# Patient Record
Sex: Male | Born: 1945 | Race: Black or African American | Hispanic: No | Smoking: Former smoker
Health system: Southern US, Community
[De-identification: ages and names within clinical notes are randomized; demographics above are authoritative.]

## PROBLEM LIST (undated history)

## (undated) DIAGNOSIS — I10 Essential (primary) hypertension: Secondary | ICD-10-CM

## (undated) DIAGNOSIS — M5136 Other intervertebral disc degeneration, lumbar region: Secondary | ICD-10-CM

## (undated) DIAGNOSIS — M51369 Other intervertebral disc degeneration, lumbar region without mention of lumbar back pain or lower extremity pain: Secondary | ICD-10-CM

## (undated) DIAGNOSIS — F39 Unspecified mood [affective] disorder: Secondary | ICD-10-CM

## (undated) DIAGNOSIS — M1711 Unilateral primary osteoarthritis, right knee: Secondary | ICD-10-CM

## (undated) DIAGNOSIS — E119 Type 2 diabetes mellitus without complications: Secondary | ICD-10-CM

## (undated) DIAGNOSIS — N281 Cyst of kidney, acquired: Secondary | ICD-10-CM

## (undated) DIAGNOSIS — H269 Unspecified cataract: Secondary | ICD-10-CM

## (undated) DIAGNOSIS — K589 Irritable bowel syndrome without diarrhea: Secondary | ICD-10-CM

## (undated) DIAGNOSIS — E785 Hyperlipidemia, unspecified: Secondary | ICD-10-CM

## (undated) DIAGNOSIS — K219 Gastro-esophageal reflux disease without esophagitis: Secondary | ICD-10-CM

## (undated) DIAGNOSIS — K279 Peptic ulcer, site unspecified, unspecified as acute or chronic, without hemorrhage or perforation: Secondary | ICD-10-CM

## (undated) DIAGNOSIS — N183 Chronic kidney disease, stage 3 unspecified: Secondary | ICD-10-CM

## (undated) DIAGNOSIS — N400 Enlarged prostate without lower urinary tract symptoms: Secondary | ICD-10-CM

## (undated) HISTORY — DX: Type 2 diabetes mellitus without complications: E11.9

## (undated) HISTORY — DX: Unilateral primary osteoarthritis, right knee: M17.11

## (undated) HISTORY — DX: Irritable bowel syndrome, unspecified: K58.9

## (undated) HISTORY — DX: Hyperlipidemia, unspecified: E78.5

## (undated) HISTORY — DX: Chronic kidney disease, stage 3 unspecified: N18.30

## (undated) HISTORY — DX: Benign prostatic hyperplasia without lower urinary tract symptoms: N40.0

## (undated) HISTORY — DX: Peptic ulcer, site unspecified, unspecified as acute or chronic, without hemorrhage or perforation: K27.9

## (undated) HISTORY — PX: PROSTATE SURGERY: SHX751

## (undated) HISTORY — DX: Unspecified mood (affective) disorder: F39

## (undated) HISTORY — DX: Gastro-esophageal reflux disease without esophagitis: K21.9

## (undated) HISTORY — DX: Other intervertebral disc degeneration, lumbar region without mention of lumbar back pain or lower extremity pain: M51.369

## (undated) HISTORY — DX: Essential (primary) hypertension: I10

## (undated) HISTORY — DX: Cyst of kidney, acquired: N28.1

## (undated) HISTORY — DX: Other intervertebral disc degeneration, lumbar region: M51.36

## (undated) HISTORY — DX: Unspecified cataract: H26.9

---

## 2011-04-19 DIAGNOSIS — H25099 Other age-related incipient cataract, unspecified eye: Secondary | ICD-10-CM | POA: Diagnosis not present

## 2011-04-19 DIAGNOSIS — E119 Type 2 diabetes mellitus without complications: Secondary | ICD-10-CM | POA: Diagnosis not present

## 2011-04-19 DIAGNOSIS — H524 Presbyopia: Secondary | ICD-10-CM | POA: Diagnosis not present

## 2011-04-19 DIAGNOSIS — H5200 Hypermetropia, unspecified eye: Secondary | ICD-10-CM | POA: Diagnosis not present

## 2011-08-20 DIAGNOSIS — R3129 Other microscopic hematuria: Secondary | ICD-10-CM | POA: Diagnosis not present

## 2011-08-20 DIAGNOSIS — R350 Frequency of micturition: Secondary | ICD-10-CM | POA: Diagnosis not present

## 2011-08-20 DIAGNOSIS — N400 Enlarged prostate without lower urinary tract symptoms: Secondary | ICD-10-CM | POA: Diagnosis not present

## 2011-08-29 DIAGNOSIS — N401 Enlarged prostate with lower urinary tract symptoms: Secondary | ICD-10-CM | POA: Diagnosis not present

## 2011-08-29 DIAGNOSIS — R350 Frequency of micturition: Secondary | ICD-10-CM | POA: Diagnosis not present

## 2011-11-12 DIAGNOSIS — R071 Chest pain on breathing: Secondary | ICD-10-CM | POA: Diagnosis not present

## 2011-11-12 DIAGNOSIS — M999 Biomechanical lesion, unspecified: Secondary | ICD-10-CM | POA: Diagnosis not present

## 2011-11-15 HISTORY — PX: COLONOSCOPY: SHX174

## 2011-11-19 DIAGNOSIS — S298XXA Other specified injuries of thorax, initial encounter: Secondary | ICD-10-CM | POA: Diagnosis not present

## 2011-11-19 DIAGNOSIS — K279 Peptic ulcer, site unspecified, unspecified as acute or chronic, without hemorrhage or perforation: Secondary | ICD-10-CM | POA: Diagnosis not present

## 2011-11-19 DIAGNOSIS — E119 Type 2 diabetes mellitus without complications: Secondary | ICD-10-CM | POA: Diagnosis not present

## 2011-11-19 DIAGNOSIS — I10 Essential (primary) hypertension: Secondary | ICD-10-CM | POA: Diagnosis not present

## 2011-11-26 DIAGNOSIS — Z86010 Personal history of colonic polyps: Secondary | ICD-10-CM | POA: Diagnosis not present

## 2011-12-10 DIAGNOSIS — H269 Unspecified cataract: Secondary | ICD-10-CM | POA: Insufficient documentation

## 2011-12-10 DIAGNOSIS — E119 Type 2 diabetes mellitus without complications: Secondary | ICD-10-CM | POA: Diagnosis not present

## 2011-12-26 DIAGNOSIS — Z23 Encounter for immunization: Secondary | ICD-10-CM | POA: Diagnosis not present

## 2012-02-21 DIAGNOSIS — I10 Essential (primary) hypertension: Secondary | ICD-10-CM | POA: Diagnosis not present

## 2012-02-21 DIAGNOSIS — K279 Peptic ulcer, site unspecified, unspecified as acute or chronic, without hemorrhage or perforation: Secondary | ICD-10-CM | POA: Diagnosis not present

## 2012-02-21 DIAGNOSIS — E785 Hyperlipidemia, unspecified: Secondary | ICD-10-CM | POA: Diagnosis not present

## 2012-02-21 DIAGNOSIS — E119 Type 2 diabetes mellitus without complications: Secondary | ICD-10-CM | POA: Diagnosis not present

## 2012-02-21 DIAGNOSIS — Z0000 Encounter for general adult medical examination without abnormal findings: Secondary | ICD-10-CM | POA: Diagnosis not present

## 2012-03-03 DIAGNOSIS — E119 Type 2 diabetes mellitus without complications: Secondary | ICD-10-CM | POA: Diagnosis not present

## 2012-03-03 DIAGNOSIS — M205X9 Other deformities of toe(s) (acquired), unspecified foot: Secondary | ICD-10-CM | POA: Diagnosis not present

## 2012-03-03 DIAGNOSIS — B353 Tinea pedis: Secondary | ICD-10-CM | POA: Diagnosis not present

## 2012-03-03 DIAGNOSIS — M79609 Pain in unspecified limb: Secondary | ICD-10-CM | POA: Diagnosis not present

## 2012-03-03 DIAGNOSIS — M2060 Acquired deformities of toe(s), unspecified, unspecified foot: Secondary | ICD-10-CM | POA: Insufficient documentation

## 2012-03-31 DIAGNOSIS — M79609 Pain in unspecified limb: Secondary | ICD-10-CM | POA: Diagnosis not present

## 2012-03-31 DIAGNOSIS — M205X9 Other deformities of toe(s) (acquired), unspecified foot: Secondary | ICD-10-CM | POA: Diagnosis not present

## 2012-03-31 DIAGNOSIS — E119 Type 2 diabetes mellitus without complications: Secondary | ICD-10-CM | POA: Diagnosis not present

## 2012-03-31 DIAGNOSIS — B353 Tinea pedis: Secondary | ICD-10-CM | POA: Diagnosis not present

## 2012-06-23 DIAGNOSIS — H6690 Otitis media, unspecified, unspecified ear: Secondary | ICD-10-CM | POA: Diagnosis not present

## 2012-09-05 DIAGNOSIS — L218 Other seborrheic dermatitis: Secondary | ICD-10-CM | POA: Diagnosis not present

## 2012-09-05 DIAGNOSIS — E119 Type 2 diabetes mellitus without complications: Secondary | ICD-10-CM | POA: Diagnosis not present

## 2012-12-04 DIAGNOSIS — Z23 Encounter for immunization: Secondary | ICD-10-CM | POA: Diagnosis not present

## 2012-12-04 DIAGNOSIS — E119 Type 2 diabetes mellitus without complications: Secondary | ICD-10-CM | POA: Diagnosis not present

## 2012-12-04 DIAGNOSIS — R7309 Other abnormal glucose: Secondary | ICD-10-CM | POA: Diagnosis not present

## 2012-12-04 DIAGNOSIS — E785 Hyperlipidemia, unspecified: Secondary | ICD-10-CM | POA: Diagnosis not present

## 2012-12-08 DIAGNOSIS — N401 Enlarged prostate with lower urinary tract symptoms: Secondary | ICD-10-CM | POA: Diagnosis not present

## 2012-12-08 DIAGNOSIS — R350 Frequency of micturition: Secondary | ICD-10-CM | POA: Diagnosis not present

## 2013-01-05 DIAGNOSIS — H2510 Age-related nuclear cataract, unspecified eye: Secondary | ICD-10-CM | POA: Diagnosis not present

## 2013-01-05 DIAGNOSIS — E119 Type 2 diabetes mellitus without complications: Secondary | ICD-10-CM | POA: Diagnosis not present

## 2013-01-13 DIAGNOSIS — J019 Acute sinusitis, unspecified: Secondary | ICD-10-CM | POA: Diagnosis not present

## 2013-02-04 DIAGNOSIS — Z23 Encounter for immunization: Secondary | ICD-10-CM | POA: Diagnosis not present

## 2013-02-19 DIAGNOSIS — Z79899 Other long term (current) drug therapy: Secondary | ICD-10-CM | POA: Diagnosis not present

## 2013-02-19 DIAGNOSIS — R7309 Other abnormal glucose: Secondary | ICD-10-CM | POA: Diagnosis not present

## 2013-02-19 DIAGNOSIS — E785 Hyperlipidemia, unspecified: Secondary | ICD-10-CM | POA: Diagnosis not present

## 2013-02-19 DIAGNOSIS — E119 Type 2 diabetes mellitus without complications: Secondary | ICD-10-CM | POA: Diagnosis not present

## 2013-02-24 DIAGNOSIS — Z0000 Encounter for general adult medical examination without abnormal findings: Secondary | ICD-10-CM | POA: Diagnosis not present

## 2013-02-24 DIAGNOSIS — E785 Hyperlipidemia, unspecified: Secondary | ICD-10-CM | POA: Diagnosis not present

## 2013-02-24 DIAGNOSIS — I10 Essential (primary) hypertension: Secondary | ICD-10-CM | POA: Diagnosis not present

## 2013-02-24 DIAGNOSIS — E119 Type 2 diabetes mellitus without complications: Secondary | ICD-10-CM | POA: Diagnosis not present

## 2013-03-20 DIAGNOSIS — J209 Acute bronchitis, unspecified: Secondary | ICD-10-CM | POA: Diagnosis not present

## 2013-03-23 DIAGNOSIS — R350 Frequency of micturition: Secondary | ICD-10-CM | POA: Diagnosis not present

## 2013-03-23 DIAGNOSIS — N401 Enlarged prostate with lower urinary tract symptoms: Secondary | ICD-10-CM | POA: Diagnosis not present

## 2013-04-13 DIAGNOSIS — Z01818 Encounter for other preprocedural examination: Secondary | ICD-10-CM | POA: Diagnosis not present

## 2013-04-13 DIAGNOSIS — N401 Enlarged prostate with lower urinary tract symptoms: Secondary | ICD-10-CM | POA: Diagnosis not present

## 2013-04-13 DIAGNOSIS — N400 Enlarged prostate without lower urinary tract symptoms: Secondary | ICD-10-CM | POA: Diagnosis not present

## 2013-04-20 DIAGNOSIS — N138 Other obstructive and reflux uropathy: Secondary | ICD-10-CM | POA: Diagnosis not present

## 2013-04-20 DIAGNOSIS — E119 Type 2 diabetes mellitus without complications: Secondary | ICD-10-CM | POA: Diagnosis not present

## 2013-04-20 DIAGNOSIS — E785 Hyperlipidemia, unspecified: Secondary | ICD-10-CM | POA: Diagnosis not present

## 2013-04-20 DIAGNOSIS — I10 Essential (primary) hypertension: Secondary | ICD-10-CM | POA: Diagnosis not present

## 2013-04-20 DIAGNOSIS — N401 Enlarged prostate with lower urinary tract symptoms: Secondary | ICD-10-CM | POA: Diagnosis not present

## 2013-04-20 DIAGNOSIS — N139 Obstructive and reflux uropathy, unspecified: Secondary | ICD-10-CM | POA: Diagnosis not present

## 2013-04-20 DIAGNOSIS — K219 Gastro-esophageal reflux disease without esophagitis: Secondary | ICD-10-CM | POA: Diagnosis not present

## 2013-04-21 DIAGNOSIS — N139 Obstructive and reflux uropathy, unspecified: Secondary | ICD-10-CM | POA: Diagnosis not present

## 2013-04-21 DIAGNOSIS — N401 Enlarged prostate with lower urinary tract symptoms: Secondary | ICD-10-CM | POA: Diagnosis not present

## 2013-04-21 DIAGNOSIS — E119 Type 2 diabetes mellitus without complications: Secondary | ICD-10-CM | POA: Diagnosis not present

## 2013-04-21 DIAGNOSIS — K219 Gastro-esophageal reflux disease without esophagitis: Secondary | ICD-10-CM | POA: Diagnosis not present

## 2013-04-21 DIAGNOSIS — I10 Essential (primary) hypertension: Secondary | ICD-10-CM | POA: Diagnosis not present

## 2013-04-21 DIAGNOSIS — E785 Hyperlipidemia, unspecified: Secondary | ICD-10-CM | POA: Diagnosis not present

## 2013-04-22 DIAGNOSIS — N139 Obstructive and reflux uropathy, unspecified: Secondary | ICD-10-CM | POA: Diagnosis not present

## 2013-04-22 DIAGNOSIS — N401 Enlarged prostate with lower urinary tract symptoms: Secondary | ICD-10-CM | POA: Diagnosis not present

## 2013-04-22 DIAGNOSIS — E785 Hyperlipidemia, unspecified: Secondary | ICD-10-CM | POA: Diagnosis not present

## 2013-04-22 DIAGNOSIS — E119 Type 2 diabetes mellitus without complications: Secondary | ICD-10-CM | POA: Diagnosis not present

## 2013-04-22 DIAGNOSIS — I10 Essential (primary) hypertension: Secondary | ICD-10-CM | POA: Diagnosis not present

## 2013-04-22 DIAGNOSIS — K219 Gastro-esophageal reflux disease without esophagitis: Secondary | ICD-10-CM | POA: Diagnosis not present

## 2013-05-13 DIAGNOSIS — N138 Other obstructive and reflux uropathy: Secondary | ICD-10-CM | POA: Diagnosis not present

## 2013-05-13 DIAGNOSIS — N401 Enlarged prostate with lower urinary tract symptoms: Secondary | ICD-10-CM | POA: Diagnosis not present

## 2013-07-02 DIAGNOSIS — R7309 Other abnormal glucose: Secondary | ICD-10-CM | POA: Diagnosis not present

## 2013-07-02 DIAGNOSIS — IMO0001 Reserved for inherently not codable concepts without codable children: Secondary | ICD-10-CM | POA: Diagnosis not present

## 2013-07-08 DIAGNOSIS — N401 Enlarged prostate with lower urinary tract symptoms: Secondary | ICD-10-CM | POA: Diagnosis not present

## 2013-07-10 DIAGNOSIS — K5732 Diverticulitis of large intestine without perforation or abscess without bleeding: Secondary | ICD-10-CM | POA: Diagnosis not present

## 2013-09-09 DIAGNOSIS — E119 Type 2 diabetes mellitus without complications: Secondary | ICD-10-CM | POA: Diagnosis not present

## 2013-09-09 DIAGNOSIS — R42 Dizziness and giddiness: Secondary | ICD-10-CM | POA: Diagnosis not present

## 2013-09-09 DIAGNOSIS — H6690 Otitis media, unspecified, unspecified ear: Secondary | ICD-10-CM | POA: Diagnosis not present

## 2013-09-09 DIAGNOSIS — M62838 Other muscle spasm: Secondary | ICD-10-CM | POA: Diagnosis not present

## 2013-12-31 DIAGNOSIS — IMO0001 Reserved for inherently not codable concepts without codable children: Secondary | ICD-10-CM | POA: Diagnosis not present

## 2014-01-11 DIAGNOSIS — H2510 Age-related nuclear cataract, unspecified eye: Secondary | ICD-10-CM | POA: Diagnosis not present

## 2014-01-11 DIAGNOSIS — E119 Type 2 diabetes mellitus without complications: Secondary | ICD-10-CM | POA: Diagnosis not present

## 2014-01-13 DIAGNOSIS — R351 Nocturia: Secondary | ICD-10-CM | POA: Diagnosis not present

## 2014-01-13 DIAGNOSIS — N138 Other obstructive and reflux uropathy: Secondary | ICD-10-CM | POA: Diagnosis not present

## 2014-01-13 DIAGNOSIS — N401 Enlarged prostate with lower urinary tract symptoms: Secondary | ICD-10-CM | POA: Diagnosis not present

## 2014-01-28 DIAGNOSIS — Z23 Encounter for immunization: Secondary | ICD-10-CM | POA: Diagnosis not present

## 2014-02-11 DIAGNOSIS — M5136 Other intervertebral disc degeneration, lumbar region: Secondary | ICD-10-CM | POA: Diagnosis not present

## 2014-03-08 DIAGNOSIS — A499 Bacterial infection, unspecified: Secondary | ICD-10-CM | POA: Diagnosis not present

## 2014-03-08 DIAGNOSIS — J329 Chronic sinusitis, unspecified: Secondary | ICD-10-CM | POA: Diagnosis not present

## 2014-04-14 DIAGNOSIS — R351 Nocturia: Secondary | ICD-10-CM | POA: Diagnosis not present

## 2014-04-14 DIAGNOSIS — N400 Enlarged prostate without lower urinary tract symptoms: Secondary | ICD-10-CM | POA: Diagnosis not present

## 2014-04-14 DIAGNOSIS — N529 Male erectile dysfunction, unspecified: Secondary | ICD-10-CM | POA: Diagnosis not present

## 2014-04-15 DIAGNOSIS — M1711 Unilateral primary osteoarthritis, right knee: Secondary | ICD-10-CM | POA: Diagnosis not present

## 2014-06-01 DIAGNOSIS — E860 Dehydration: Secondary | ICD-10-CM | POA: Diagnosis not present

## 2014-06-01 DIAGNOSIS — I10 Essential (primary) hypertension: Secondary | ICD-10-CM | POA: Diagnosis not present

## 2014-06-01 DIAGNOSIS — R002 Palpitations: Secondary | ICD-10-CM | POA: Diagnosis not present

## 2014-06-01 DIAGNOSIS — R42 Dizziness and giddiness: Secondary | ICD-10-CM | POA: Diagnosis not present

## 2014-06-01 DIAGNOSIS — Z79899 Other long term (current) drug therapy: Secondary | ICD-10-CM | POA: Diagnosis not present

## 2014-06-01 DIAGNOSIS — Z5181 Encounter for therapeutic drug level monitoring: Secondary | ICD-10-CM | POA: Diagnosis not present

## 2014-06-01 DIAGNOSIS — R0602 Shortness of breath: Secondary | ICD-10-CM | POA: Diagnosis not present

## 2014-06-01 DIAGNOSIS — R079 Chest pain, unspecified: Secondary | ICD-10-CM | POA: Diagnosis not present

## 2014-06-01 DIAGNOSIS — R000 Tachycardia, unspecified: Secondary | ICD-10-CM | POA: Diagnosis not present

## 2014-06-01 DIAGNOSIS — E119 Type 2 diabetes mellitus without complications: Secondary | ICD-10-CM | POA: Diagnosis not present

## 2014-06-04 DIAGNOSIS — R002 Palpitations: Secondary | ICD-10-CM | POA: Diagnosis not present

## 2014-09-15 DIAGNOSIS — E785 Hyperlipidemia, unspecified: Secondary | ICD-10-CM | POA: Diagnosis not present

## 2014-09-15 DIAGNOSIS — E1165 Type 2 diabetes mellitus with hyperglycemia: Secondary | ICD-10-CM | POA: Diagnosis not present

## 2014-09-16 DIAGNOSIS — M5136 Other intervertebral disc degeneration, lumbar region: Secondary | ICD-10-CM | POA: Diagnosis not present

## 2014-11-02 DIAGNOSIS — M170 Bilateral primary osteoarthritis of knee: Secondary | ICD-10-CM | POA: Diagnosis not present

## 2014-11-02 DIAGNOSIS — M25561 Pain in right knee: Secondary | ICD-10-CM | POA: Diagnosis not present

## 2015-01-10 DIAGNOSIS — H2513 Age-related nuclear cataract, bilateral: Secondary | ICD-10-CM | POA: Diagnosis not present

## 2015-01-10 DIAGNOSIS — E119 Type 2 diabetes mellitus without complications: Secondary | ICD-10-CM | POA: Diagnosis not present

## 2015-02-16 DIAGNOSIS — Z23 Encounter for immunization: Secondary | ICD-10-CM | POA: Diagnosis not present

## 2015-03-03 ENCOUNTER — Encounter: Payer: Self-pay | Admitting: Internal Medicine

## 2015-03-12 ENCOUNTER — Encounter: Payer: Self-pay | Admitting: Internal Medicine

## 2015-03-12 ENCOUNTER — Other Ambulatory Visit: Payer: Self-pay | Admitting: Internal Medicine

## 2015-03-12 DIAGNOSIS — E119 Type 2 diabetes mellitus without complications: Secondary | ICD-10-CM

## 2015-03-12 DIAGNOSIS — E785 Hyperlipidemia, unspecified: Secondary | ICD-10-CM | POA: Insufficient documentation

## 2015-03-12 DIAGNOSIS — K279 Peptic ulcer, site unspecified, unspecified as acute or chronic, without hemorrhage or perforation: Secondary | ICD-10-CM | POA: Insufficient documentation

## 2015-03-12 DIAGNOSIS — E1169 Type 2 diabetes mellitus with other specified complication: Secondary | ICD-10-CM | POA: Insufficient documentation

## 2015-03-12 DIAGNOSIS — E118 Type 2 diabetes mellitus with unspecified complications: Secondary | ICD-10-CM | POA: Insufficient documentation

## 2015-03-12 DIAGNOSIS — N400 Enlarged prostate without lower urinary tract symptoms: Secondary | ICD-10-CM | POA: Insufficient documentation

## 2015-03-12 DIAGNOSIS — N529 Male erectile dysfunction, unspecified: Secondary | ICD-10-CM | POA: Insufficient documentation

## 2015-03-12 DIAGNOSIS — L210 Seborrhea capitis: Secondary | ICD-10-CM | POA: Insufficient documentation

## 2015-03-12 DIAGNOSIS — N138 Other obstructive and reflux uropathy: Secondary | ICD-10-CM | POA: Insufficient documentation

## 2015-03-12 DIAGNOSIS — K589 Irritable bowel syndrome without diarrhea: Secondary | ICD-10-CM | POA: Insufficient documentation

## 2015-03-12 DIAGNOSIS — I10 Essential (primary) hypertension: Secondary | ICD-10-CM | POA: Insufficient documentation

## 2015-03-31 DIAGNOSIS — E1165 Type 2 diabetes mellitus with hyperglycemia: Secondary | ICD-10-CM | POA: Diagnosis not present

## 2015-04-08 ENCOUNTER — Ambulatory Visit (INDEPENDENT_AMBULATORY_CARE_PROVIDER_SITE_OTHER): Payer: Medicare Other | Admitting: Internal Medicine

## 2015-04-08 ENCOUNTER — Encounter: Payer: Self-pay | Admitting: Internal Medicine

## 2015-04-08 VITALS — BP 110/60 | HR 88 | Temp 97.4°F | Ht 71.0 in | Wt 199.0 lb

## 2015-04-08 DIAGNOSIS — J0110 Acute frontal sinusitis, unspecified: Secondary | ICD-10-CM | POA: Diagnosis not present

## 2015-04-08 DIAGNOSIS — H109 Unspecified conjunctivitis: Secondary | ICD-10-CM

## 2015-04-08 MED ORDER — NEOMYCIN-POLYMYXIN-DEXAMETH 3.5-10000-0.1 OP SUSP: 2.0000 [drp] | Freq: Four times a day (QID) | OPHTHALMIC | Status: DC

## 2015-04-08 MED ORDER — AMOXICILLIN-POT CLAVULANATE 875-125 MG PO TABS: 1.0000 | ORAL_TABLET | Freq: Two times a day (BID) | ORAL | Status: DC

## 2015-04-08 NOTE — Progress Notes (Signed)
Date:  04/08/2015   Name:  Marc Bond   DOB:  [birth year]   MRN:  [id number]   Chief Complaint: Sinusitis and Cough Sinusitis This is a new problem. The current episode started in the past 7 days. The problem is unchanged. There has been no fever. Associated symptoms include congestion, coughing, sinus pressure and swollen glands. Pertinent negatives include no ear pain or sore throat. Past treatments include nothing.  Cough Associated symptoms include eye redness and postnasal drip. Pertinent negatives include no chest pain, ear pain, fever, sore throat or wheezing.  Conjunctivitis  The current episode started yesterday. The onset was sudden. The problem occurs continuously. The problem is moderate. Associated symptoms include congestion, swollen glands, cough, eye discharge and eye redness. Pertinent negatives include no fever, no ear pain, no hearing loss, no sore throat and no wheezing.    Review of Systems  Constitutional: Negative for fever.  HENT: Positive for congestion, postnasal drip and sinus pressure. Negative for ear pain, hearing loss, sore throat, tinnitus and trouble swallowing.   Eyes: Positive for discharge and redness.  Respiratory: Positive for cough. Negative for choking, chest tightness and wheezing.   Cardiovascular: Negative for chest pain, palpitations and leg swelling.    Patient Active Problem List   Diagnosis Date Noted  . Irritable colon 03/12/2015  . Controlled type 2 diabetes mellitus without complication (HCC) 03/12/2015  . Dyslipidemia 03/12/2015  . Enlarged prostate 03/12/2015  . Essential (primary) hypertension 03/12/2015  . Failure of erection 03/12/2015  . Seborrhea capitis 03/12/2015  . Gastroduodenal ulcer 03/12/2015  . HLD (hyperlipidemia) 12/04/2012  . Acquired deformities of toe 03/03/2012  . Bilateral cataracts 12/10/2011    Prior to Admission medications   Medication Sig Start Date End Date Taking? Authorizing Provider  celecoxib  (CELEBREX) 200 MG capsule Take 1 capsule by mouth daily. 07/15/14  Yes Historical Provider, MD  dicyclomine (BENTYL) 10 MG capsule Take 1 capsule by mouth 3 (three) times daily. 04/05/14  Yes Historical Provider, MD  fluticasone (FLONASE) 50 MCG/ACT nasal spray Place 2 sprays into the nose daily. 06/23/12  Yes Historical Provider, MD  glucose blood (ONETOUCH VERIO) test strip ONETOUCH VERIO (In Vitro Strip) - Historical Medication  1 daily Active   Yes Historical Provider, MD  MULTIPLE VITAMINS PO Take 1 tablet by mouth daily.   Yes Historical Provider, MD  omeprazole (PRILOSEC) 40 MG capsule Take 1 capsule by mouth daily. 06/30/14  Yes Historical Provider, MD  ramipril (ALTACE) 2.5 MG capsule Take 1 capsule by mouth daily. 07/15/14  Yes Historical Provider, MD  simvastatin (ZOCOR) 40 MG tablet Take 1 tablet by mouth at bedtime.   Yes Historical Provider, MD  sitaGLIPtin-metformin (JANUMET) 50-1000 MG tablet Take 1 tablet by mouth 2 (two) times daily.   Yes Historical Provider, MD  tadalafil (CIALIS) 10 MG tablet Take by mouth. 05/22/12  Yes Historical Provider, MD    No Known Allergies  Past Surgical History  Procedure Laterality Date  . Colonoscopy  2013    Social History  Substance Use Topics  . Smoking status: Former Games developer  . Smokeless tobacco: None  . Alcohol Use: No     Medication list has been reviewed and updated.   Physical Exam  Constitutional: He is oriented to person, place, and time. He appears well-developed and well-nourished.  HENT:  Right Ear: External ear and ear canal normal. Tympanic membrane is not erythematous and not retracted.  Left Ear: External ear and ear canal normal. Tympanic  membrane is not erythematous and not retracted.  Nose: Right sinus exhibits maxillary sinus tenderness and frontal sinus tenderness. Left sinus exhibits maxillary sinus tenderness and frontal sinus tenderness.  Mouth/Throat: Uvula is midline and mucous membranes are normal. No oral  lesions. No oropharyngeal exudate or posterior oropharyngeal erythema.  Eyes: Right eye exhibits discharge. Left eye exhibits discharge. Right conjunctiva is injected. Left conjunctiva is injected. Right eye exhibits normal extraocular motion and no nystagmus. Left eye exhibits normal extraocular motion and no nystagmus.  Cardiovascular: Normal rate, regular rhythm and normal heart sounds.   Pulmonary/Chest: Effort normal and breath sounds normal. He has no wheezes. He has no rales.  Lymphadenopathy:    He has no cervical adenopathy.  Neurological: He is alert and oriented to person, place, and time.    BP 110/60 mmHg  Pulse 88  Temp(Src) 97.4 F (36.3 C)  Wt 199 lb (90.266 kg)  SpO2 100%  Assessment and Plan: 1. Acute frontal sinusitis, recurrence not specified Continue fluids - amoxicillin-clavulanate (AUGMENTIN) 875-125 MG tablet; Take 1 tablet by mouth 2 (two) times daily.  Dispense: 20 tablet; Refill: 0  2. Bilateral conjunctivitis - neomycin-polymyxin b-dexamethasone (MAXITROL) 3.5-10000-0.1 SUSP; Place 2 drops into both eyes every 6 (six) hours.  Dispense: 5 mL; Refill: 0   Bari EdwardLaura Keyairra Kolinski, MD [facility]  04/08/2015

## 2015-06-10 ENCOUNTER — Other Ambulatory Visit: Payer: Self-pay | Admitting: Internal Medicine

## 2015-06-10 ENCOUNTER — Telehealth: Payer: Self-pay

## 2015-06-10 MED ORDER — OSELTAMIVIR PHOSPHATE 75 MG PO CAPS: 75.0000 mg | ORAL_CAPSULE | Freq: Two times a day (BID) | ORAL | Status: DC

## 2015-06-10 NOTE — Telephone Encounter (Signed)
Patient girlfriend called in today and states that patients grandson whom shares the same bed with him tested positive for the flu yesterday. They would tamiflu called in for the patient just in case.

## 2015-06-17 ENCOUNTER — Other Ambulatory Visit: Payer: Self-pay | Admitting: Internal Medicine

## 2015-06-24 DIAGNOSIS — M1711 Unilateral primary osteoarthritis, right knee: Secondary | ICD-10-CM | POA: Diagnosis not present

## 2015-06-28 DIAGNOSIS — M1711 Unilateral primary osteoarthritis, right knee: Secondary | ICD-10-CM | POA: Diagnosis not present

## 2015-06-29 ENCOUNTER — Encounter: Payer: Self-pay | Admitting: Internal Medicine

## 2015-09-01 DIAGNOSIS — M1711 Unilateral primary osteoarthritis, right knee: Secondary | ICD-10-CM | POA: Diagnosis not present

## 2015-09-29 ENCOUNTER — Other Ambulatory Visit: Payer: Self-pay | Admitting: Internal Medicine

## 2015-09-29 ENCOUNTER — Ambulatory Visit (INDEPENDENT_AMBULATORY_CARE_PROVIDER_SITE_OTHER): Payer: Medicare Other | Admitting: Internal Medicine

## 2015-09-29 ENCOUNTER — Encounter: Payer: Self-pay | Admitting: Internal Medicine

## 2015-09-29 VITALS — BP 116/76 | HR 84 | Resp 16 | Ht 71.0 in | Wt 196.1 lb

## 2015-09-29 DIAGNOSIS — M1711 Unilateral primary osteoarthritis, right knee: Secondary | ICD-10-CM | POA: Diagnosis not present

## 2015-09-29 DIAGNOSIS — M5136 Other intervertebral disc degeneration, lumbar region: Secondary | ICD-10-CM | POA: Diagnosis not present

## 2015-09-29 DIAGNOSIS — Z0000 Encounter for general adult medical examination without abnormal findings: Secondary | ICD-10-CM | POA: Diagnosis not present

## 2015-09-29 DIAGNOSIS — N400 Enlarged prostate without lower urinary tract symptoms: Secondary | ICD-10-CM | POA: Diagnosis not present

## 2015-09-29 DIAGNOSIS — K279 Peptic ulcer, site unspecified, unspecified as acute or chronic, without hemorrhage or perforation: Secondary | ICD-10-CM

## 2015-09-29 DIAGNOSIS — Z23 Encounter for immunization: Secondary | ICD-10-CM | POA: Diagnosis not present

## 2015-09-29 DIAGNOSIS — E119 Type 2 diabetes mellitus without complications: Secondary | ICD-10-CM | POA: Diagnosis not present

## 2015-09-29 DIAGNOSIS — E785 Hyperlipidemia, unspecified: Secondary | ICD-10-CM

## 2015-09-29 LAB — POC URINALYSIS WITH MICROSCOPIC (NON AUTO)MANUAL RESULT
BILIRUBIN UA: NEGATIVE
Bacteria, UA: 0
Crystals: 0
EPITHELIAL CELLS, URINE PER MICROSCOPY: 0
GLUCOSE UA: NEGATIVE
KETONES UA: NEGATIVE
Leukocytes, UA: NEGATIVE
Mucus, UA: 0
NITRITE UA: NEGATIVE
PH UA: 6.5
Protein, UA: NEGATIVE
RBC: 0 M/uL — AB (ref 4.69–6.13)
Spec Grav, UA: 1.02
UROBILINOGEN UA: 0.2
WBC CASTS UA: 0

## 2015-09-29 NOTE — Patient Instructions (Addendum)
Pneumococcal Conjugate Vaccine (PCV13)  1. Why get vaccinated? Vaccination can protect both children and adults from pneumococcal disease. Pneumococcal disease is caused by bacteria that can spread from person to person through close contact. It can cause ear infections, and it can also lead to more serious infections of the:  Lungs (pneumonia),  Blood (bacteremia), and  Covering of the brain and spinal cord (meningitis). Pneumococcal pneumonia is most common among adults. Pneumococcal meningitis can cause deafness and brain damage, and it kills about 1 child in 10 who get it. Anyone can get pneumococcal disease, but children under 28 years of age and adults 43 years and older, people with certain medical conditions, and cigarette smokers are at the highest risk. Before there was a vaccine, the [place] saw:  more than 700 cases of meningitis,  about 13,000 blood infections,  about 5 million ear infections, and  about 200 deaths in children under 5 each year from pneumococcal disease. Since vaccine became available, severe pneumococcal disease in these children has fallen by 88%. About 18,000 older adults die of pneumococcal disease each year in the [place]. Treatment of pneumococcal infections with penicillin and other drugs is not as effective as it used to be, because some strains of the disease have become resistant to these drugs. This makes prevention of the disease, through vaccination, even more important. 2. PCV13 vaccine Pneumococcal conjugate vaccine (called PCV13) protects against 13 types of pneumococcal bacteria. PCV13 is routinely given to children at 2, 4, 6, and 65-74 months of age. It is also recommended for children and adults 70 to 70 years of age with certain health conditions, and for all adults 64 years of age and older. Your doctor can give you details. 3. Some people should not get this vaccine Anyone who has ever had a life-threatening allergic reaction  to a dose of this vaccine, to an earlier pneumococcal vaccine called PCV7, or to any vaccine containing diphtheria toxoid (for example, DTaP), should not get PCV13. Anyone with a severe allergy to any component of PCV13 should not get the vaccine. Tell your doctor if the person being vaccinated has any severe allergies. If the person scheduled for vaccination is not feeling well, your healthcare provider might decide to reschedule the shot on another day. 4. Risks of a vaccine reaction With any medicine, including vaccines, there is a chance of reactions. These are usually mild and go away on their own, but serious reactions are also possible. Problems reported following PCV13 varied by age and dose in the series. The most common problems reported among children were:  About half became drowsy after the shot, had a temporary loss of appetite, or had redness or tenderness where the shot was given.  About 1 out of 3 had swelling where the shot was given.  About 1 out of 3 had a mild fever, and about 1 in 20 had a fever over 102.55F.  Up to about 8 out of 10 became fussy or irritable. Adults have reported pain, redness, and swelling where the shot was given; also mild fever, fatigue, headache, chills, or muscle pain. Young children who get PCV13 along with inactivated flu vaccine at the same time may be at increased risk for seizures caused by fever. Ask your doctor for more information. Problems that could happen after any vaccine:  People sometimes faint after a medical procedure, including vaccination. Sitting or lying down for about 15 minutes can help prevent fainting, and injuries caused by a fall.  Tell your doctor if you feel dizzy, or have vision changes or ringing in the ears.  Some older children and adults get severe pain in the shoulder and have difficulty moving the arm where a shot was given. This happens very rarely.  Any medication can cause a severe allergic reaction. Such  reactions from a vaccine are very rare, estimated at about 1 in a million doses, and would happen within a few minutes to a few hours after the vaccination. As with any medicine, there is a very small chance of a vaccine causing a serious injury or death. The safety of vaccines is always being monitored. For more information, visit: http://floyd.org/www.cdc.gov/vaccinesafety/ 5. What if there is a serious reaction? What should I look for?  Look for anything that concerns you, such as signs of a severe allergic reaction, very high fever, or unusual behavior. Signs of a severe allergic reaction can include hives, swelling of the face and throat, difficulty breathing, a fast heartbeat, dizziness, and weakness-usually within a few minutes to a few hours after the vaccination. What should I do?  If you think it is a severe allergic reaction or other emergency that can't wait, call 9-1-1 or get the person to the nearest hospital. Otherwise, call your doctor. Reactions should be reported to the Vaccine Adverse Event Reporting System (VAERS). Your doctor should file this report, or you can do it yourself through the VAERS web site at www.vaers.LAgents.nohhs.gov, or by calling 1-562-661-7113. VAERS does not give medical advice. 6. The National Vaccine Injury Compensation Program The [employer] Vaccine Injury Compensation Program (VICP) is a federal program that was created to compensate people who may have been injured by certain vaccines. Persons who believe they may have been injured by a vaccine can learn about the program and about filing a claim by calling 1-(657)623-0557 or visiting the VICP website at SpiritualWord.atwww.hrsa.gov/vaccinecompensation. There is a time limit to file a claim for compensation. 7. How can I learn more?  Ask your healthcare provider. He or she can give you the vaccine package insert or suggest other sources of information.  Call your local or state health department.  Contact the Centers for Disease Control and  Prevention (CDC):  Call 786-411-74581-5201040246 (1-800-CDC-INFO) or  Visit CDC's website at PicCapture.uywww.cdc.gov/vaccines Vaccine Information Statement PCV13 Vaccine (02/18/2014)   This information is not intended to replace advice given to you by your health care provider. Make sure you discuss any questions you have with your health care provider.   Document Released: 01/28/2006 Document Revised: 04/23/2014 Document Reviewed: 02/25/2014 Elsevier Interactive Patient Education 2016 [employer]  Health Maintenance  Topic Date Due  . Janet BerlinETANUS/TDAP  07/12/1963  . ZOSTAVAX  07/11/2004  . PNA vac Low Risk Adult (1 of 2 - PCV13) 07/11/2009  . INFLUENZA VACCINE  11/15/2015  . OPHTHALMOLOGY EXAM  01/10/2016  . HEMOGLOBIN A1C  03/30/2016  . FOOT EXAM  09/28/2016  . COLONOSCOPY  11/25/2021  . Hepatitis C Screening  Addressed

## 2015-09-29 NOTE — Progress Notes (Signed)
Patient: Marc Bond, Male    DOB: Apr 27, 1945, 70 y.o.   MRN: [id number] Visit Date: 09/29/2015  Today's Provider: Bari Edward, MD   Chief Complaint  Patient presents with  . Medicare Wellness  . Diabetes  . Hypertension  . Hyperlipidemia   Subjective:    Annual wellness visit Marc Bond is a 70 y.o. male who presents today for his Subsequent Annual Wellness Visit. He feels well. He reports exercising very little due to OA knee. He reports he is sleeping well.   ----------------------------------------------------------- Diabetes He presents for his follow-up diabetic visit. He has type 2 (followed by [facility]) diabetes mellitus. His disease course has been stable. Pertinent negatives for hypoglycemia include no dizziness or headaches. Pertinent negatives for diabetes include no chest pain and no fatigue. There are no hypoglycemic complications. Current diabetic treatment includes oral agent (dual therapy). He is compliant with treatment most of the time.  Hypertension This is a chronic problem. The current episode started more than 1 year ago. The problem is unchanged. The problem is controlled. Pertinent negatives include no chest pain, headaches, palpitations or shortness of breath. Past treatments include ACE inhibitors. The current treatment provides moderate improvement.  Hyperlipidemia This is a chronic problem. The current episode started more than 1 year ago. The problem is controlled. Recent lipid tests were reviewed and are normal. Pertinent negatives include no chest pain, myalgias or shortness of breath. Current antihyperlipidemic treatment includes statins.  OA Knee - more painful recently but no trauma.  He was seen by Ortho and received a joint injection.  He continues on Celebrex daily, for knee pain and lumbar DDD.  Review of Systems  Constitutional: Negative for fever, chills, diaphoresis, appetite change, fatigue and unexpected weight change.  HENT:  Negative for hearing loss, tinnitus, trouble swallowing and voice change.   Eyes: Negative for visual disturbance.  Respiratory: Negative for cough, choking, chest tightness, shortness of breath and wheezing.   Cardiovascular: Negative for chest pain, palpitations and leg swelling.  Gastrointestinal: Negative for abdominal pain, diarrhea, constipation and blood in stool.  Genitourinary: Negative for dysuria, frequency and difficulty urinating.  Musculoskeletal: Negative for myalgias, back pain and arthralgias.  Skin: Negative for color change and rash.  Neurological: Negative for dizziness, syncope and headaches.  Hematological: Negative for adenopathy.  Psychiatric/Behavioral: Negative for sleep disturbance and dysphoric mood.    Social History   Social History  . Marital Status: Married    Spouse Name: N/A  . Number of Children: N/A  . Years of Education: N/A   Occupational History  . Not on file.   Social History Main Topics  . Smoking status: Former Games developer  . Smokeless tobacco: Not on file  . Alcohol Use: No  . Drug Use: Not on file  . Sexual Activity: Not on file   Other Topics Concern  . Not on file   Social History Narrative    Patient Active Problem List   Diagnosis Date Noted  . Degenerative disc disease, lumbar 09/29/2015  . Irritable colon 03/12/2015  . Controlled type 2 diabetes mellitus without complication (HCC) 03/12/2015  . Dyslipidemia 03/12/2015  . Enlarged prostate 03/12/2015  . Essential (primary) hypertension 03/12/2015  . Failure of erection 03/12/2015  . Seborrhea capitis 03/12/2015  . Gastroduodenal ulcer 03/12/2015  . Acquired deformities of toe 03/03/2012  . Bilateral cataracts 12/10/2011    Past Surgical History  Procedure Laterality Date  . Colonoscopy  2013    His family history includes Hypertension in  his sister.    Previous Medications   CELECOXIB (CELEBREX) 200 MG CAPSULE    TAKE ONE CAPSULE BY MOUTH EVERY DAY   DICYCLOMINE  (BENTYL) 10 MG CAPSULE    take 1 capsule by mouth three times a day   GLUCOSE BLOOD (ONETOUCH VERIO) TEST STRIP    ONETOUCH VERIO (In Vitro Strip) - Historical Medication  1 daily Active   MULTIPLE VITAMINS PO    Take 1 tablet by mouth daily.   OMEPRAZOLE (PRILOSEC) 40 MG CAPSULE    TAKE ONE CAPSULE BY MOUTH EVERY DAY   RAMIPRIL (ALTACE) 2.5 MG CAPSULE    Take 1 capsule by mouth daily.   SIMVASTATIN (ZOCOR) 40 MG TABLET    Take 1 tablet by mouth at bedtime.   SITAGLIPTIN-METFORMIN (JANUMET) 50-1000 MG TABLET    Take 1 tablet by mouth 2 (two) times daily.   TADALAFIL (CIALIS) 10 MG TABLET    Take by mouth.    Patient Care Team: Jeronimo NormaNicole Jelesoff as Physician Assistant (Endocrinology)     Objective:   Vitals: BP 116/76 mmHg  Pulse 84  Resp 16  Ht 5\' 11"  (1.803 m)  Wt 196 lb 1.6 oz (88.95 kg)  BMI 27.36 kg/m2  SpO2 98%  Physical Exam  Constitutional: He is oriented to person, place, and time. He appears well-developed and well-nourished.  HENT:  Head: Normocephalic.  Right Ear: Tympanic membrane, external ear and ear canal normal.  Left Ear: Tympanic membrane, external ear and ear canal normal.  Nose: Nose normal.  Mouth/Throat: Uvula is midline and oropharynx is clear and moist.  Eyes: Conjunctivae and EOM are normal. Pupils are equal, round, and reactive to light.  Neck: Normal range of motion. Neck supple. Carotid bruit is not present. No thyromegaly present.  Cardiovascular: Normal rate, regular rhythm, normal heart sounds and intact distal pulses.   Pulmonary/Chest: Effort normal and breath sounds normal. He has no wheezes. Right breast exhibits no mass. Left breast exhibits no mass.  Abdominal: Soft. Normal appearance and bowel sounds are normal. There is no hepatosplenomegaly. There is no tenderness. A hernia (small reducible umbilical hernia) is present.  Musculoskeletal: Normal range of motion.  Lymphadenopathy:    He has no cervical adenopathy.  Neurological: He is  alert and oriented to person, place, and time. He has normal reflexes.  Foot exam:  Normal sensation, skin, and pulses.  Fungal nail thickening both great toes without pain  Skin: Skin is warm, dry and intact.  Psychiatric: He has a normal mood and affect. His speech is normal and behavior is normal. Judgment and thought content normal.  Nursing note and vitals reviewed.   Activities of Daily Living In your present state of health, do you have any difficulty performing the following activities: 09/29/2015 04/08/2015  Hearing? N N  Vision? N N  Difficulty concentrating or making decisions? N N  Walking or climbing stairs? N N  Dressing or bathing? N N  Doing errands, shopping? N N  Preparing Food and eating ? N -  Using the Toilet? N -  In the past six months, have you accidently leaked urine? N -  Do you have problems with loss of bowel control? N -  Managing your Medications? N -  Managing your Finances? N -  Housekeeping or managing your Housekeeping? N -    Fall Risk Assessment Fall Risk  09/29/2015 04/08/2015  Falls in the past year? No No      Depression Screen PHQ 2/9 Scores 09/29/2015  04/08/2015  PHQ - 2 Score 0 0    Cognitive Testing - 6-CIT   Correct? Score   What year is it? yes 0 Yes = 0    No = 4  What month is it? yes 0 Yes = 0    No = 3  Remember:     Floyde Parkins, [address], [place]     What time is it? yes 0 Yes = 0    No = 3  Count backwards from 20 to 1 yes 0 Correct = 0    1 error = 2   More than 1 error = 4  Say the months of the year in reverse. yes 0 Correct = 0    1 error = 2   More than 1 error = 4  What address did I ask you to remember? yes 0 Correct = 0  1 error = 2    2 error = 4    3 error = 6    4 error = 8    All wrong = 10       TOTAL SCORE  0/28   Interpretation:  Normal  Normal (0-7) Abnormal (8-28)        Medicare Annual Wellness Visit Summary:  Reviewed patient's Family Medical History Reviewed and updated list of patient's  medical providers Assessment of cognitive impairment was done Assessed patient's functional ability Established a written schedule for health screening services Health Risk Assessent Completed and Reviewed  Exercise Activities and Dietary recommendations Goals    None      Immunization History  Administered Date(s) Administered  . Influenza-Unspecified 02/16/2015  . Pneumococcal Polysaccharide-23 04/17/2008    Health Maintenance  Topic Date Due  . TETANUS/TDAP  07/12/1963  . ZOSTAVAX  07/11/2004  . PNA vac Low Risk Adult (1 of 2 - PCV13) 07/11/2009  . HEMOGLOBIN A1C  03/17/2015  . INFLUENZA VACCINE  11/15/2015  . OPHTHALMOLOGY EXAM  01/10/2016  . FOOT EXAM  09/28/2016  . COLONOSCOPY  11/25/2021  . Hepatitis C Screening  Addressed     Discussed health benefits of physical activity, and encouraged him to engage in regular exercise appropriate for his age and condition.    ------------------------------------------------------------------------------------------------------------   Assessment & Plan:  1. Medicare annual wellness visit, subsequent Measures satisfied Discussed TDaP at next visit Rx for Zostavax given  2. Gastroduodenal ulcer No evidence of recurrence - continue daily PPI - CBC with Differential/Platelet  3. Controlled type 2 diabetes mellitus without complication, without long-term current use of insulin (HCC) Followed by Endo but no recent A1C Patient reminded to schedule annual eye exam - Comprehensive metabolic panel - Hemoglobin A1c - TSH  4. Dyslipidemia On statin therapy - Lipid panel  5. Degenerative disc disease, lumbar Continue celebrex but monitor renal function closely  6. Need for pneumococcal vaccination - Pneumococcal conjugate vaccine 13-valent IM  7. Enlarged prostate Followed by Urology with no change in symptoms - PSA   Bari Edward, MD [facility]  09/29/2015

## 2015-09-30 LAB — CBC WITH DIFFERENTIAL/PLATELET
Basophils Absolute: 0 10*3/uL (ref 0.0–0.2)
Basos: 0 %
EOS (ABSOLUTE): 0.1 10*3/uL (ref 0.0–0.4)
EOS: 1 %
HEMOGLOBIN: 13.3 g/dL (ref 12.6–17.7)
Hematocrit: 40.6 % (ref 37.5–51.0)
Immature Grans (Abs): 0 10*3/uL (ref 0.0–0.1)
Immature Granulocytes: 0 %
LYMPHS ABS: 2.1 10*3/uL (ref 0.7–3.1)
Lymphs: 24 %
MCH: 27.4 pg (ref 26.6–33.0)
MCHC: 32.8 g/dL (ref 31.5–35.7)
MCV: 84 fL (ref 79–97)
MONOCYTES: 8 %
Monocytes Absolute: 0.7 10*3/uL (ref 0.1–0.9)
NEUTROS ABS: 5.8 10*3/uL (ref 1.4–7.0)
Neutrophils: 67 %
PLATELETS: 237 10*3/uL (ref 150–379)
RBC: 4.85 x10E6/uL (ref 4.14–5.80)
RDW: 13.6 % (ref 12.3–15.4)
WBC: 8.8 10*3/uL (ref 3.4–10.8)

## 2015-09-30 LAB — COMPREHENSIVE METABOLIC PANEL
ALK PHOS: 82 IU/L (ref 39–117)
ALT: 23 IU/L (ref 0–44)
AST: 23 IU/L (ref 0–40)
Albumin/Globulin Ratio: 1.1 — ABNORMAL LOW (ref 1.2–2.2)
Albumin: 4.1 g/dL (ref 3.5–4.8)
BILIRUBIN TOTAL: 0.3 mg/dL (ref 0.0–1.2)
BUN/Creatinine Ratio: 13 (ref 10–24)
BUN: 15 mg/dL (ref 8–27)
CHLORIDE: 100 mmol/L (ref 96–106)
CO2: 19 mmol/L (ref 18–29)
CREATININE: 1.12 mg/dL (ref 0.76–1.27)
Calcium: 9.6 mg/dL (ref 8.6–10.2)
GFR calc Af Amer: 76 mL/min/{1.73_m2} (ref >59)
GFR calc non Af Amer: 66 mL/min/{1.73_m2} (ref >59)
GLUCOSE: 116 mg/dL — AB (ref 65–99)
Globulin, Total: 3.7 g/dL (ref 1.5–4.5)
Potassium: 4.9 mmol/L (ref 3.5–5.2)
Sodium: 136 mmol/L (ref 134–144)
Total Protein: 7.8 g/dL (ref 6.0–8.5)

## 2015-09-30 LAB — LIPID PANEL
CHOL/HDL RATIO: 3 ratio (ref 0.0–5.0)
CHOLESTEROL TOTAL: 164 mg/dL (ref 100–199)
HDL: 54 mg/dL (ref >39)
LDL Calculated: 95 mg/dL (ref 0–99)
TRIGLYCERIDES: 74 mg/dL (ref 0–149)
VLDL Cholesterol Cal: 15 mg/dL (ref 5–40)

## 2015-09-30 LAB — PSA: Prostate Specific Ag, Serum: 1.6 ng/mL (ref 0.0–4.0)

## 2015-09-30 LAB — TSH: TSH: 1.49 u[IU]/mL (ref 0.450–4.500)

## 2015-09-30 LAB — HEMOGLOBIN A1C
ESTIMATED AVERAGE GLUCOSE: 151 mg/dL
HEMOGLOBIN A1C: 6.9 % — AB (ref 4.8–5.6)

## 2015-10-01 LAB — MICROALBUMIN / CREATININE URINE RATIO
CREATININE, UR: 102.2 mg/dL
MICROALB/CREAT RATIO: 75.8 mg/g{creat} — AB (ref 0.0–30.0)
MICROALBUM., U, RANDOM: 77.5 ug/mL

## 2015-10-06 ENCOUNTER — Telehealth: Payer: Self-pay

## 2015-10-10 NOTE — Telephone Encounter (Signed)
LMTCB

## 2015-10-13 DIAGNOSIS — M1711 Unilateral primary osteoarthritis, right knee: Secondary | ICD-10-CM | POA: Diagnosis not present

## 2015-11-24 DIAGNOSIS — M1711 Unilateral primary osteoarthritis, right knee: Secondary | ICD-10-CM | POA: Diagnosis not present

## 2016-01-05 DIAGNOSIS — Z23 Encounter for immunization: Secondary | ICD-10-CM | POA: Diagnosis not present

## 2016-01-20 DIAGNOSIS — E1165 Type 2 diabetes mellitus with hyperglycemia: Secondary | ICD-10-CM | POA: Diagnosis not present

## 2016-01-20 DIAGNOSIS — E785 Hyperlipidemia, unspecified: Secondary | ICD-10-CM | POA: Diagnosis not present

## 2016-01-20 LAB — LIPID PANEL
Cholesterol: 147 mg/dL (ref 0–200)
HDL: 44 mg/dL (ref 35–70)
LDL CALC: 90 mg/dL
Triglycerides: 63 mg/dL (ref 40–160)

## 2016-01-20 LAB — MICROALBUMIN, URINE: MICROALB UR: NEGATIVE

## 2016-01-26 DIAGNOSIS — E785 Hyperlipidemia, unspecified: Secondary | ICD-10-CM | POA: Diagnosis not present

## 2016-01-26 DIAGNOSIS — E1165 Type 2 diabetes mellitus with hyperglycemia: Secondary | ICD-10-CM | POA: Diagnosis not present

## 2016-01-27 DIAGNOSIS — E119 Type 2 diabetes mellitus without complications: Secondary | ICD-10-CM | POA: Diagnosis not present

## 2016-01-27 DIAGNOSIS — H2513 Age-related nuclear cataract, bilateral: Secondary | ICD-10-CM | POA: Diagnosis not present

## 2016-01-27 LAB — HM DIABETES EYE EXAM

## 2016-05-10 ENCOUNTER — Ambulatory Visit (INDEPENDENT_AMBULATORY_CARE_PROVIDER_SITE_OTHER): Payer: Medicare Other | Admitting: Internal Medicine

## 2016-05-10 ENCOUNTER — Encounter: Payer: Self-pay | Admitting: Internal Medicine

## 2016-05-10 VITALS — BP 126/98 | HR 85 | Temp 97.5°F | Ht 69.0 in | Wt 198.0 lb

## 2016-05-10 DIAGNOSIS — R0981 Nasal congestion: Secondary | ICD-10-CM

## 2016-05-10 DIAGNOSIS — H1033 Unspecified acute conjunctivitis, bilateral: Secondary | ICD-10-CM

## 2016-05-10 MED ORDER — NEOMYCIN-POLYMYXIN-DEXAMETH 3.5-10000-0.1 OP SUSP: 2.0000 [drp] | Freq: Four times a day (QID) | OPHTHALMIC | 0 refills | Status: DC

## 2016-05-10 NOTE — Patient Instructions (Addendum)
Sudafed 30 mg - take twice a day  Allergic Conjunctivitis, Adult Allergic conjunctivitis is inflammation of the clear membrane that covers the white part of your eye and the inner surface of your eyelid (conjunctiva). The inflammation is caused by allergies. The blood vessels in the conjunctiva become inflamed and this causes the eyes to become red or pink. The eyes often feel itchy. Allergic conjunctivitis cannot be spread from one person to another person (is not contagious). What are the causes? This condition is caused by an allergic reaction. Common causes of an allergic reaction (allergens) include:  Outdoor allergens, such as:  Pollen.  Grass and weeds.  Mold spores.  Indoor allergens, such as:  Dust.  Smoke.  Mold.  Pet dander.  Animal hair. What increases the risk? You may be more likely to develop this condition if you have a family history of allergies, such as:  Allergic rhinitis.  Bronchial asthma.  Atopic dermatitis. What are the signs or symptoms? Symptoms of this condition include eyes that are:  Itchy.  Red.  Watery.  Puffy. Your eyes may also:  Sting or burn.  Have clear drainage coming from them. How is this diagnosed? This condition may be diagnosed by medical history and physical exam. If you have drainage from your eyes, it may be tested to rule out other causes of conjunctivitis. You may also need to see a health care provider who specializes in treating allergies (allergist) or eye conditions (ophthalmologist) for tests to confirm the diagnosis. You may have:  Skin tests to see which allergens are causing your symptoms. These tests involve pricking the skin with a tiny needle and exposing the skin to small amounts of potential allergens to see if your skin reacts.  Blood tests.  Tissue scrapings from your eyelid. These will be examined under a microscope. How is this treated? Treatments for this condition may include:  Cold cloths  (compresses) to soothe itching and swelling.  Washing the face to remove allergens.  Eye drops. These may be prescription or over-the-counter. There are several different types. You may need to try different types to see which one works best for you. Your may need:  Eye drops that block the allergic reaction (antihistamine).  Eye drops that reduce swelling and irritation (anti-inflammatory).  Steroid eye drops to lessen a severe reaction (vernal conjunctivitis).  Oral antihistamine medicines to reduce your allergic reaction. You may need these if eye drops do not help or are difficult to use. Follow these instructions at home:  Avoid known allergens whenever possible.  Take or apply over-the-counter and prescription medicines only as told by your health care provider. These include any eye drops.  Apply a cool, clean washcloth to your eye for 10-20 minutes, 3-4 times a day.  Do not touch or rub your eyes.  Do not wear contact lenses until the inflammation is gone. Wear glasses instead.  Do not wear eye makeup until the inflammation is gone.  Keep all follow-up visits as told by your health care provider. This is important. Contact a health care provider if:  Your symptoms get worse or do not improve with treatment.  You have mild eye pain.  You have sensitivity to light.  You have spots or blisters on your eyes.  You have pus draining from your eye.  You have a fever. Get help right away if:  You have redness, swelling, or other symptoms in only one eye.  Your vision is blurred or you have vision changes.  You have severe eye pain. This information is not intended to replace advice given to you by your health care provider. Make sure you discuss any questions you have with your health care provider. Document Released: 06/23/2002 Document Revised: 11/30/2015 Document Reviewed: 10/14/2015 Elsevier Interactive Patient Education  2017 [employer].

## 2016-05-10 NOTE — Progress Notes (Signed)
Date:  05/10/2016   Name:  Marc Bond   DOB:  Jan 28, 1946   MRN:  [id number]   Chief Complaint: Ear Fullness Ear Fullness   There is pain in both ears. This is a new problem. The current episode started in the past 7 days. The problem occurs constantly. The problem has been unchanged. There has been no fever. Pertinent negatives include no ear discharge. He has tried nothing for the symptoms.  Eye Problem   Both eyes are affected.This is a new problem. The current episode started in the past 7 days. The problem occurs constantly. There was no injury mechanism. Associated symptoms include blurred vision, an eye discharge, eye redness and a foreign body sensation. Pertinent negatives include no fever.      Review of Systems  Constitutional: Negative for chills, fatigue and fever.  HENT: Negative for ear discharge and ear pain.   Eyes: Positive for blurred vision, discharge and redness.  Respiratory: Negative for chest tightness and shortness of breath.   Cardiovascular: Negative for chest pain.    Patient Active Problem List   Diagnosis Date Noted  . Degenerative disc disease, lumbar 09/29/2015  . Primary osteoarthritis of right knee 09/29/2015  . Irritable colon 03/12/2015  . Controlled type 2 diabetes mellitus without complication (HCC) 03/12/2015  . Dyslipidemia 03/12/2015  . Enlarged prostate 03/12/2015  . Essential (primary) hypertension 03/12/2015  . Failure of erection 03/12/2015  . Seborrhea capitis 03/12/2015  . Gastroduodenal ulcer 03/12/2015  . Acquired deformities of toe 03/03/2012  . Bilateral cataracts 12/10/2011    Prior to Admission medications   Medication Sig Start Date End Date Taking? Authorizing Provider  celecoxib (CELEBREX) 200 MG capsule TAKE ONE CAPSULE BY MOUTH EVERY DAY 06/18/15  Yes Reubin MilanLaura H Jadyn Brasher, MD  dicyclomine (BENTYL) 10 MG capsule take 1 capsule by mouth three times a day 06/11/15  Yes Reubin MilanLaura H Dewana Ammirati, MD  glucose blood (ONETOUCH VERIO)  test strip ONETOUCH VERIO (In Vitro Strip) - Historical Medication  1 daily Active   Yes Historical Provider, MD  MULTIPLE VITAMINS PO Take 1 tablet by mouth daily.   Yes Historical Provider, MD  omeprazole (PRILOSEC) 40 MG capsule TAKE ONE CAPSULE BY MOUTH EVERY DAY 06/18/15  Yes Reubin MilanLaura H Jakera Beaupre, MD  ramipril (ALTACE) 2.5 MG capsule Take 1 capsule by mouth daily. 07/15/14  Yes Historical Provider, MD  simvastatin (ZOCOR) 40 MG tablet Take 1 tablet by mouth at bedtime.   Yes Historical Provider, MD  sitaGLIPtin-metformin (JANUMET) 50-1000 MG tablet Take 1 tablet by mouth 2 (two) times daily.   Yes Historical Provider, MD  tadalafil (CIALIS) 10 MG tablet Take by mouth. 05/22/12   Historical Provider, MD    No Known Allergies  Past Surgical History:  Procedure Laterality Date  . COLONOSCOPY  2013    Social History  Substance Use Topics  . Smoking status: Former Games developermoker  . Smokeless tobacco: Former NeurosurgeonUser  . Alcohol use No     Medication list has been reviewed and updated.   Physical Exam  Constitutional: He is oriented to person, place, and time. He appears well-developed. No distress.  HENT:  Head: Normocephalic and atraumatic.  Right Ear: Ear canal normal. Tympanic membrane is retracted. Tympanic membrane is not erythematous.  Left Ear: Ear canal normal. Tympanic membrane is retracted. Tympanic membrane is not erythematous.  Nose: Right sinus exhibits no maxillary sinus tenderness. Left sinus exhibits no maxillary sinus tenderness.  Mouth/Throat: No posterior oropharyngeal edema or posterior oropharyngeal erythema.  Eyes: Right eye exhibits chemosis and discharge. Left eye exhibits chemosis and discharge. Right conjunctiva is not injected. Left conjunctiva is not injected.  Pulmonary/Chest: Effort normal. No respiratory distress.  Musculoskeletal: Normal range of motion.  Lymphadenopathy:    He has no cervical adenopathy.  Neurological: He is alert and oriented to person, place,  and time.  Skin: Skin is warm and dry. No rash noted.  Psychiatric: He has a normal mood and affect. His behavior is normal. Thought content normal.  Nursing note and vitals reviewed.   BP (!) 126/98   Pulse 85   Temp 97.5 F (36.4 C)   Ht 5\' 9"  (1.753 m)   Wt 198 lb (89.8 kg)   SpO2 99%   BMI 29.24 kg/m   Assessment and Plan: 1. Sinus congestion Begin sudafed 30 mg bid for ear sx  2. Acute conjunctivitis of both eyes, unspecified acute conjunctivitis type - neomycin-polymyxin b-dexamethasone (MAXITROL) 3.5-10000-0.1 SUSP; Place 2 drops into both eyes every 6 (six) hours.  Dispense: 5 mL; Refill: 0   Marc Edward, MD [facility]  05/10/2016

## 2016-05-18 DIAGNOSIS — E785 Hyperlipidemia, unspecified: Secondary | ICD-10-CM | POA: Diagnosis not present

## 2016-05-18 DIAGNOSIS — E1165 Type 2 diabetes mellitus with hyperglycemia: Secondary | ICD-10-CM | POA: Diagnosis not present

## 2016-05-18 LAB — HEMOGLOBIN A1C: Hemoglobin A1C: 7.7

## 2016-05-27 ENCOUNTER — Other Ambulatory Visit: Payer: Self-pay | Admitting: Internal Medicine

## 2016-06-01 ENCOUNTER — Other Ambulatory Visit: Payer: Self-pay | Admitting: Internal Medicine

## 2016-06-13 ENCOUNTER — Other Ambulatory Visit: Payer: Self-pay | Admitting: Internal Medicine

## 2016-06-26 DIAGNOSIS — N400 Enlarged prostate without lower urinary tract symptoms: Secondary | ICD-10-CM | POA: Diagnosis not present

## 2016-06-26 DIAGNOSIS — N529 Male erectile dysfunction, unspecified: Secondary | ICD-10-CM | POA: Diagnosis not present

## 2016-06-26 DIAGNOSIS — R351 Nocturia: Secondary | ICD-10-CM | POA: Diagnosis not present

## 2016-07-09 ENCOUNTER — Encounter: Payer: Self-pay | Admitting: Internal Medicine

## 2016-07-09 ENCOUNTER — Other Ambulatory Visit: Payer: Self-pay | Admitting: Internal Medicine

## 2016-07-09 ENCOUNTER — Ambulatory Visit (INDEPENDENT_AMBULATORY_CARE_PROVIDER_SITE_OTHER): Payer: Medicare Other | Admitting: Internal Medicine

## 2016-07-09 VITALS — BP 94/62 | HR 88 | Ht 70.0 in | Wt 199.0 lb

## 2016-07-09 DIAGNOSIS — F419 Anxiety disorder, unspecified: Secondary | ICD-10-CM

## 2016-07-09 MED ORDER — SERTRALINE HCL 50 MG PO TABS: 50.0000 mg | ORAL_TABLET | Freq: Every day | ORAL | 1 refills | Status: DC

## 2016-07-09 NOTE — Progress Notes (Signed)
Date:  07/09/2016   Name:  Marc Bond   DOB:  20-Mar-1946   MRN:  [id number]   Chief Complaint: Anxiety (Pt wants to discuss nerves. -Ex: " gets frightened when on the highway if someone blows the horn at me." ) and Groin Pain (Rt groin pain started yesterday- Uncomfortable pain. Using the restroom okay.) Anxiety  Presents for initial visit. Onset was 6 to 12 months ago. The problem has been gradually worsening. Symptoms include irritability and nervous/anxious behavior. Patient reports no chest pain, decreased concentration, depressed mood, insomnia, palpitations, shortness of breath or suicidal ideas. Symptoms occur most days. The symptoms are aggravated by social activities and family issues.   Past treatments include nothing.      Review of Systems  Constitutional: Positive for irritability. Negative for chills, fatigue and fever.  Respiratory: Negative for chest tightness, shortness of breath and wheezing.   Cardiovascular: Negative for chest pain and palpitations.  Psychiatric/Behavioral: Positive for sleep disturbance. Negative for decreased concentration, dysphoric mood and suicidal ideas. The patient is nervous/anxious. The patient does not have insomnia.     Patient Active Problem List   Diagnosis Date Noted  . Degenerative disc disease, lumbar 09/29/2015  . Primary osteoarthritis of right knee 09/29/2015  . Irritable colon 03/12/2015  . Controlled type 2 diabetes mellitus without complication (HCC) 03/12/2015  . Dyslipidemia 03/12/2015  . Enlarged prostate 03/12/2015  . Essential (primary) hypertension 03/12/2015  . Failure of erection 03/12/2015  . Seborrhea capitis 03/12/2015  . Gastroduodenal ulcer 03/12/2015  . Acquired deformities of toe 03/03/2012  . Bilateral cataracts 12/10/2011    Prior to Admission medications   Medication Sig Start Date End Date Taking? Authorizing Provider  alfuzosin (UROXATRAL) 10 MG 24 hr tablet Take 10 mg by mouth daily. 06/26/16   Yes Historical Provider, MD  celecoxib (CELEBREX) 200 MG capsule TAKE ONE CAPSULE BY MOUTH EVERY DAY 06/13/16  Yes Reubin Milan, MD  CIALIS 20 MG tablet take 1 tablet by mouth once daily if needed 06/26/16  Yes Historical Provider, MD  dicyclomine (BENTYL) 10 MG capsule take 1 capsule by mouth three times a day 05/28/16  Yes Reubin Milan, MD  glucose blood (ONETOUCH VERIO) test strip ONETOUCH VERIO (In Vitro Strip) - Historical Medication  1 daily Active   Yes Historical Provider, MD  MULTIPLE VITAMINS PO Take 1 tablet by mouth daily.   Yes Historical Provider, MD  neomycin-polymyxin b-dexamethasone (MAXITROL) 3.5-10000-0.1 SUSP Place 2 drops into both eyes every 6 (six) hours. 05/10/16  Yes Reubin Milan, MD  omeprazole (PRILOSEC) 40 MG capsule TAKE ONE CAPSULE BY MOUTH EVERY DAY 06/01/16  Yes Reubin Milan, MD  ramipril (ALTACE) 2.5 MG capsule Take 1 capsule by mouth daily. 07/15/14  Yes Historical Provider, MD  simvastatin (ZOCOR) 40 MG tablet Take 1 tablet by mouth at bedtime.   Yes Historical Provider, MD  sitaGLIPtin-metformin (JANUMET) 50-1000 MG tablet Take 1 tablet by mouth 2 (two) times daily.   Yes Historical Provider, MD  tadalafil (CIALIS) 10 MG tablet Take by mouth. 05/22/12  Yes Historical Provider, MD    No Known Allergies  Past Surgical History:  Procedure Laterality Date  . COLONOSCOPY  2013    Social History  Substance Use Topics  . Smoking status: Former Games developer  . Smokeless tobacco: Former Neurosurgeon  . Alcohol use No     Medication list has been reviewed and updated.   Physical Exam  Constitutional: He is oriented to person, place,  and time. He appears well-developed. No distress.  HENT:  Head: Normocephalic and atraumatic.  Neck: Normal range of motion. Neck supple.  Cardiovascular: Normal rate, regular rhythm and normal heart sounds.   Pulmonary/Chest: Effort normal and breath sounds normal. No respiratory distress.  Musculoskeletal: He exhibits no  edema.  Neurological: He is alert and oriented to person, place, and time.  Skin: Skin is warm and dry. No rash noted.  Psychiatric: He has a normal mood and affect. His behavior is normal. Thought content normal.  Nursing note and vitals reviewed.   BP 94/62 (BP Location: Right Arm, Patient Position: Sitting, Cuff Size: Normal)   Pulse 88   Ht 5\' 10"  (1.778 m)   Wt 199 lb (90.3 kg)   SpO2 98%   BMI 28.55 kg/m   Assessment and Plan: 1. Anxiety Begin Zoloft   Meds ordered this encounter  Medications  . sertraline (ZOLOFT) 50 MG tablet    Sig: Take 1 tablet (50 mg total) by mouth daily.    Dispense:  30 tablet    Refill:  1    Bari EdwardLaura Guliana Weyandt, MD [facility]  07/09/2016

## 2016-07-31 ENCOUNTER — Ambulatory Visit (INDEPENDENT_AMBULATORY_CARE_PROVIDER_SITE_OTHER): Payer: Medicare Other | Admitting: Internal Medicine

## 2016-07-31 ENCOUNTER — Encounter: Payer: Self-pay | Admitting: Internal Medicine

## 2016-07-31 VITALS — BP 98/62 | HR 86 | Temp 98.0°F | Ht 70.0 in | Wt 196.8 lb

## 2016-07-31 DIAGNOSIS — J40 Bronchitis, not specified as acute or chronic: Secondary | ICD-10-CM | POA: Diagnosis not present

## 2016-07-31 MED ORDER — AMOXICILLIN-POT CLAVULANATE 875-125 MG PO TABS: 1.0000 | ORAL_TABLET | Freq: Two times a day (BID) | ORAL | 0 refills | Status: DC

## 2016-07-31 NOTE — Patient Instructions (Signed)
Robitussin DM or Delsym for cough

## 2016-07-31 NOTE — Progress Notes (Signed)
Date:  07/31/2016   Name:  Marc Bond   DOB:  [birth year]   MRN:  [id number]   Chief Complaint: Cough (dark green/yellow production. X 1 week. No fever noticed. ) Cough  The problem occurs every few minutes. The cough is productive of sputum. Associated symptoms include postnasal drip. Pertinent negatives include no chest pain, chills, fever, headaches, shortness of breath or wheezing. He has tried nothing for the symptoms. There is no history of asthma or COPD.    Review of Systems  Constitutional: Positive for fatigue. Negative for chills and fever.  HENT: Positive for congestion, postnasal drip and sinus pressure.   Eyes: Negative for visual disturbance.  Respiratory: Positive for cough. Negative for shortness of breath and wheezing.   Cardiovascular: Negative for chest pain and palpitations.  Neurological: Negative for dizziness, syncope and headaches.    Patient Active Problem List   Diagnosis Date Noted  . Degenerative disc disease, lumbar 09/29/2015  . Primary osteoarthritis of right knee 09/29/2015  . Irritable colon 03/12/2015  . Controlled type 2 diabetes mellitus without complication (HCC) 03/12/2015  . Dyslipidemia 03/12/2015  . Enlarged prostate 03/12/2015  . Essential (primary) hypertension 03/12/2015  . Failure of erection 03/12/2015  . Seborrhea capitis 03/12/2015  . Gastroduodenal ulcer 03/12/2015  . Acquired deformities of toe 03/03/2012  . Bilateral cataracts 12/10/2011    Prior to Admission medications   Medication Sig Start Date End Date Taking? Authorizing Provider  alfuzosin (UROXATRAL) 10 MG 24 hr tablet Take 10 mg by mouth daily. 06/26/16  Yes Historical Provider, MD  celecoxib (CELEBREX) 200 MG capsule TAKE ONE CAPSULE BY MOUTH EVERY DAY 06/13/16  Yes Reubin Milan, MD  CIALIS 20 MG tablet take 1 tablet by mouth once daily if needed 06/26/16  Yes Historical Provider, MD  dicyclomine (BENTYL) 10 MG capsule take 1 capsule by mouth three times a day  05/28/16  Yes Reubin Milan, MD  glucose blood (ONETOUCH VERIO) test strip ONETOUCH VERIO (In Vitro Strip) - Historical Medication  1 daily Active   Yes Historical Provider, MD  MULTIPLE VITAMINS PO Take 1 tablet by mouth daily.   Yes Historical Provider, MD  neomycin-polymyxin b-dexamethasone (MAXITROL) 3.5-10000-0.1 SUSP Place 2 drops into both eyes every 6 (six) hours. 05/10/16  Yes Reubin Milan, MD  omeprazole (PRILOSEC) 40 MG capsule TAKE ONE CAPSULE BY MOUTH EVERY DAY 06/01/16  Yes Reubin Milan, MD  ramipril (ALTACE) 2.5 MG capsule Take 1 capsule by mouth daily. 07/15/14  Yes Historical Provider, MD  sertraline (ZOLOFT) 50 MG tablet Take 1 tablet (50 mg total) by mouth daily. 07/09/16  Yes Reubin Milan, MD  simvastatin (ZOCOR) 40 MG tablet Take 1 tablet by mouth at bedtime.   Yes Historical Provider, MD  sitaGLIPtin-metformin (JANUMET) 50-1000 MG tablet Take 1 tablet by mouth 2 (two) times daily.   Yes Historical Provider, MD    No Known Allergies  Past Surgical History:  Procedure Laterality Date  . COLONOSCOPY  2013    Social History  Substance Use Topics  . Smoking status: Former Games developer  . Smokeless tobacco: Former Neurosurgeon  . Alcohol use No     Medication list has been reviewed and updated.   Physical Exam  Constitutional: He is oriented to person, place, and time. He appears well-developed. No distress.  HENT:  Head: Normocephalic and atraumatic.  Neck: Normal range of motion. Neck supple.  Cardiovascular: Normal rate, regular rhythm and normal heart sounds.   Pulmonary/Chest:  Effort normal. No respiratory distress. He has decreased breath sounds. He has no wheezes. He has no rhonchi.  Abdominal: Normal appearance.  Musculoskeletal: Normal range of motion.  Neurological: He is alert and oriented to person, place, and time.  Skin: Skin is warm and dry. No rash noted.  Psychiatric: He has a normal mood and affect. His speech is normal and behavior is normal.  Thought content normal.  Nursing note and vitals reviewed.   BP 98/62 (BP Location: Right Arm, Patient Position: Sitting, Cuff Size: Large)   Pulse 86   Temp 98 F (36.7 C) (Oral)   Ht  (1.778 m)   Wt 196 lb 12.8 oz (89.3 kg)   SpO2 99%   BMI 28.24 kg/m   Assessment and Plan: 1. Bronchitis Increase fluids Robitussin or Delsym as needed   Meds ordered this encounter  Medications  . amoxicillin-clavulanate (AUGMENTIN) 875-125 MG tablet    Sig: Take 1 tablet by mouth 2 (two) times daily.    Dispense:  20 tablet    Refill:  0    Bari Edward, MD [facility]  07/31/2016

## 2016-08-19 ENCOUNTER — Encounter: Payer: Self-pay | Admitting: Internal Medicine

## 2016-08-20 ENCOUNTER — Encounter: Payer: Self-pay | Admitting: Internal Medicine

## 2016-08-20 ENCOUNTER — Ambulatory Visit (INDEPENDENT_AMBULATORY_CARE_PROVIDER_SITE_OTHER): Payer: Medicare Other | Admitting: Internal Medicine

## 2016-08-20 VITALS — BP 110/72 | HR 88 | Ht 70.0 in | Wt 197.8 lb

## 2016-08-20 DIAGNOSIS — I10 Essential (primary) hypertension: Secondary | ICD-10-CM | POA: Diagnosis not present

## 2016-08-20 DIAGNOSIS — F39 Unspecified mood [affective] disorder: Secondary | ICD-10-CM

## 2016-08-20 HISTORY — DX: Unspecified mood (affective) disorder: F39

## 2016-08-20 MED ORDER — SERTRALINE HCL 50 MG PO TABS: 50.0000 mg | ORAL_TABLET | Freq: Every day | ORAL | 5 refills | Status: DC

## 2016-08-20 NOTE — Progress Notes (Signed)
Date:  08/20/2016   Name:  Marc MerlesHarry Azad   DOB:  Apr 28, 1945   MRN:  [id number]   Chief Complaint: Anxiety (New medication is helping. Feeling much better. ) Anxiety  Presents for follow-up visit. Symptoms include irritability. Patient reports no chest pain, dizziness, feeling of choking, insomnia, nausea, nervous/anxious behavior, palpitations, shortness of breath or suicidal ideas. Symptoms occur occasionally. The quality of sleep is good.       Review of Systems  Constitutional: Positive for irritability. Negative for chills, fatigue, fever and unexpected weight change.  Respiratory: Negative for chest tightness and shortness of breath.   Cardiovascular: Negative for chest pain and palpitations.  Gastrointestinal: Negative for abdominal pain and nausea.  Neurological: Negative for dizziness and headaches.  Psychiatric/Behavioral: Negative for dysphoric mood and suicidal ideas. The patient is not nervous/anxious and does not have insomnia.     Patient Active Problem List   Diagnosis Date Noted  . Degenerative disc disease, lumbar 09/29/2015  . Primary osteoarthritis of right knee 09/29/2015  . Irritable colon 03/12/2015  . Controlled type 2 diabetes mellitus without complication (HCC) 03/12/2015  . Dyslipidemia 03/12/2015  . Enlarged prostate 03/12/2015  . Essential (primary) hypertension 03/12/2015  . Failure of erection 03/12/2015  . Seborrhea capitis 03/12/2015  . Gastroduodenal ulcer 03/12/2015  . Acquired deformities of toe 03/03/2012  . Bilateral cataracts 12/10/2011    Prior to Admission medications   Medication Sig Start Date End Date Taking? Authorizing Provider  alfuzosin (UROXATRAL) 10 MG 24 hr tablet Take 10 mg by mouth daily. 06/26/16  Yes [provider]  celecoxib (CELEBREX) 200 MG capsule TAKE ONE CAPSULE BY MOUTH EVERY DAY 06/13/16  Yes Reubin MilanBerglund, Jasmond River H, MD  CIALIS 20 MG tablet take 1 tablet by mouth once daily if needed 06/26/16  Yes [provider]  dicyclomine (BENTYL) 10 MG capsule take 1 capsule by mouth three times a day 05/28/16  Yes Reubin MilanBerglund, Samaya Boardley H, MD  glucose blood (ONETOUCH VERIO) test strip ONETOUCH VERIO (In Vitro Strip) - Historical Medication  1 daily Active   Yes [provider]  MULTIPLE VITAMINS PO Take 1 tablet by mouth daily.   Yes [provider]  neomycin-polymyxin b-dexamethasone (MAXITROL) 3.5-10000-0.1 SUSP Place 2 drops into both eyes every 6 (six) hours. 05/10/16  Yes Reubin MilanBerglund, Dezra Mandella H, MD  omeprazole (PRILOSEC) 40 MG capsule TAKE ONE CAPSULE BY MOUTH EVERY DAY 06/01/16  Yes Reubin MilanBerglund, Marylynn Rigdon H, MD  ramipril (ALTACE) 2.5 MG capsule Take 1 capsule by mouth daily. 07/15/14  Yes [provider]  sertraline (ZOLOFT) 50 MG tablet Take 1 tablet (50 mg total) by mouth daily. 07/09/16  Yes Reubin MilanBerglund, Delmy Holdren H, MD  simvastatin (ZOCOR) 40 MG tablet Take 1 tablet by mouth at bedtime.   Yes [provider]  sitaGLIPtin-metformin (JANUMET) 50-1000 MG tablet Take 1 tablet by mouth 2 (two) times daily.   Yes [provider]    No Known Allergies  Past Surgical History:  Procedure Laterality Date  . COLONOSCOPY  2013    Social History  Substance Use Topics  . Smoking status: Former Games developermoker  . Smokeless tobacco: Former NeurosurgeonUser  . Alcohol use No     Medication list has been reviewed and updated.   Physical Exam  Constitutional: He is oriented to person, place, and time. He appears well-developed. No distress.  HENT:  Head: Normocephalic and atraumatic.  Neck: Normal range of motion. Neck supple. No thyromegaly present.  Cardiovascular: Normal rate, regular rhythm and  normal heart sounds.   Pulmonary/Chest: Effort normal and breath sounds normal. No respiratory distress. He has no wheezes.  Neurological: He is alert and oriented to person, place, and time.  Skin: Skin is warm and dry. No rash noted.  Psychiatric: He has a normal mood and affect. His behavior is  normal. Thought content normal.  Nursing note and vitals reviewed.   BP 110/72 (BP Location: Right Arm, Patient Position: Sitting, Cuff Size: Normal)   Pulse 88   Ht 5\' 10"  (1.778 m)   Wt 197 lb 12.8 oz (89.7 kg)   SpO2 99%   BMI 28.38 kg/m   Assessment and Plan: 1. Mood disorder (HCC) Doing well - continue Zoloft  2. Essential (primary) hypertension controlled   Meds ordered this encounter  Medications  . sertraline (ZOLOFT) 50 MG tablet    Sig: Take 1 tablet (50 mg total) by mouth daily.    Dispense:  30 tablet    Refill:  5    Bari Edward, MD [facility]  08/20/2016

## 2016-09-06 ENCOUNTER — Telehealth: Payer: Self-pay

## 2016-09-06 NOTE — Telephone Encounter (Signed)
Pt GF called and left VM stating pt has chest congestion with a lot of thick mucous and cough. Wants RX sent in for relief. I called pt directly and informed him that Dr. Judithann GravesBerglund is out of town, but PA is filling in for her. Would he like to see her? She will not be back until Tuesday. He stated he wants to try OTC meds first. I explained if not feeling any better to call us to make an appt to be seen.

## 2016-09-17 ENCOUNTER — Ambulatory Visit: Payer: Self-pay | Admitting: Internal Medicine

## 2016-09-18 ENCOUNTER — Encounter: Payer: Self-pay | Admitting: Internal Medicine

## 2016-09-18 ENCOUNTER — Ambulatory Visit (INDEPENDENT_AMBULATORY_CARE_PROVIDER_SITE_OTHER): Payer: Medicare Other | Admitting: Internal Medicine

## 2016-09-18 VITALS — BP 128/84 | HR 88 | Temp 98.1°F | Ht 70.0 in | Wt 192.0 lb

## 2016-09-18 DIAGNOSIS — F39 Unspecified mood [affective] disorder: Secondary | ICD-10-CM | POA: Diagnosis not present

## 2016-09-18 DIAGNOSIS — J40 Bronchitis, not specified as acute or chronic: Secondary | ICD-10-CM

## 2016-09-18 MED ORDER — AZITHROMYCIN 250 MG PO TABS: ORAL_TABLET | ORAL | 0 refills | Status: DC

## 2016-09-18 NOTE — Progress Notes (Signed)
Date:  09/18/2016   Name:  Marc Bond   DOB:  [birth year]   MRN:  [id number]   Chief Complaint: Cough (still coughing up yellow mucous- chest is sore from coughing. No fever noticed. Still has chest and throat congestion. No facial pressure. Breaking out in sweats at night. ) Cough  This is a new problem. The current episode started 1 to 4 weeks ago. The problem has been gradually improving. The problem occurs every few hours. The cough is productive of sputum. Pertinent negatives include no chest pain, chills, ear congestion, fever, sore throat, sweats or wheezing. He has tried OTC cough suppressant for the symptoms. The treatment provided mild relief.  Anxiety  Presents for follow-up visit. Patient reports no chest pain, nervous/anxious behavior or palpitations. Symptoms occur occasionally. The severity of symptoms is mild (much improved with Zoloft).      Review of Systems  Constitutional: Negative for chills, fatigue and fever.  HENT: Negative for congestion, sinus pressure and sore throat.   Eyes: Negative for visual disturbance.  Respiratory: Positive for cough. Negative for chest tightness and wheezing.   Cardiovascular: Negative for chest pain and palpitations.  Gastrointestinal: Negative for abdominal pain.  Endocrine: Negative for polydipsia and polyuria.  Genitourinary: Negative for dysuria.  Psychiatric/Behavioral: Negative for dysphoric mood and sleep disturbance. The patient is not nervous/anxious.     Patient Active Problem List   Diagnosis Date Noted  . Mood disorder (HCC) 08/20/2016  . Degenerative disc disease, lumbar 09/29/2015  . Primary osteoarthritis of right knee 09/29/2015  . Irritable colon 03/12/2015  . Controlled type 2 diabetes mellitus without complication (HCC) 03/12/2015  . Dyslipidemia 03/12/2015  . Enlarged prostate 03/12/2015  . Essential (primary) hypertension 03/12/2015  . Failure of erection 03/12/2015  . Seborrhea capitis 03/12/2015  .  Gastroduodenal ulcer 03/12/2015  . Acquired deformities of toe 03/03/2012  . Bilateral cataracts 12/10/2011    Prior to Admission medications   Medication Sig Start Date End Date Taking? Authorizing Provider  alfuzosin (UROXATRAL) 10 MG 24 hr tablet Take 10 mg by mouth daily. 06/26/16  Yes [provider]  celecoxib (CELEBREX) 200 MG capsule TAKE ONE CAPSULE BY MOUTH EVERY DAY 06/13/16  Yes Reubin MilanBerglund, Charmagne Buhl H, MD  CIALIS 20 MG tablet take 1 tablet by mouth once daily if needed 06/26/16  Yes [provider]  dicyclomine (BENTYL) 10 MG capsule take 1 capsule by mouth three times a day 05/28/16  Yes Reubin MilanBerglund, Karlee Staff H, MD  glucose blood (ONETOUCH VERIO) test strip ONETOUCH VERIO (In Vitro Strip) - Historical Medication  1 daily Active   Yes [provider]  MULTIPLE VITAMINS PO Take 1 tablet by mouth daily.   Yes [provider]  neomycin-polymyxin b-dexamethasone (MAXITROL) 3.5-10000-0.1 SUSP Place 2 drops into both eyes every 6 (six) hours. 05/10/16  Yes Reubin MilanBerglund, Copper Basnett H, MD  omeprazole (PRILOSEC) 40 MG capsule TAKE ONE CAPSULE BY MOUTH EVERY DAY 06/01/16  Yes Reubin MilanBerglund, Dariush Mcnellis H, MD  ramipril (ALTACE) 2.5 MG capsule Take 1 capsule by mouth daily. 07/15/14  Yes [provider]  sertraline (ZOLOFT) 50 MG tablet Take 1 tablet (50 mg total) by mouth daily. 08/20/16  Yes Reubin MilanBerglund, Farrell Broerman H, MD  simvastatin (ZOCOR) 40 MG tablet Take 1 tablet by mouth at bedtime.   Yes [provider]  sitaGLIPtin-metformin (JANUMET) 50-1000 MG tablet Take 1 tablet by mouth 2 (two) times daily.   Yes [provider]    No Known Allergies  Past Surgical History:  Procedure Laterality Date  . COLONOSCOPY  2013    Social History  Substance Use Topics  . Smoking status: Former Games developer  . Smokeless tobacco: Former Neurosurgeon  . Alcohol use No     Medication list has been reviewed and updated.   Physical Exam  Constitutional: He is oriented to person, place, and  time. He appears well-developed. No distress.  HENT:  Head: Normocephalic and atraumatic.  Nose: Right sinus exhibits no maxillary sinus tenderness and no frontal sinus tenderness. Left sinus exhibits no maxillary sinus tenderness and no frontal sinus tenderness.  Neck: Normal range of motion. Neck supple. No thyromegaly present.  Cardiovascular: Normal rate, regular rhythm and normal heart sounds.   Pulmonary/Chest: Effort normal and breath sounds normal. No respiratory distress. He has no wheezes. He has no rales.  Musculoskeletal: Normal range of motion.  Neurological: He is alert and oriented to person, place, and time.  Skin: Skin is warm and dry. No rash noted.  Psychiatric: He has a normal mood and affect. His speech is normal and behavior is normal. Thought content normal.  Nursing note and vitals reviewed.   BP 128/84   Pulse 88   Temp 98.1 F (36.7 C)   Ht 5\' 10"  (1.778 m)   Wt 192 lb (87.1 kg)   SpO2 98%   BMI 27.55 kg/m   Assessment and Plan: 1. Bronchitis Continue Robitussin - azithromycin (ZITHROMAX Z-PAK) 250 MG tablet; UAD  Dispense: 6 each; Refill: 0  2. Mood disorder (HCC) Improved on Zoloft   Meds ordered this encounter  Medications  . azithromycin (ZITHROMAX Z-PAK) 250 MG tablet    Sig: UAD    Dispense:  6 each    Refill:  0    Bari Edward, MD [facility]  09/18/2016

## 2016-09-18 NOTE — Progress Notes (Signed)
ERROR

## 2016-10-02 DIAGNOSIS — N400 Enlarged prostate without lower urinary tract symptoms: Secondary | ICD-10-CM | POA: Diagnosis not present

## 2016-10-15 ENCOUNTER — Ambulatory Visit
Admission: RE | Admit: 2016-10-15 | Discharge: 2016-10-15 | Disposition: A | Discharge status: Home / Self Care | Payer: Medicare Other | Source: Ambulatory Visit | Attending: Internal Medicine | Admitting: Internal Medicine

## 2016-10-15 ENCOUNTER — Ambulatory Visit (INDEPENDENT_AMBULATORY_CARE_PROVIDER_SITE_OTHER): Payer: Medicare Other | Admitting: Internal Medicine

## 2016-10-15 ENCOUNTER — Encounter: Payer: Self-pay | Admitting: Internal Medicine

## 2016-10-15 VITALS — BP 118/68 | HR 88 | Temp 97.8°F | Ht 70.0 in | Wt 194.0 lb

## 2016-10-15 DIAGNOSIS — R059 Cough, unspecified: Secondary | ICD-10-CM

## 2016-10-15 DIAGNOSIS — R918 Other nonspecific abnormal finding of lung field: Secondary | ICD-10-CM | POA: Diagnosis not present

## 2016-10-15 DIAGNOSIS — K279 Peptic ulcer, site unspecified, unspecified as acute or chronic, without hemorrhage or perforation: Secondary | ICD-10-CM | POA: Diagnosis not present

## 2016-10-15 DIAGNOSIS — R05 Cough: Secondary | ICD-10-CM

## 2016-10-15 NOTE — Patient Instructions (Signed)
Hold Omeprazole  Start Dexilant 60 mg twice a day for 2 weeks

## 2016-10-15 NOTE — Progress Notes (Signed)
Date:  10/15/2016   Name:  Marc MerlesHarry Sparkman   DOB:  08/10/1945   MRN:  [id number]   Chief Complaint: Cough (Still not getting better. Feels like gets better during the day but at night gets bad. Coughing up thick green mucous in the morning. Sweating a lot during sleep. Started back up about a month ago.  ) Cough  This is a chronic problem. The current episode started more than 1 month ago. The problem has been waxing and waning. The cough is productive of sputum (first thing in the AM - has a spell of coughing and brings up thick green mucus.  He may cough some during the day but it is nonproduction.). Associated symptoms include heartburn and sweats. Pertinent negatives include no chest pain, chills, fever, nasal congestion, shortness of breath, weight loss or wheezing. Risk factors: hx of +PPD [place] yrs ago in [place]. The treatment provided no relief.      Review of Systems  Constitutional: Negative for chills, fever and weight loss.  Respiratory: Positive for cough. Negative for chest tightness, shortness of breath and wheezing.   Cardiovascular: Negative for chest pain and palpitations.  Gastrointestinal: Positive for heartburn. Negative for blood in stool, diarrhea and vomiting.  Neurological: Negative for tremors and weakness.    Patient Active Problem List   Diagnosis Date Noted  . Mood disorder (HCC) 08/20/2016  . Degenerative disc disease, lumbar 09/29/2015  . Primary osteoarthritis of right knee 09/29/2015  . Irritable colon 03/12/2015  . Controlled type 2 diabetes mellitus without complication (HCC) 03/12/2015  . Dyslipidemia 03/12/2015  . Enlarged prostate 03/12/2015  . Essential (primary) hypertension 03/12/2015  . Failure of erection 03/12/2015  . Seborrhea capitis 03/12/2015  . Gastroduodenal ulcer 03/12/2015  . Acquired deformities of toe 03/03/2012  . Bilateral cataracts 12/10/2011    Prior to Admission medications   Medication Sig Start Date End Date Taking?  Authorizing Provider  alfuzosin (UROXATRAL) 10 MG 24 hr tablet Take 10 mg by mouth daily. 06/26/16  Yes [provider]  celecoxib (CELEBREX) 200 MG capsule TAKE ONE CAPSULE BY MOUTH EVERY DAY 06/13/16  Yes Reubin MilanBerglund, Regan Llorente H, MD  CIALIS 20 MG tablet take 1 tablet by mouth once daily if needed 06/26/16  Yes [provider]  dicyclomine (BENTYL) 10 MG capsule take 1 capsule by mouth three times a day 05/28/16  Yes Reubin MilanBerglund, Leitha Hyppolite H, MD  glucose blood (ONETOUCH VERIO) test strip ONETOUCH VERIO (In Vitro Strip) - Historical Medication  1 daily Active   Yes [provider]  MULTIPLE VITAMINS PO Take 1 tablet by mouth daily.   Yes [provider]  neomycin-polymyxin b-dexamethasone (MAXITROL) 3.5-10000-0.1 SUSP Place 2 drops into both eyes every 6 (six) hours. 05/10/16  Yes Reubin MilanBerglund, Jaiyana Canale H, MD  omeprazole (PRILOSEC) 40 MG capsule TAKE ONE CAPSULE BY MOUTH EVERY DAY 06/01/16  Yes Reubin MilanBerglund, Danyelle Brookover H, MD  ramipril (ALTACE) 2.5 MG capsule Take 1 capsule by mouth daily. 07/15/14  Yes [provider]  sertraline (ZOLOFT) 50 MG tablet Take 1 tablet (50 mg total) by mouth daily. 08/20/16  Yes Reubin MilanBerglund, Annistyn Depass H, MD  simvastatin (ZOCOR) 40 MG tablet Take 1 tablet by mouth at bedtime.   Yes [provider]  sitaGLIPtin-metformin (JANUMET) 50-1000 MG tablet Take 1 tablet by mouth 2 (two) times daily.   Yes [provider]    No Known Allergies  Past Surgical History:  Procedure Laterality Date  . COLONOSCOPY  2013  Social History  Substance Use Topics  . Smoking status: Former Games developer  . Smokeless tobacco: Former Neurosurgeon  . Alcohol use No     Medication list has been reviewed and updated.   Physical Exam  Constitutional: He is oriented to person, place, and time. He appears well-developed. No distress.  HENT:  Head: Normocephalic and atraumatic.  Right Ear: Tympanic membrane and ear canal normal.  Left Ear: Tympanic membrane and ear canal  normal.  Nose: Right sinus exhibits no maxillary sinus tenderness and no frontal sinus tenderness. Left sinus exhibits no maxillary sinus tenderness and no frontal sinus tenderness.  Mouth/Throat: No posterior oropharyngeal edema or posterior oropharyngeal erythema.  Neck: Normal range of motion.  Cardiovascular: Normal rate, regular rhythm and normal heart sounds.   Pulmonary/Chest: Effort normal and breath sounds normal. No respiratory distress. He has no wheezes. He has no rales.  Musculoskeletal: Normal range of motion.  Neurological: He is alert and oriented to person, place, and time.  Skin: Skin is warm and dry. No rash noted.  Psychiatric: He has a normal mood and affect. His behavior is normal. Thought content normal.  Nursing note and vitals reviewed.   BP 118/68   Pulse 88   Temp 97.8 F (36.6 C)   Ht 5\' 10"  (1.778 m)   Wt 194 lb (88 kg)   SpO2 98%   BMI 27.84 kg/m   Assessment and Plan: 1. Cough Rule out pulmonary process - DG Chest 2 View; Future  2. Gastroduodenal ulcer Possible silent reflux - trial of Dexilant bid for 2 weeks   No orders of the defined types were placed in this encounter.   Bari Edward, MD [facility]  10/15/2016

## 2016-10-16 ENCOUNTER — Other Ambulatory Visit: Payer: Self-pay | Admitting: Internal Medicine

## 2016-10-16 DIAGNOSIS — R7611 Nonspecific reaction to tuberculin skin test without active tuberculosis: Secondary | ICD-10-CM | POA: Insufficient documentation

## 2016-10-16 DIAGNOSIS — J984 Other disorders of lung: Secondary | ICD-10-CM | POA: Insufficient documentation

## 2016-10-18 ENCOUNTER — Other Ambulatory Visit: Payer: Self-pay

## 2016-10-18 ENCOUNTER — Other Ambulatory Visit: Payer: Self-pay | Admitting: Internal Medicine

## 2016-10-18 DIAGNOSIS — E119 Type 2 diabetes mellitus without complications: Secondary | ICD-10-CM

## 2016-10-18 DIAGNOSIS — I10 Essential (primary) hypertension: Secondary | ICD-10-CM

## 2016-10-22 DIAGNOSIS — I10 Essential (primary) hypertension: Secondary | ICD-10-CM | POA: Diagnosis not present

## 2016-10-23 LAB — CREATININE, SERUM
CREATININE: 1.15 mg/dL (ref 0.76–1.27)
GFR, EST AFRICAN AMERICAN: 73 mL/min/{1.73_m2} (ref >59)
GFR, EST NON AFRICAN AMERICAN: 63 mL/min/{1.73_m2} (ref >59)

## 2016-10-26 ENCOUNTER — Ambulatory Visit: Admission: RE | Admit: 2016-10-26 | Payer: Medicare Other | Source: Ambulatory Visit

## 2016-10-26 DIAGNOSIS — J439 Emphysema, unspecified: Secondary | ICD-10-CM | POA: Diagnosis not present

## 2016-10-29 ENCOUNTER — Telehealth: Payer: Self-pay | Admitting: Internal Medicine

## 2016-10-29 ENCOUNTER — Encounter: Payer: Self-pay | Admitting: Internal Medicine

## 2016-10-29 DIAGNOSIS — N281 Cyst of kidney, acquired: Secondary | ICD-10-CM | POA: Insufficient documentation

## 2016-10-29 NOTE — Telephone Encounter (Signed)
Please inform patient that the CT shows a large open space in the right upper lung.  There is no evidence of any cancer but I want to refer him to a Pulmonologist to further evaluate and determine treatment if needed.

## 2016-10-29 NOTE — Telephone Encounter (Signed)
Pt informed of CT- agrees to pulmonologist eval and wants referral sent.

## 2016-10-30 ENCOUNTER — Other Ambulatory Visit: Payer: Self-pay | Admitting: Internal Medicine

## 2016-10-30 DIAGNOSIS — J984 Other disorders of lung: Secondary | ICD-10-CM

## 2016-11-02 DIAGNOSIS — R918 Other nonspecific abnormal finding of lung field: Secondary | ICD-10-CM | POA: Diagnosis not present

## 2016-11-02 DIAGNOSIS — J984 Other disorders of lung: Secondary | ICD-10-CM | POA: Diagnosis not present

## 2016-11-05 DIAGNOSIS — J984 Other disorders of lung: Secondary | ICD-10-CM | POA: Diagnosis not present

## 2016-11-08 DIAGNOSIS — J984 Other disorders of lung: Secondary | ICD-10-CM | POA: Diagnosis not present

## 2016-11-19 DIAGNOSIS — E785 Hyperlipidemia, unspecified: Secondary | ICD-10-CM | POA: Diagnosis not present

## 2016-11-19 DIAGNOSIS — E1165 Type 2 diabetes mellitus with hyperglycemia: Secondary | ICD-10-CM | POA: Diagnosis not present

## 2016-11-19 LAB — HEMOGLOBIN A1C: Hemoglobin A1C: 7.5

## 2016-12-09 ENCOUNTER — Other Ambulatory Visit: Payer: Self-pay | Admitting: Internal Medicine

## 2016-12-25 ENCOUNTER — Ambulatory Visit: Payer: Self-pay | Admitting: Internal Medicine

## 2016-12-26 ENCOUNTER — Ambulatory Visit (INDEPENDENT_AMBULATORY_CARE_PROVIDER_SITE_OTHER): Payer: Medicare Other | Admitting: Internal Medicine

## 2016-12-26 ENCOUNTER — Encounter: Payer: Self-pay | Admitting: Internal Medicine

## 2016-12-26 VITALS — BP 120/82 | HR 82 | Ht 70.0 in | Wt 190.2 lb

## 2016-12-26 DIAGNOSIS — J984 Other disorders of lung: Secondary | ICD-10-CM

## 2016-12-26 DIAGNOSIS — Z23 Encounter for immunization: Secondary | ICD-10-CM | POA: Diagnosis not present

## 2016-12-26 DIAGNOSIS — I952 Hypotension due to drugs: Secondary | ICD-10-CM | POA: Diagnosis not present

## 2016-12-26 DIAGNOSIS — E119 Type 2 diabetes mellitus without complications: Secondary | ICD-10-CM

## 2016-12-26 NOTE — Patient Instructions (Signed)
Request report from eye doctor visit to be sent to me please.

## 2016-12-26 NOTE — Progress Notes (Signed)
Date:  12/26/2016   Name:  Marc Bond   DOB:  1945/05/29   MRN:  [id number]   Chief Complaint: Hypotension (Feeling dizzy- stumbling all over the place. BP- 87/65. BS- 112. Has been going on for three weeks. Last episode was last week. )  Low blood pressure - about 3 weeks ago noticed onset of dizziness and imbalance when standing and walking.  Took BP and noted it was low ~90's systolic.  After several weeks, he called and was instructed to stop the medication.  Over the past week the symptoms have completely resolved.   Diabetes  He presents for his follow-up diabetic visit. He has type 2 diabetes mellitus. His disease course has been stable. Hypoglycemia symptoms include dizziness (resolved now). Pertinent negatives for hypoglycemia include no headaches. Pertinent negatives for diabetes include no chest pain and no fatigue. Current diabetic treatment includes oral agent (dual therapy). Compliance with diabetes treatment: followed by Endocrinology. His weight is stable.   Last A1C = 7.5  GFR >60   Cr 1.1  BUN 12  (11/2016)  Review of Systems  Constitutional: Negative for chills, fatigue and fever.  Eyes: Negative for visual disturbance.  Respiratory: Negative for cough, chest tightness, shortness of breath and wheezing.   Cardiovascular: Negative for chest pain, palpitations and leg swelling.  Gastrointestinal: Negative for abdominal pain.  Genitourinary: Negative for difficulty urinating.  Skin: Negative for rash and wound.  Neurological: Positive for dizziness (resolved now). Negative for syncope and headaches.    Patient Active Problem List   Diagnosis Date Noted  . Renal cyst, right 10/29/2016  . Pulmonary cavitary lesion 10/16/2016  . 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 Mood disorder (HCC) 08/20/2016  . Degenerative disc disease, lumbar 09/29/2015  . Primary osteoarthritis of right knee 09/29/2015  . Irritable colon 03/12/2015  . Controlled type 2 diabetes mellitus  without complication (HCC) 03/12/2015  . Dyslipidemia 03/12/2015  . Enlarged prostate 03/12/2015  . Failure of erection 03/12/2015  . Seborrhea capitis 03/12/2015  . Gastroduodenal ulcer 03/12/2015  . Acquired deformities of toe 03/03/2012  . Bilateral cataracts 12/10/2011    Prior to Admission medications   Medication Sig Start Date End Date Taking? Authorizing Provider  alfuzosin (UROXATRAL) 10 MG 24 hr tablet Take 10 mg by mouth daily. 06/26/16  Yes [provider]  celecoxib (CELEBREX) 200 MG capsule TAKE ONE CAPSULE BY MOUTH EVERY DAY 12/09/16  Yes Reubin MilanBerglund, Lillien Petronio H, MD  CIALIS 20 MG tablet take 1 tablet by mouth once daily if needed 06/26/16  Yes [provider]  dicyclomine (BENTYL) 10 MG capsule take 1 capsule by mouth three times a day 05/28/16  Yes Reubin MilanBerglund, Finlee Milo H, MD  glucose blood (ONETOUCH VERIO) test strip ONETOUCH VERIO (In Vitro Strip) - Historical Medication  1 daily Active   Yes [provider]  MULTIPLE VITAMINS PO Take 1 tablet by mouth daily.   Yes [provider]  omeprazole (PRILOSEC) 40 MG capsule TAKE ONE CAPSULE BY MOUTH EVERY DAY 06/01/16  Yes Reubin MilanBerglund, Hilery Wintle H, MD  ramipril (ALTACE) 2.5 MG capsule Take 1 capsule by mouth daily. 07/15/14  Yes [provider]  sertraline (ZOLOFT) 50 MG tablet Take 1 tablet (50 mg total) by mouth daily. 08/20/16  Yes Reubin MilanBerglund, Payslee Bateson H, MD  simvastatin (ZOCOR) 40 MG tablet Take 1 tablet by mouth at bedtime.   Yes [provider]  sitaGLIPtin-metformin (JANUMET) 50-1000 MG tablet Take 1 tablet by mouth 2 (two) times daily.  Yes [provider]    No Known Allergies  Past Surgical History:  Procedure Laterality Date  . COLONOSCOPY  2013    Social History  Substance Use Topics  . Smoking status: Former Games developer  . Smokeless tobacco: Former Neurosurgeon  . Alcohol use No     Medication list has been reviewed and updated.  PHQ 2/9 Scores 10/15/2016 09/29/2015 04/08/2015  PHQ  - 2 Score 0 0 0    Physical Exam  Constitutional: He is oriented to person, place, and time. He appears well-developed. No distress.  HENT:  Head: Normocephalic and atraumatic.  Cardiovascular: Normal rate, regular rhythm and normal heart sounds.   Pulmonary/Chest: Effort normal and breath sounds normal. No respiratory distress.  Musculoskeletal: Normal range of motion.  Neurological: He is alert and oriented to person, place, and time.  Skin: Skin is warm and dry. No rash noted.  Psychiatric: He has a normal mood and affect. His behavior is normal. Thought content normal.  Nursing note and vitals reviewed.   BP 120/82   Pulse 82   Ht  (1.778 m)   Wt 190 lb 3.2 oz (86.3 kg)   SpO2 99%   BMI 27.29 kg/m   Assessment and Plan: 1. Hypotension due to medication Stop altace, monitor renal function and consider low dose ARB instead  2. Controlled type 2 diabetes mellitus without complication, without long-term current use of insulin (HCC) Followed by [facility] - improving  3. Need for influenza vaccination - Flu Vaccine QUAD 36+ mos IM   No orders of the defined types were placed in this encounter.   Partially dictated using Animal nutritionist. Any errors are unintentional.  Bari Edward, MD [facility]  12/26/2016

## 2017-01-11 DIAGNOSIS — R05 Cough: Secondary | ICD-10-CM | POA: Diagnosis not present

## 2017-01-11 DIAGNOSIS — J984 Other disorders of lung: Secondary | ICD-10-CM | POA: Diagnosis not present

## 2017-01-11 DIAGNOSIS — R918 Other nonspecific abnormal finding of lung field: Secondary | ICD-10-CM | POA: Diagnosis not present

## 2017-02-05 DIAGNOSIS — H2513 Age-related nuclear cataract, bilateral: Secondary | ICD-10-CM | POA: Diagnosis not present

## 2017-02-05 DIAGNOSIS — E119 Type 2 diabetes mellitus without complications: Secondary | ICD-10-CM | POA: Diagnosis not present

## 2017-02-05 DIAGNOSIS — H31091 Other chorioretinal scars, right eye: Secondary | ICD-10-CM | POA: Diagnosis not present

## 2017-02-20 ENCOUNTER — Ambulatory Visit (INDEPENDENT_AMBULATORY_CARE_PROVIDER_SITE_OTHER): Payer: Medicare Other | Admitting: Internal Medicine

## 2017-02-20 ENCOUNTER — Encounter: Payer: Self-pay | Admitting: Internal Medicine

## 2017-02-20 VITALS — BP 114/70 | HR 95 | Ht 70.0 in | Wt 195.0 lb

## 2017-02-20 DIAGNOSIS — F39 Unspecified mood [affective] disorder: Secondary | ICD-10-CM

## 2017-02-20 DIAGNOSIS — N400 Enlarged prostate without lower urinary tract symptoms: Secondary | ICD-10-CM

## 2017-02-20 DIAGNOSIS — J984 Other disorders of lung: Secondary | ICD-10-CM

## 2017-02-20 DIAGNOSIS — E119 Type 2 diabetes mellitus without complications: Secondary | ICD-10-CM | POA: Diagnosis not present

## 2017-02-20 DIAGNOSIS — E785 Hyperlipidemia, unspecified: Secondary | ICD-10-CM

## 2017-02-20 NOTE — Progress Notes (Signed)
Date:  02/20/2017   Name:  Marc Bond   DOB:  1945-11-27   MRN:  [id number]   Chief Complaint: Hypertension and Hyperlipidemia Hypertension  This is a chronic problem. The problem is unchanged. The problem is controlled. Associated symptoms include anxiety. Pertinent negatives include no chest pain, headaches, palpitations or shortness of breath. Past treatments include ACE inhibitors (started back on low dose ACE).  Hyperlipidemia  This is a chronic problem. Pertinent negatives include no chest pain or shortness of breath. Current antihyperlipidemic treatment includes statins.  Diabetes  He presents for his follow-up diabetic visit. He has type 2 diabetes mellitus. His disease course has been fluctuating. Hypoglycemia symptoms include nervousness/anxiousness. Pertinent negatives for hypoglycemia include no dizziness or headaches. Pertinent negatives for diabetes include no chest pain. His breakfast blood glucose is taken between 7-8 am. His breakfast blood glucose range is generally 140-180 mg/dl.  Anxiety  Presents for follow-up visit. Symptoms include nervous/anxious behavior. Patient reports no chest pain, dizziness, palpitations or shortness of breath. Symptoms occur occasionally. The quality of sleep is poor.    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  Seen by the Pulmonologist and started on inhaler.  The green sputum is lessening. Urinary issues - followed by Urology.  Needs to cut back on caffeine.  He has tried to change to decaf.  Lab Results  Component Value Date   HGBA1C 7.5 11/19/2016   Lab Results  Component Value Date   CREATININE 1.15 10/22/2016   BUN 15 09/29/2015   NA 136 09/29/2015   K 4.9 09/29/2015   CL 100 09/29/2015   CO2 19 09/29/2015   Lab Results  Component Value Date   CHOL 147 01/20/2016   HDL 44 01/20/2016   LDLCALC 90 01/20/2016   TRIG 63 01/20/2016   CHOLHDL 3.0 09/29/2015      Review of Systems  Constitutional: Negative for  diaphoresis, fever and unexpected weight change.  Respiratory: Positive for cough. Negative for chest tightness, shortness of breath and wheezing.   Cardiovascular: Negative for chest pain, palpitations and leg swelling.  Gastrointestinal: Negative for blood in stool and constipation.  Genitourinary: Positive for frequency and urgency. Negative for hematuria.  Skin: Negative for color change and rash.  Neurological: Positive for numbness (in left 5th finger intermittently). Negative for dizziness and headaches.  Psychiatric/Behavioral: Positive for sleep disturbance. Negative for dysphoric mood. The patient is nervous/anxious.     Patient Active Problem List   Diagnosis Date Noted  . Renal cyst, right 10/29/2016  . Pulmonary cavitary lesion 10/16/2016  . 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 Mood disorder (HCC) 08/20/2016  . Degenerative disc disease, lumbar 09/29/2015  . Primary osteoarthritis of right knee 09/29/2015  . Irritable colon 03/12/2015  . Controlled type 2 diabetes mellitus without complication (HCC) 03/12/2015  . Dyslipidemia 03/12/2015  . Enlarged prostate 03/12/2015  . Failure of erection 03/12/2015  . Seborrhea capitis 03/12/2015  . Gastroduodenal ulcer 03/12/2015  . Acquired deformities of toe 03/03/2012  . Bilateral cataracts 12/10/2011    Prior to Admission medications   Medication Sig Start Date End Date Taking? Authorizing Provider  alfuzosin (UROXATRAL) 10 MG 24 hr tablet Take 10 mg by mouth daily. 06/26/16   [provider]  celecoxib (CELEBREX) 200 MG capsule TAKE ONE CAPSULE BY MOUTH EVERY DAY 12/09/16   Reubin MilanBerglund, Laura H, MD  CIALIS 20 MG tablet take 1 tablet by mouth once daily if needed 06/26/16   [provider]  dicyclomine (BENTYL) 10 MG capsule take 1 capsule by mouth three times a day 05/28/16   Reubin MilanBerglund, Laura H, MD  glucose blood (ONETOUCH VERIO) test strip ONETOUCH VERIO (In Vitro Strip) - Historical Medication  1 daily Active     [provider]  MULTIPLE VITAMINS PO Take 1 tablet by mouth daily.    [provider]  omeprazole (PRILOSEC) 40 MG capsule TAKE ONE CAPSULE BY MOUTH EVERY DAY 06/01/16   Reubin MilanBerglund, Laura H, MD  sertraline (ZOLOFT) 50 MG tablet Take 1 tablet (50 mg total) by mouth daily. 08/20/16   Reubin MilanBerglund, Laura H, MD  simvastatin (ZOCOR) 40 MG tablet Take 1 tablet by mouth at bedtime.    [provider]  sitaGLIPtin-metformin (JANUMET) 50-1000 MG tablet Take 1 tablet by mouth 2 (two) times daily.    [provider]    Allergies  Allergen Reactions  . Ace Inhibitors Other (See Comments)    Sx hypotension on low dose    Past Surgical History:  Procedure Laterality Date  . COLONOSCOPY  2013    Social History   Tobacco Use  . Smoking status: Former Games developermoker  . Smokeless tobacco: Former Engineer, waterUser  Substance Use Topics  . Alcohol use: No    Alcohol/week: 0.0 oz  . Drug use: Not on file     Medication list has been reviewed and updated.  PHQ 2/9 Scores 10/15/2016 09/29/2015 04/08/2015  PHQ - 2 Score 0 0 0    Physical Exam  Constitutional: He is oriented to person, place, and time. He appears well-developed. No distress.  HENT:  Head: Normocephalic and atraumatic.  Pulmonary/Chest: Effort normal. No respiratory distress.  Musculoskeletal: Normal range of motion.  Neurological: He is alert and oriented to person, place, and time.  Skin: Skin is warm and dry. No rash noted.  Psychiatric: He has a normal mood and affect. His behavior is normal. Thought content normal.  Nursing note and vitals reviewed.   BP 114/70   Pulse 95   Ht 5\' 10"  (1.778 m)   Wt 195 lb (88.5 kg)   SpO2 98%   BMI 27.98 kg/m   Assessment and Plan: 1. Controlled type 2 diabetes mellitus without complication, without long-term current use of insulin (HCC) Followed by Endo - Hemoglobin A1c - Basic metabolic panel  2. Dyslipidemia On statins - TSH  3. Enlarged prostate Followed by  Urology - now on pyridium for unclear reasons - CBC with Differential/Platelet  4. Mood disorder (HCC) Stable on zoloft   No orders of the defined types were placed in this encounter.   Partially dictated using Animal nutritionistDragon software. Any errors are unintentional.  Bari EdwardLaura Berglund, MD [facility]  02/20/2017

## 2017-02-20 NOTE — Patient Instructions (Signed)
Work on getting more sleep - at least 6.5 hours per sleep.

## 2017-02-21 LAB — CBC WITH DIFFERENTIAL/PLATELET
Basophils Absolute: 0 10*3/uL (ref 0.0–0.2)
Basos: 0 %
EOS (ABSOLUTE): 0.1 10*3/uL (ref 0.0–0.4)
Eos: 1 %
Hematocrit: 37.8 % (ref 37.5–51.0)
Hemoglobin: 12.9 g/dL — ABNORMAL LOW (ref 13.0–17.7)
IMMATURE GRANS (ABS): 0 10*3/uL (ref 0.0–0.1)
IMMATURE GRANULOCYTES: 0 %
LYMPHS: 26 %
Lymphocytes Absolute: 2.5 10*3/uL (ref 0.7–3.1)
MCH: 28.2 pg (ref 26.6–33.0)
MCHC: 34.1 g/dL (ref 31.5–35.7)
MCV: 83 fL (ref 79–97)
MONOS ABS: 0.6 10*3/uL (ref 0.1–0.9)
Monocytes: 6 %
NEUTROS PCT: 67 %
Neutrophils Absolute: 6.6 10*3/uL (ref 1.4–7.0)
PLATELETS: 268 10*3/uL (ref 150–379)
RBC: 4.58 x10E6/uL (ref 4.14–5.80)
RDW: 14.7 % (ref 12.3–15.4)
WBC: 9.8 10*3/uL (ref 3.4–10.8)

## 2017-02-21 LAB — BASIC METABOLIC PANEL
BUN / CREAT RATIO: 13 (ref 10–24)
BUN: 14 mg/dL (ref 8–27)
CHLORIDE: 102 mmol/L (ref 96–106)
CO2: 26 mmol/L (ref 20–29)
Calcium: 9.8 mg/dL (ref 8.6–10.2)
Creatinine, Ser: 1.1 mg/dL (ref 0.76–1.27)
GFR calc non Af Amer: 67 mL/min/{1.73_m2} (ref >59)
GFR, EST AFRICAN AMERICAN: 77 mL/min/{1.73_m2} (ref >59)
GLUCOSE: 130 mg/dL — AB (ref 65–99)
POTASSIUM: 5.2 mmol/L (ref 3.5–5.2)
SODIUM: 138 mmol/L (ref 134–144)

## 2017-02-21 LAB — HEMOGLOBIN A1C
ESTIMATED AVERAGE GLUCOSE: 169 mg/dL
HEMOGLOBIN A1C: 7.5 % — AB (ref 4.8–5.6)

## 2017-02-21 LAB — TSH: TSH: 1.5 u[IU]/mL (ref 0.450–4.500)

## 2017-03-05 ENCOUNTER — Other Ambulatory Visit: Payer: Self-pay

## 2017-03-05 MED ORDER — OMEPRAZOLE 40 MG PO CPDR: 40.0000 mg | DELAYED_RELEASE_CAPSULE | Freq: Every day | ORAL | 1 refills | Status: DC

## 2017-03-27 DIAGNOSIS — Z23 Encounter for immunization: Secondary | ICD-10-CM | POA: Diagnosis not present

## 2017-03-27 DIAGNOSIS — E1165 Type 2 diabetes mellitus with hyperglycemia: Secondary | ICD-10-CM | POA: Diagnosis not present

## 2017-03-27 DIAGNOSIS — E785 Hyperlipidemia, unspecified: Secondary | ICD-10-CM | POA: Diagnosis not present

## 2017-03-27 DIAGNOSIS — Z87891 Personal history of nicotine dependence: Secondary | ICD-10-CM | POA: Diagnosis not present

## 2017-03-27 DIAGNOSIS — E119 Type 2 diabetes mellitus without complications: Secondary | ICD-10-CM | POA: Diagnosis not present

## 2017-03-27 LAB — HEMOGLOBIN A1C: HEMOGLOBIN A1C: 7.8

## 2017-04-18 ENCOUNTER — Other Ambulatory Visit: Payer: Self-pay | Admitting: Internal Medicine

## 2017-04-18 DIAGNOSIS — H9042 Sensorineural hearing loss, unilateral, left ear, with unrestricted hearing on the contralateral side: Secondary | ICD-10-CM | POA: Diagnosis not present

## 2017-04-18 DIAGNOSIS — E1165 Type 2 diabetes mellitus with hyperglycemia: Secondary | ICD-10-CM | POA: Diagnosis not present

## 2017-04-19 DIAGNOSIS — R918 Other nonspecific abnormal finding of lung field: Secondary | ICD-10-CM | POA: Diagnosis not present

## 2017-04-19 DIAGNOSIS — R05 Cough: Secondary | ICD-10-CM | POA: Diagnosis not present

## 2017-04-19 DIAGNOSIS — J439 Emphysema, unspecified: Secondary | ICD-10-CM | POA: Diagnosis not present

## 2017-04-22 ENCOUNTER — Ambulatory Visit: Payer: Self-pay

## 2017-04-25 DIAGNOSIS — N400 Enlarged prostate without lower urinary tract symptoms: Secondary | ICD-10-CM | POA: Diagnosis not present

## 2017-04-29 DIAGNOSIS — H9042 Sensorineural hearing loss, unilateral, left ear, with unrestricted hearing on the contralateral side: Secondary | ICD-10-CM | POA: Diagnosis not present

## 2017-05-01 ENCOUNTER — Other Ambulatory Visit: Payer: Self-pay | Admitting: Internal Medicine

## 2017-05-17 DIAGNOSIS — J438 Other emphysema: Secondary | ICD-10-CM | POA: Diagnosis not present

## 2017-05-17 DIAGNOSIS — R918 Other nonspecific abnormal finding of lung field: Secondary | ICD-10-CM | POA: Diagnosis not present

## 2017-05-17 DIAGNOSIS — J432 Centrilobular emphysema: Secondary | ICD-10-CM | POA: Diagnosis not present

## 2017-06-07 ENCOUNTER — Other Ambulatory Visit: Payer: Self-pay | Admitting: Internal Medicine

## 2017-07-10 DIAGNOSIS — Z5181 Encounter for therapeutic drug level monitoring: Secondary | ICD-10-CM | POA: Diagnosis not present

## 2017-07-10 DIAGNOSIS — Z87891 Personal history of nicotine dependence: Secondary | ICD-10-CM | POA: Diagnosis not present

## 2017-07-10 DIAGNOSIS — Z79899 Other long term (current) drug therapy: Secondary | ICD-10-CM | POA: Diagnosis not present

## 2017-07-10 DIAGNOSIS — R61 Generalized hyperhidrosis: Secondary | ICD-10-CM | POA: Diagnosis not present

## 2017-07-10 DIAGNOSIS — E1165 Type 2 diabetes mellitus with hyperglycemia: Secondary | ICD-10-CM | POA: Diagnosis not present

## 2017-07-12 ENCOUNTER — Encounter: Payer: Self-pay | Admitting: Internal Medicine

## 2017-07-26 DIAGNOSIS — N400 Enlarged prostate without lower urinary tract symptoms: Secondary | ICD-10-CM | POA: Diagnosis not present

## 2017-07-26 DIAGNOSIS — N529 Male erectile dysfunction, unspecified: Secondary | ICD-10-CM | POA: Diagnosis not present

## 2017-07-30 ENCOUNTER — Other Ambulatory Visit: Payer: Self-pay | Admitting: Internal Medicine

## 2017-08-06 DIAGNOSIS — H31091 Other chorioretinal scars, right eye: Secondary | ICD-10-CM | POA: Diagnosis not present

## 2017-08-06 DIAGNOSIS — H2513 Age-related nuclear cataract, bilateral: Secondary | ICD-10-CM | POA: Diagnosis not present

## 2017-08-06 DIAGNOSIS — E119 Type 2 diabetes mellitus without complications: Secondary | ICD-10-CM | POA: Diagnosis not present

## 2017-08-06 LAB — HM DIABETES EYE EXAM

## 2017-09-10 ENCOUNTER — Encounter: Payer: Self-pay | Admitting: Internal Medicine

## 2017-09-27 DIAGNOSIS — E1165 Type 2 diabetes mellitus with hyperglycemia: Secondary | ICD-10-CM | POA: Diagnosis not present

## 2017-09-29 LAB — CBC AND DIFFERENTIAL
HEMOGLOBIN: 12.9 — AB (ref 13.5–17.5)
PLATELETS: 244 (ref 150–399)
WBC: 8.8

## 2017-09-29 LAB — HEPATIC FUNCTION PANEL
ALT: 19 (ref 10–40)
AST: 20 (ref 14–40)

## 2017-09-29 LAB — HEMOGLOBIN A1C: Hemoglobin A1C: 8.3

## 2017-09-29 LAB — MICROALBUMIN, URINE: Microalb, Ur: NORMAL

## 2017-09-29 LAB — BASIC METABOLIC PANEL
BUN: 15 (ref 4–21)
CREATININE: 1.3 (ref 0.6–1.3)
POTASSIUM: 4.5 (ref 3.4–5.3)
Sodium: 134 — AB (ref 137–147)

## 2017-09-29 LAB — TSH: TSH: 1.3 (ref 0.41–5.90)

## 2017-10-15 ENCOUNTER — Encounter: Payer: Self-pay | Admitting: Internal Medicine

## 2017-10-16 ENCOUNTER — Ambulatory Visit (INDEPENDENT_AMBULATORY_CARE_PROVIDER_SITE_OTHER): Payer: Medicare Other

## 2017-10-16 ENCOUNTER — Encounter: Payer: Self-pay | Admitting: Internal Medicine

## 2017-10-16 ENCOUNTER — Ambulatory Visit: Payer: Self-pay

## 2017-10-16 ENCOUNTER — Ambulatory Visit (INDEPENDENT_AMBULATORY_CARE_PROVIDER_SITE_OTHER): Payer: Medicare Other | Admitting: Internal Medicine

## 2017-10-16 VITALS — BP 102/62 | HR 72 | Temp 97.5°F | Resp 12 | Ht 70.0 in | Wt 189.0 lb

## 2017-10-16 VITALS — BP 96/80 | HR 95 | Resp 16 | Ht 70.0 in | Wt 189.0 lb

## 2017-10-16 DIAGNOSIS — R5383 Other fatigue: Secondary | ICD-10-CM | POA: Diagnosis not present

## 2017-10-16 DIAGNOSIS — Z0000 Encounter for general adult medical examination without abnormal findings: Secondary | ICD-10-CM | POA: Diagnosis not present

## 2017-10-16 DIAGNOSIS — E119 Type 2 diabetes mellitus without complications: Secondary | ICD-10-CM | POA: Diagnosis not present

## 2017-10-16 DIAGNOSIS — E559 Vitamin D deficiency, unspecified: Secondary | ICD-10-CM | POA: Diagnosis not present

## 2017-10-16 NOTE — Patient Instructions (Signed)
Marc Bond , Thank you for taking time to come for your Medicare Wellness Visit. I appreciate your ongoing commitment to your health goals. Please review the following plan we discussed and let me know if I can assist you in the future.   Screening recommendations/referrals: Colorectal Screening: Up to date  Vision and Dental Exams: Recommended annual ophthalmology exams for early detection of glaucoma and other disorders of the eye Recommended annual dental exams for proper oral hygiene  Diabetic Exams: Recommended annual diabetic eye exams for early detection of retinopathy Recommended annual diabetic foot exams for early detection of peripheral neuropathy.  Diabetic Eye Exam: Please schedule an appointment with your ophthalmologist for completion Diabetic Foot Exam: Please keep your appointment as scheduled with Dr. Judithann Graves  Vaccinations: Influenza vaccine: Up to date Pneumococcal vaccine: Up to date Tdap vaccine: Declined. Please call your insurance company to determine your out of pocket expense. You may also receive this vaccine at your local pharmacy or Health Dept. Shingles vaccine: Please call your insurance company to determine your out of pocket expense for the Shingrix vaccine. You may also receive this vaccine at your local pharmacy or Health Dept.  Advanced directives: Advance directive discussed with you today. I have provided a copy for you to complete at home and have notarized. Once this is complete please bring a copy in to our office so we can scan it into your chart.  OR  We have received a copy of your POA (Power of Attorney) and/or Living Will. These documents can be located in your chart.  OR  Please bring a copy of your POA (Power of Attorney) and/or Living Will to your next appointment.  Goals: Recommend to drink at least 6-8 8oz glasses of water per day.  Next appointment: Please schedule your Annual Wellness Visit with your Nurse Health Advisor in one  year.  Preventive Care 56 Years and Older, Male Preventive care refers to lifestyle choices and visits with your health care provider that can promote health and wellness. What does preventive care include?  A yearly physical exam. This is also called an annual well check.  Dental exams once or twice a year.  Routine eye exams. Ask your health care provider how often you should have your eyes checked.  Personal lifestyle choices, including:  Daily care of your teeth and gums.  Regular physical activity.  Eating a healthy diet.  Avoiding tobacco and drug use.  Limiting alcohol use.  Practicing safe sex.  Taking low doses of aspirin every day.  Taking vitamin and mineral supplements as recommended by your health care provider. What happens during an annual well check? The services and screenings done by your health care provider during your annual well check will depend on your age, overall health, lifestyle risk factors, and family history of disease. Counseling  Your health care provider may ask you questions about your:  Alcohol use.  Tobacco use.  Drug use.  Emotional well-being.  Home and relationship well-being.  Sexual activity.  Eating habits.  History of falls.  Memory and ability to understand (cognition).  Work and work Astronomer. Screening  You may have the following tests or measurements:  Height, weight, and BMI.  Blood pressure.  Lipid and cholesterol levels. These may be checked every 5 years, or more frequently if you are over 75 years old.  Skin check.  Lung cancer screening. You may have this screening every year starting at age 45 if you have a 30-pack-year history of  smoking and currently smoke or have quit within the past 15 years.  Fecal occult blood test (FOBT) of the stool. You may have this test every year starting at age 10150.  Flexible sigmoidoscopy or colonoscopy. You may have a sigmoidoscopy every 5 years or a colonoscopy  every 10 years starting at age 73.  Prostate cancer screening. Recommendations will vary depending on your family history and other risks.  Hepatitis C blood test.  Hepatitis B blood test.  Sexually transmitted disease (STD) testing.  Diabetes screening. This is done by checking your blood sugar (glucose) after you have not eaten for a while (fasting). You may have this done every 1-3 years.  Abdominal aortic aneurysm (AAA) screening. You may need this if you are a current or former smoker.  Osteoporosis. You may be screened starting at age 570 if you are at high risk. Talk with your health care provider about your test results, treatment options, and if necessary, the need for more tests. Vaccines  Your health care provider may recommend certain vaccines, such as:  Influenza vaccine. This is recommended every year.  Tetanus, diphtheria, and acellular pertussis (Tdap, Td) vaccine. You may need a Td booster every 10 years.  Zoster vaccine. You may need this after age 73.  Pneumococcal 13-valent conjugate (PCV13) vaccine. One dose is recommended after age 865.  Pneumococcal polysaccharide (PPSV23) vaccine. One dose is recommended after age 73. Talk to your health care provider about which screenings and vaccines you need and how often you need them. This information is not intended to replace advice given to you by your health care provider. Make sure you discuss any questions you have with your health care provider. Document Released: 04/29/2015 Document Revised: 12/21/2015 Document Reviewed: 02/01/2015 Elsevier Interactive Patient Education  2017 [employer]  Fall Prevention in the Home Falls can cause injuries. They can happen to people of all ages. There are many things you can do to make your home safe and to help prevent falls. What can I do on the outside of my home?  Regularly fix the edges of walkways and driveways and fix any cracks.  Remove anything that might make  you trip as you walk through a door, such as a raised step or threshold.  Trim any bushes or trees on the path to your home.  Use bright outdoor lighting.  Clear any walking paths of anything that might make someone trip, such as rocks or tools.  Regularly check to see if handrails are loose or broken. Make sure that both sides of any steps have handrails.  Any raised decks and porches should have guardrails on the edges.  Have any leaves, snow, or ice cleared regularly.  Use sand or salt on walking paths during winter.  Clean up any spills in your garage right away. This includes oil or grease spills. What can I do in the bathroom?  Use night lights.  Install grab bars by the toilet and in the tub and shower. Do not use towel bars as grab bars.  Use non-skid mats or decals in the tub or shower.  If you need to sit down in the shower, use a plastic, non-slip stool.  Keep the floor dry. Clean up any water that spills on the floor as soon as it happens.  Remove soap buildup in the tub or shower regularly.  Attach bath mats securely with double-sided non-slip rug tape.  Do not have throw rugs and other things on the floor that  can make you trip. What can I do in the bedroom?  Use night lights.  Make sure that you have a light by your bed that is easy to reach.  Do not use any sheets or blankets that are too big for your bed. They should not hang down onto the floor.  Have a firm chair that has side arms. You can use this for support while you get dressed.  Do not have throw rugs and other things on the floor that can make you trip. What can I do in the kitchen?  Clean up any spills right away.  Avoid walking on wet floors.  Keep items that you use a lot in easy-to-reach places.  If you need to reach something above you, use a strong step stool that has a grab bar.  Keep electrical cords out of the way.  Do not use floor polish or wax that makes floors slippery.  If you must use wax, use non-skid floor wax.  Do not have throw rugs and other things on the floor that can make you trip. What can I do with my stairs?  Do not leave any items on the stairs.  Make sure that there are handrails on both sides of the stairs and use them. Fix handrails that are broken or loose. Make sure that handrails are as long as the stairways.  Check any carpeting to make sure that it is firmly attached to the stairs. Fix any carpet that is loose or worn.  Avoid having throw rugs at the top or bottom of the stairs. If you do have throw rugs, attach them to the floor with carpet tape.  Make sure that you have a light switch at the top of the stairs and the bottom of the stairs. If you do not have them, ask someone to add them for you. What else can I do to help prevent falls?  Wear shoes that:  Do not have high heels.  Have rubber bottoms.  Are comfortable and fit you well.  Are closed at the toe. Do not wear sandals.  If you use a stepladder:  Make sure that it is fully opened. Do not climb a closed stepladder.  Make sure that both sides of the stepladder are locked into place.  Ask someone to hold it for you, if possible.  Clearly mark and make sure that you can see:  Any grab bars or handrails.  First and last steps.  Where the edge of each step is.  Use tools that help you move around (mobility aids) if they are needed. These include:  Canes.  Walkers.  Scooters.  Crutches.  Turn on the lights when you go into a dark area. Replace any light bulbs as soon as they burn out.  Set up your furniture so you have a clear path. Avoid moving your furniture around.  If any of your floors are uneven, fix them.  If there are any pets around you, be aware of where they are.  Review your medicines with your doctor. Some medicines can make you feel dizzy. This can increase your chance of falling. Ask your doctor what other things that you can do to  help prevent falls. This information is not intended to replace advice given to you by your health care provider. Make sure you discuss any questions you have with your health care provider. Document Released: 01/27/2009 Document Revised: 09/08/2015 Document Reviewed: 05/07/2014 Elsevier Interactive Patient Education  2017 [employer].

## 2017-10-16 NOTE — Patient Instructions (Signed)
Stop Ramipril - temporarily until you see Endo again  Start Vitamin D supplement

## 2017-10-16 NOTE — Progress Notes (Signed)
Date:  10/16/2017   Name:  Marc Bond   DOB:  26-Mar-1946   MRN:  [id number]   Chief Complaint: Fatigue HPI Over the past 2 years he has noticed more fatigue and slower to get things done.  He is still walking but not as vigorously as previously.  He still cuts his grass with a self propelled mower but feels very fatigued afterwards. He stays very active but has had to slow down. He has no chest pain, sob but feels weak. No palpitations. Legs sometimes feel heavy and he has to sit and rest.  He wonders if he has low vitamin D.  He thinks he has been tested low in the past but he does not know when or where. He started Jardiance about 2 weeks ago - sx may be worse since then.  He is outside in the heat regularly and may not be drinking enough fluids.  He did feel especially weak after working outside all day earlier this week.  Lab Results  Component Value Date   CREATININE 1.3 09/29/2017   BUN 15 09/29/2017   NA 134 (A) 09/29/2017   K 4.5 09/29/2017   CL 102 02/20/2017   CO2 26 02/20/2017   Lab Results  Component Value Date   WBC 8.8 09/29/2017   HGB 12.9 (A) 09/29/2017   HCT 37.8 02/20/2017   MCV 83 02/20/2017   PLT 244 09/29/2017   Lab Results  Component Value Date   TSH 1.30 09/29/2017    Review of Systems  Constitutional: Positive for fatigue. Negative for appetite change, chills, diaphoresis and unexpected weight change.  Eyes: Negative for visual disturbance.  Respiratory: Negative for cough, shortness of breath and wheezing.   Cardiovascular: Negative for chest pain, palpitations and leg swelling.  Gastrointestinal: Positive for constipation. Negative for abdominal pain, blood in stool and vomiting.  Endocrine: Negative for polydipsia and polyuria.  Genitourinary: Negative for dysuria and hematuria.  Skin: Negative for color change and rash.  Neurological: Positive for weakness and light-headedness. Negative for dizziness, tremors, numbness and headaches.    Hematological: Negative for adenopathy.  Psychiatric/Behavioral: Negative for dysphoric mood, sleep disturbance and suicidal ideas. The patient is not nervous/anxious.     Patient Active Problem List   Diagnosis Date Noted  . 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 Renal cyst, right 10/29/2016  . Pulmonary cavitary lesion 10/16/2016  . 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 Mood disorder (HCC) 08/20/2016  . Degenerative disc disease, lumbar 09/29/2015  . Primary osteoarthritis of right knee 09/29/2015  . Irritable colon 03/12/2015  . Controlled type 2 diabetes mellitus without complication (HCC) 03/12/2015  . Dyslipidemia 03/12/2015  . Enlarged prostate 03/12/2015  . Failure of erection 03/12/2015  . Seborrhea capitis 03/12/2015  . Gastroduodenal ulcer 03/12/2015  . Acquired deformities of toe 03/03/2012  . Bilateral cataracts 12/10/2011    Prior to Admission medications   Medication Sig Start Date End Date Taking? Authorizing Provider  alfuzosin (UROXATRAL) 10 MG 24 hr tablet Take 10 mg by mouth daily. 06/26/16  Yes [provider]  Budesonide 90 MCG/ACT inhaler Inhale 1 puff 2 (two) times daily into the lungs.   Yes [provider]  celecoxib (CELEBREX) 200 MG capsule TAKE ONE CAPSULE BY MOUTH EVERY DAY 06/07/17  Yes Reubin Milan, MD  CIALIS 20 MG tablet take 1 tablet by mouth once daily if needed 06/26/16  Yes [provider]  dicyclomine (BENTYL) 10 MG capsule TAKE 1 CAPSULE BY  MOUTH THREE TIMES A DAY 07/30/17  Yes Reubin Milan, MD  empagliflozin (JARDIANCE) 10 MG TABS tablet Take by mouth. 09/27/17  Yes [provider]  glucose blood (ONETOUCH VERIO) test strip ONETOUCH VERIO (In Vitro Strip) - Historical Medication  1 daily Active   Yes [provider]  MULTIPLE VITAMINS PO Take 1 tablet by mouth daily.   Yes [provider]  omeprazole (PRILOSEC) 40 MG capsule Take 1 capsule (40 mg total) by mouth daily.  03/05/17  Yes Reubin Milan, MD  ramipril (ALTACE) 2.5 MG capsule Take 2.5 mg daily by mouth.   Yes [provider]  simvastatin (ZOCOR) 40 MG tablet Take 1 tablet by mouth at bedtime.   Yes [provider]  sitaGLIPtin-metformin (JANUMET) 50-1000 MG tablet Take 1 tablet by mouth 2 (two) times daily.   Yes [provider]    Allergies  Allergen Reactions  . Ace Inhibitors Other (See Comments)    Sx hypotension on low dose    Past Surgical History:  Procedure Laterality Date  . COLONOSCOPY  11/2011   normal - 10 yr follow up Dr. Earlean Polka    Social History   Tobacco Use  . Smoking status: Former Games developer  . Smokeless tobacco: Former Engineer, water Use Topics  . Alcohol use: No    Alcohol/week: 0.0 oz  . Drug use: Not on file     Medication list has been reviewed and updated.  Current Meds  Medication Sig  . alfuzosin (UROXATRAL) 10 MG 24 hr tablet Take 10 mg by mouth daily.  . Budesonide 90 MCG/ACT inhaler Inhale 1 puff 2 (two) times daily into the lungs.  . celecoxib (CELEBREX) 200 MG capsule TAKE ONE CAPSULE BY MOUTH EVERY DAY  . CIALIS 20 MG tablet take 1 tablet by mouth once daily if needed  . dicyclomine (BENTYL) 10 MG capsule TAKE 1 CAPSULE BY MOUTH THREE TIMES A DAY  . empagliflozin (JARDIANCE) 10 MG TABS tablet Take by mouth.  Marland Kitchen glucose blood (ONETOUCH VERIO) test strip ONETOUCH VERIO (In Vitro Strip) - Historical Medication  1 daily Active  . MULTIPLE VITAMINS PO Take 1 tablet by mouth daily.  Marland Kitchen omeprazole (PRILOSEC) 40 MG capsule Take 1 capsule (40 mg total) by mouth daily.  . ramipril (ALTACE) 2.5 MG capsule Take 2.5 mg daily by mouth.  . simvastatin (ZOCOR) 40 MG tablet Take 1 tablet by mouth at bedtime.  . sitaGLIPtin-metformin (JANUMET) 50-1000 MG tablet Take 1 tablet by mouth 2 (two) times daily.  . [DISCONTINUED] sertraline (ZOLOFT) 50 MG tablet TAKE 1 TABLET BY MOUTH ONCE DAILY    PHQ 2/9 Scores 10/16/2017 10/15/2016 09/29/2015  04/08/2015  PHQ - 2 Score 0 0 0 0    Physical Exam  Constitutional: He is oriented to person, place, and time. He appears well-developed. No distress.  HENT:  Head: Normocephalic and atraumatic.  Neck: Normal range of motion. Neck supple.  Cardiovascular: Normal rate, regular rhythm and normal heart sounds.  No extrasystoles are present.  No murmur heard. Pulses:      Dorsalis pedis pulses are 1+ on the right side, and 1+ on the left side.       Posterior tibial pulses are 1+ on the right side, and 1+ on the left side.  Pulmonary/Chest: Effort normal and breath sounds normal. No respiratory distress. He has no wheezes.  Abdominal: Soft. Bowel sounds are normal.  Musculoskeletal: Normal range of motion.  Lymphadenopathy:    He has  no cervical adenopathy.  Neurological: He is alert and oriented to person, place, and time.  Skin: Skin is warm and dry. No rash noted.  Psychiatric: He has a normal mood and affect. His speech is normal and behavior is normal. Thought content normal. Cognition and memory are normal.  Nursing note and vitals reviewed.   BP 96/80   Pulse 95   Resp 16   Ht 5\' 10"  (1.778 m)   Wt 189 lb (85.7 kg)   SpO2 99%   BMI 27.12 kg/m   Assessment and Plan: 1. Fatigue, unspecified type [facility] but Hgb slightly low so will repeat - CBC with Differential/Platelet  2. Controlled type 2 diabetes mellitus without complication, without long-term current use of insulin (HCC) Recently started Jardiance - check for renal insufficiency/dehydration Encourage pt to intake more fluids - Comprehensive metabolic panel  3. Vitamin D deficiency Check level He can start supplement that he already purchased - VITAMIN D 25 Hydroxy (Vit-D Deficiency, Fractures)   No orders of the defined types were placed in this encounter.   Partially dictated using Animal nutritionistDragon software. Any errors are unintentional.  Bari EdwardLaura Shatona Andujar, MD [facility]  10/16/2017

## 2017-10-16 NOTE — Progress Notes (Signed)
Subjective:   Marc Bond is a 72 y.o. male who presents for Medicare Annual/Subsequent preventive examination.  Review of Systems:  N/A Cardiac Risk Factors include: diabetes mellitus;male gender;advanced age (>94men, >30 women);dyslipidemia;sedentary lifestyle     Objective:    Vitals: BP 102/62 (BP Location: Right Arm, Patient Position: Sitting, Cuff Size: Normal)   Pulse 72   Temp (!) 97.5 F (36.4 C) (Oral)   Resp 12   Ht 5\' 10"  (1.778 m)   Wt 189 lb (85.7 kg)   SpO2 95%   BMI 27.12 kg/m   Body mass index is 27.12 kg/m.  Advanced Directives 10/16/2017 09/29/2015  Does Patient Have a Medical Advance Directive? Yes Yes  Type of Estate agent of [place];Living will Healthcare Power of [place];Living will  Does patient want to make changes to medical advance directive? - No - Patient declined  Copy of Healthcare Power of Attorney in Chart? No - copy requested -    Tobacco Social History   Tobacco Use  Smoking Status Former Smoker  . Packs/day: 0.50  . Years: 13.00  . Pack years: 6.50  . Types: Cigarettes, Pipe, Cigars  . Last attempt to quit: 1980  . Years since quitting: 39.5  Smokeless Tobacco Never Used  Tobacco Comment   smoking cessation materials not required     Counseling given: No Comment: smoking cessation materials not required  Clinical Intake:  Pre-visit preparation completed: Yes  Pain : No/denies pain   BMI - recorded: 27.12 Nutritional Status: BMI 25 -29 Overweight Nutritional Risks: None  Nutrition Risk Assessment: Has the patient had any N/V/D within the last 2 months?  No Does the patient have any non-healing wounds?  No Has the patient had any unintentional weight loss or weight gain?  No  Is the patient diabetic?  Yes If diabetic, was a CBG obtained today?  No Did the patient bring in their glucometer from home?  No Comments: Pt monitors CBG's twice daily. Denies any financial strains with the device or  supplies.  Diabetic Exams: Diabetic Eye Exam: Completed 01/27/16. Pt states he has had an eye xam recently but is unable to recall the date of completion. Pt has been advised to provide a copy of his ophthalmology records from [facility]. Diabetic Foot Exam: Completed 12/26/16. Pt is scheduled for diabetic foot exam on 10/18/17 with Dr. Judithann Graves.  How often do you need to have someone help you when you read instructions, pamphlets, or other written materials from your doctor or pharmacy?: 1 - Never  Interpreter Needed?: No  Information entered by :: Martina Sinner, LPN  Past Medical History:  Diagnosis Date  . Mood disorder (HCC) 08/20/2016   Past Surgical History:  Procedure Laterality Date  . COLONOSCOPY  11/2011   normal - 10 yr follow up Dr. Earlean Polka   Family History  Problem Relation Age of Onset  . Hypertension Sister   . Kidney disease Mother   . Hypertension Mother   . Emphysema Father   . Diabetes Brother   . Diabetes Sister   . Diabetes Brother    Social History   Socioeconomic History  . Marital status: Widowed    Spouse name: Not on file  . Number of children: 2  . Years of education: Not on file  . Highest education level: 11th grade  Occupational History  . Occupation: Retired  Engineer, production  . Financial resource strain: Not hard at all  . Food insecurity:    Worry: Never true  Inability: Never true  . Transportation needs:    Medical: No    Non-medical: No  Tobacco Use  . Smoking status: Former Smoker    Packs/day: 0.50    Years: 13.00    Pack years: 6.50    Types: Cigarettes, Pipe, Cigars    Last attempt to quit: 1980    Years since quitting: 39.5  . Smokeless tobacco: Never Used  . Tobacco comment: smoking cessation materials not required  Substance and Sexual Activity  . Alcohol use: No    Alcohol/week: 0.0 oz  . Drug use: Not Currently  . Sexual activity: Not Currently  Lifestyle  . Physical activity:    Days per week: 0 days    Minutes per session:  0 min  . Stress: Not at all  Relationships  . Social connections:    Talks on phone: Patient refused    Gets together: Patient refused    Attends religious service: Patient refused    Active member of club or organization: Patient refused    Attends meetings of clubs or organizations: Patient refused    Relationship status: Widowed  Other Topics Concern  . Not on file  Social History Narrative  . Not on file    Outpatient Encounter Medications as of 10/16/2017  Medication Sig  . alfuzosin (UROXATRAL) 10 MG 24 hr tablet Take 10 mg by mouth daily.  . Budesonide 90 MCG/ACT inhaler Inhale 1 puff 2 (two) times daily into the lungs.  . celecoxib (CELEBREX) 200 MG capsule TAKE ONE CAPSULE BY MOUTH EVERY DAY  . CIALIS 20 MG tablet take 1 tablet by mouth once daily if needed  . dicyclomine (BENTYL) 10 MG capsule TAKE 1 CAPSULE BY MOUTH THREE TIMES A DAY  . empagliflozin (JARDIANCE) 10 MG TABS tablet Take by mouth.  Marland Kitchen glucose blood (ONETOUCH VERIO) test strip ONETOUCH VERIO (In Vitro Strip) - Historical Medication  1 daily Active  . MULTIPLE VITAMINS PO Take 1 tablet by mouth daily.  Marland Kitchen omeprazole (PRILOSEC) 40 MG capsule Take 1 capsule (40 mg total) by mouth daily.  . ramipril (ALTACE) 2.5 MG capsule Take 2.5 mg daily by mouth.  . simvastatin (ZOCOR) 40 MG tablet Take 1 tablet by mouth at bedtime.  . sitaGLIPtin-metformin (JANUMET) 50-1000 MG tablet Take 1 tablet by mouth 2 (two) times daily.   No facility-administered encounter medications on file as of 10/16/2017.     Activities of Daily Living In your present state of health, do you have any difficulty performing the following activities: 10/16/2017  Hearing? N  Comment denies hearing aids  Vision? N  Comment wears eyeglasses  Difficulty concentrating or making decisions? N  Walking or climbing stairs? N  Dressing or bathing? N  Doing errands, shopping? N  Preparing Food and eating ? N  Comment denies dentures  Using the Toilet? N    In the past six months, have you accidently leaked urine? N  Do you have problems with loss of bowel control? N  Managing your Medications? N  Managing your Finances? N  Housekeeping or managing your Housekeeping? N  Some recent data might be hidden    Patient Care Team: Reubin Milan, MD as PCP - General (Internal Medicine) Jeronimo Norma (Inactive) as Physician Assistant (Endocrinology) Victorino December, MD as Referring Physician (Pulmonary Disease) [facility] (Urology) Lacey Jensen, MD as Consulting Physician (Gastroenterology) Desma Paganini, AUD as Consulting Physician (Audiology)   Assessment:   This is a routine wellness examination for [place].  Exercise Activities and Dietary recommendations Current Exercise Habits: The patient does not participate in regular exercise at present, Exercise limited by: None identified  Goals    . DIET - INCREASE WATER INTAKE     Recommend to drink at least 6-8 8oz glasses of water per day.       Fall Risk Fall Risk  10/16/2017 10/16/2017 10/15/2016 09/29/2015 04/08/2015  Falls in the past year? No No No No No  Risk for fall due to : Impaired vision;Other (Comment) - - - -  Risk for fall due to: Comment wears eyeglasses, fatigue - - - -   FALL RISK PREVENTION PERTAINING TO HOME: Is your home free of loose throw rugs in walkways, pet beds, electrical cords, etc? Yes Is there adequate lighting in your home to reduce risk of falls?  Yes Are there stairs in or around your home WITH handrails? No stairs  ASSISTIVE DEVICES UTILIZED TO PREVENT FALLS: Use of a cane, walker or w/c? No Grab bars in the bathroom? Yes  Shower chair or a place to sit while bathing? Yes An elevated toilet seat or a handicapped toilet? Yes  Timed Get Up and Go Performed: Yes. Pt ambulated 10 feet within 9 sec. Gait stead-fast and without the use of an assistive device. No intervention required at this time. Fall risk prevention has been  discussed.  Community Resource Referral:  [employer] Referral not required at this time.  Depression Screen PHQ 2/9 Scores 10/16/2017 10/16/2017 10/15/2016 09/29/2015  PHQ - 2 Score 0 0 0 0  PHQ- 9 Score 0 - - -    Cognitive Function     6CIT Screen 10/16/2017  What Year? 0 points  What month? 0 points  What time? 0 points  Count back from 20 0 points  Months in reverse 0 points  Repeat phrase 10 points  Total Score 10    Immunization History  Administered Date(s) Administered  . Influenza,inj,Quad PF,6+ Mos 12/26/2016  . Influenza-Unspecified 02/16/2015, 12/26/2016  . Pneumococcal Conjugate-13 09/29/2015, 03/27/2017  . Pneumococcal Polysaccharide-23 04/17/2008, 12/04/2012    Qualifies for Shingles Vaccine? Yes. Due for Shingrix. Education has been provided regarding the importance of this vaccine. Pt has been advised to call his insurance company to determine his out of pocket expense. Advised he may also receive this vaccine at his local pharmacy or Health Dept. Verbalized acceptance and understanding.  Due for Tdap vaccine. Education has been provided regarding the importance of this vaccine. Pt has been advised he may receive this vaccine at his local pharmacy or Health Dept. Also advised to provide a copy of his vaccination record if he chooses to receive this vaccine at his local pharmacy. Verbalized acceptance and understanding.  Screening Tests Health Maintenance  Topic Date Due  . OPHTHALMOLOGY EXAM  01/26/2017  . HEMOGLOBIN A1C  09/25/2017  . TETANUS/TDAP  02/20/2018 (Originally 07/12/1964)  . INFLUENZA VACCINE  11/14/2017  . FOOT EXAM  10/17/2018  . COLONOSCOPY  11/25/2021  . PNA vac Low Risk Adult  Completed  . Hepatitis C Screening  Addressed   Cancer Screenings: Lung: Low Dose CT Chest recommended if Age 57-80 years, 30 pack-year currently smoking OR have quit w/in 15years. Patient does not qualify. Colorectal: Completed 11/26/11. Repeat every 10  years  Additional Screenings: Hepatitis C Screening: Completed 02/21/12     Plan:  I have personally reviewed and addressed the Medicare Annual Wellness questionnaire and have noted the following in the patient's chart:  A. Medical and  social history B. Use of alcohol, tobacco or illicit drugs  C. Current medications and supplements D. Functional ability and status E.  Nutritional status F.  Physical activity G. Advance directives H. List of other physicians I.  Hospitalizations, surgeries, and ER visits in previous 12 months J.  Vitals K. Screenings such as hearing and vision if needed, cognitive and depression L. Referrals and appointments  In addition, I have reviewed and discussed with patient certain preventive protocols, quality metrics, and best practice recommendations. A written personalized care plan for preventive services as well as general preventive health recommendations were provided to patient.  Signed,  Deon PillingAmmie Maely Clements, LPN Nurse Health Advisor  MD Recommendations: Due for Shingrix. Education has been provided regarding the importance of this vaccine. Pt has been advised to call his insurance company to determine his out of pocket expense. Advised he may also receive this vaccine at his local pharmacy or Health Dept. Verbalized acceptance and understanding.  Due for Tdap vaccine. Education has been provided regarding the importance of this vaccine. Pt has been advised he may receive this vaccine at his local pharmacy or Health Dept. Also advised to provide a copy of his vaccination record if he chooses to receive this vaccine at his local pharmacy. Verbalized acceptance and understanding.  Diabetic Foot Exam: Completed 12/26/16. Pt is scheduled for diabetic foot exam on 10/18/17 with Dr. Judithann GravesBerglund.  Diabetic Eye Exam: Completed 01/27/16. Pt states he has had an eye xam recently but is unable to recall the date of completion. Pt has been advised to provide a copy of his  ophthalmology records from [facility].

## 2017-10-17 LAB — COMPREHENSIVE METABOLIC PANEL
A/G RATIO: 1.1 — AB (ref 1.2–2.2)
ALT: 15 IU/L (ref 0–44)
AST: 18 IU/L (ref 0–40)
Albumin: 4.4 g/dL (ref 3.5–4.8)
Alkaline Phosphatase: 78 IU/L (ref 39–117)
BUN/Creatinine Ratio: 15 (ref 10–24)
BUN: 21 mg/dL (ref 8–27)
Bilirubin Total: 0.4 mg/dL (ref 0.0–1.2)
CALCIUM: 10.1 mg/dL (ref 8.6–10.2)
CO2: 21 mmol/L (ref 20–29)
Chloride: 100 mmol/L (ref 96–106)
Creatinine, Ser: 1.44 mg/dL — ABNORMAL HIGH (ref 0.76–1.27)
GFR calc Af Amer: 55 mL/min/{1.73_m2} — ABNORMAL LOW (ref >59)
GFR calc non Af Amer: 48 mL/min/{1.73_m2} — ABNORMAL LOW (ref >59)
GLOBULIN, TOTAL: 4 g/dL (ref 1.5–4.5)
Glucose: 111 mg/dL — ABNORMAL HIGH (ref 65–99)
POTASSIUM: 5.2 mmol/L (ref 3.5–5.2)
Sodium: 134 mmol/L (ref 134–144)
TOTAL PROTEIN: 8.4 g/dL (ref 6.0–8.5)

## 2017-10-17 LAB — CBC WITH DIFFERENTIAL/PLATELET
BASOS: 0 %
Basophils Absolute: 0 10*3/uL (ref 0.0–0.2)
EOS (ABSOLUTE): 0.1 10*3/uL (ref 0.0–0.4)
EOS: 1 %
HEMATOCRIT: 38 % (ref 37.5–51.0)
Hemoglobin: 13.4 g/dL (ref 13.0–17.7)
IMMATURE GRANS (ABS): 0 10*3/uL (ref 0.0–0.1)
IMMATURE GRANULOCYTES: 0 %
LYMPHS: 23 %
Lymphocytes Absolute: 1.8 10*3/uL (ref 0.7–3.1)
MCH: 28 pg (ref 26.6–33.0)
MCHC: 35.3 g/dL (ref 31.5–35.7)
MCV: 80 fL (ref 79–97)
MONOCYTES: 6 %
Monocytes Absolute: 0.5 10*3/uL (ref 0.1–0.9)
NEUTROS PCT: 70 %
Neutrophils Absolute: 5.6 10*3/uL (ref 1.4–7.0)
PLATELETS: 249 10*3/uL (ref 150–450)
RBC: 4.78 x10E6/uL (ref 4.14–5.80)
RDW: 14.7 % (ref 12.3–15.4)
WBC: 8.1 10*3/uL (ref 3.4–10.8)

## 2017-10-17 LAB — VITAMIN D 25 HYDROXY (VIT D DEFICIENCY, FRACTURES): Vit D, 25-Hydroxy: 42.6 ng/mL (ref 30.0–100.0)

## 2017-10-18 ENCOUNTER — Ambulatory Visit: Payer: Self-pay | Admitting: Internal Medicine

## 2017-10-21 ENCOUNTER — Ambulatory Visit: Payer: Self-pay

## 2017-10-22 DIAGNOSIS — H9042 Sensorineural hearing loss, unilateral, left ear, with unrestricted hearing on the contralateral side: Secondary | ICD-10-CM | POA: Diagnosis not present

## 2017-10-24 DIAGNOSIS — L03011 Cellulitis of right finger: Secondary | ICD-10-CM | POA: Diagnosis not present

## 2017-10-24 DIAGNOSIS — Z23 Encounter for immunization: Secondary | ICD-10-CM | POA: Diagnosis not present

## 2017-10-30 DIAGNOSIS — E1165 Type 2 diabetes mellitus with hyperglycemia: Secondary | ICD-10-CM | POA: Diagnosis not present

## 2017-11-04 ENCOUNTER — Other Ambulatory Visit: Payer: Self-pay | Admitting: Internal Medicine

## 2017-11-07 ENCOUNTER — Ambulatory Visit (INDEPENDENT_AMBULATORY_CARE_PROVIDER_SITE_OTHER): Payer: Medicare Other | Admitting: Internal Medicine

## 2017-11-07 ENCOUNTER — Encounter: Payer: Self-pay | Admitting: Internal Medicine

## 2017-11-07 VITALS — BP 128/74 | HR 89 | Ht 70.0 in | Wt 190.2 lb

## 2017-11-07 DIAGNOSIS — E785 Hyperlipidemia, unspecified: Secondary | ICD-10-CM

## 2017-11-07 DIAGNOSIS — K589 Irritable bowel syndrome without diarrhea: Secondary | ICD-10-CM

## 2017-11-07 DIAGNOSIS — N400 Enlarged prostate without lower urinary tract symptoms: Secondary | ICD-10-CM

## 2017-11-07 DIAGNOSIS — E119 Type 2 diabetes mellitus without complications: Secondary | ICD-10-CM

## 2017-11-07 DIAGNOSIS — M1711 Unilateral primary osteoarthritis, right knee: Secondary | ICD-10-CM

## 2017-11-07 LAB — POCT URINALYSIS DIPSTICK
BILIRUBIN UA: NEGATIVE
Blood, UA: NEGATIVE
GLUCOSE UA: POSITIVE — AB
Ketones, UA: NEGATIVE
Leukocytes, UA: NEGATIVE
Nitrite, UA: NEGATIVE
PH UA: 6 (ref 5.0–8.0)
Protein, UA: NEGATIVE
Spec Grav, UA: 1.01 (ref 1.010–1.025)
UROBILINOGEN UA: 0.2 U/dL

## 2017-11-07 NOTE — Progress Notes (Signed)
Date:  11/07/2017   Name:  Marc Bond   DOB:  March 08, 1946   MRN:  [id number]   Chief Complaint: No chief complaint on file. Diabetes  He presents for his follow-up diabetic visit. He has type 2 diabetes mellitus. His disease course has been improving. Pertinent negatives for hypoglycemia include no dizziness, headaches or nervousness/anxiousness. Pertinent negatives for diabetes include no chest pain and no fatigue. Symptoms are improving. There are no diabetic complications. Current diabetic treatment includes oral agent (triple therapy). He is compliant with treatment all of the time. His weight is decreasing steadily. He is following a generally healthy diet. His home blood glucose trend is decreasing steadily. An ACE inhibitor/angiotensin II receptor blocker is contraindicated (stop ACE due to low BP - even low dose not tolerated). Eye exam is current.  Hyperlipidemia  This is a chronic problem. The problem is controlled. Pertinent negatives include no chest pain, myalgias or shortness of breath. Current antihyperlipidemic treatment includes statins. The current treatment provides significant improvement of lipids.  Knee Pain   There was no injury mechanism. The pain is present in the right knee. The quality of the pain is described as aching. The pain is mild. The pain has been fluctuating since onset. He has tried NSAIDs for the symptoms. The treatment provided significant relief.   IBS - on Bentyl to control sx and doing well. Last colonoscopy was in 2013 and was normal.  He has been unable to tolerate low dose ACE due to low BP.  He is very active and finds himself lightheaded and weak frequently.  He stopped the ramipril and has felt much better. Lab Results  Component Value Date   CHOL 147 01/20/2016   HDL 44 01/20/2016   LDLCALC 90 01/20/2016   TRIG 63 01/20/2016   CHOLHDL 3.0 09/29/2015     Review of Systems  Constitutional: Negative for appetite change, chills, diaphoresis,  fatigue and unexpected weight change.  HENT: Negative for hearing loss, tinnitus, trouble swallowing and voice change.   Eyes: Negative for visual disturbance.  Respiratory: Negative for choking, shortness of breath and wheezing.   Cardiovascular: Negative for chest pain, palpitations and leg swelling.  Gastrointestinal: Negative for abdominal distention, abdominal pain, blood in stool, constipation and diarrhea.  Genitourinary: Positive for frequency. Negative for difficulty urinating and dysuria.  Musculoskeletal: Positive for arthralgias. Negative for back pain and myalgias.  Skin: Negative for color change and rash.  Allergic/Immunologic: Negative for environmental allergies and food allergies.  Neurological: Negative for dizziness, syncope and headaches.  Hematological: Negative for adenopathy.  Psychiatric/Behavioral: Negative for dysphoric mood and sleep disturbance. The patient is not nervous/anxious.     Patient Active Problem List   Diagnosis Date Noted  . 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 Renal cyst, right 10/29/2016  . Pulmonary cavitary lesion 10/16/2016  . 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 Degenerative disc disease, lumbar 09/29/2015  . Primary osteoarthritis of right knee 09/29/2015  . Irritable colon 03/12/2015  . Controlled type 2 diabetes mellitus without complication (HCC) 03/12/2015  . Dyslipidemia 03/12/2015  . Enlarged prostate 03/12/2015  . Failure of erection 03/12/2015  . Seborrhea capitis 03/12/2015  . Gastroduodenal ulcer 03/12/2015  . Acquired deformities of toe 03/03/2012  . Bilateral cataracts 12/10/2011    Prior to Admission medications   Medication Sig Start Date End Date Taking? Authorizing Provider  alfuzosin (UROXATRAL) 10 MG 24 hr tablet Take 10 mg by mouth daily. 06/26/16  Yes [provider]  aspirin 81 MG chewable tablet Chew by mouth daily.   Yes [provider]  celecoxib (CELEBREX) 200 MG capsule TAKE ONE  CAPSULE BY MOUTH EVERY DAY 06/07/17  Yes Reubin MilanBerglund, Ewell Benassi H, MD  cholecalciferol (VITAMIN D) 1000 units tablet Take 1,000 Units by mouth daily.   Yes [provider]  CIALIS 20 MG tablet take 1 tablet by mouth once daily if needed 06/26/16  Yes [provider]  dicyclomine (BENTYL) 10 MG capsule TAKE 1 CAPSULE BY MOUTH THREE TIMES A DAY 11/04/17  Yes Reubin MilanBerglund, Yasuo Phimmasone H, MD  empagliflozin (JARDIANCE) 10 MG TABS tablet Take by mouth. 09/27/17  Yes [provider]  glucose blood (ONETOUCH VERIO) test strip ONETOUCH VERIO (In Vitro Strip) - Historical Medication  1 daily Active   Yes [provider]  MULTIPLE VITAMINS PO Take 1 tablet by mouth daily.   Yes [provider]  omeprazole (PRILOSEC) 40 MG capsule Take 1 capsule (40 mg total) by mouth daily. 03/05/17  Yes Reubin MilanBerglund, Jessice Madill H, MD  simvastatin (ZOCOR) 40 MG tablet Take 1 tablet by mouth at bedtime.   Yes [provider]    Allergies  Allergen Reactions  . Ace Inhibitors Other (See Comments)    Sx hypotension on low dose    Past Surgical History:  Procedure Laterality Date  . COLONOSCOPY  11/2011   normal - 10 yr follow up Dr. Earlean PolkaSolik    Social History   Tobacco Use  . Smoking status: Former Smoker    Packs/day: 0.50    Years: 13.00    Pack years: 6.50    Types: Cigarettes, Pipe, Cigars    Last attempt to quit: 1980    Years since quitting: 39.5  . Smokeless tobacco: Never Used  . Tobacco comment: smoking cessation materials not required  Substance Use Topics  . Alcohol use: No    Alcohol/week: 0.0 oz  . Drug use: Not Currently     Medication list has been reviewed and updated.  Current Meds  Medication Sig  . alfuzosin (UROXATRAL) 10 MG 24 hr tablet Take 10 mg by mouth daily.  Marland Kitchen. aspirin 81 MG chewable tablet Chew by mouth daily.  . celecoxib (CELEBREX) 200 MG capsule TAKE ONE CAPSULE BY MOUTH EVERY DAY  . cholecalciferol (VITAMIN D) 1000 units tablet Take 1,000 Units  by mouth daily.  Marland Kitchen. CIALIS 20 MG tablet take 1 tablet by mouth once daily if needed  . dicyclomine (BENTYL) 10 MG capsule TAKE 1 CAPSULE BY MOUTH THREE TIMES A DAY  . empagliflozin (JARDIANCE) 10 MG TABS tablet Take by mouth.  Marland Kitchen. glucose blood (ONETOUCH VERIO) test strip ONETOUCH VERIO (In Vitro Strip) - Historical Medication  1 daily Active  . MULTIPLE VITAMINS PO Take 1 tablet by mouth daily.  Marland Kitchen. omeprazole (PRILOSEC) 40 MG capsule Take 1 capsule (40 mg total) by mouth daily.  . simvastatin (ZOCOR) 40 MG tablet Take 1 tablet by mouth at bedtime.  . sitaGLIPtin-metformin (JANUMET) 50-1000 MG tablet Take 1 tablet by mouth 2 (two) times daily with a meal.    PHQ 2/9 Scores 10/16/2017 10/16/2017 10/15/2016 09/29/2015  PHQ - 2 Score 0 0 0 0  PHQ- 9 Score 0 - - -    Physical Exam  Constitutional: He is oriented to person, place, and time. He appears well-developed and well-nourished.  HENT:  Head: Normocephalic.  Right Ear: Tympanic membrane, external ear and ear canal normal.  Left Ear: Tympanic membrane, external ear and ear canal normal.  Nose: Nose normal.  Mouth/Throat: Uvula is midline and oropharynx is clear and moist.  Eyes: Pupils are equal, round, and reactive to light. Conjunctivae and EOM are normal.  Neck: Normal range of motion. Neck supple. Carotid bruit is not present. No thyromegaly present.  Cardiovascular: Normal rate, regular rhythm, normal heart sounds and intact distal pulses.  Pulmonary/Chest: Effort normal and breath sounds normal. He has no wheezes. Right breast exhibits no mass. Left breast exhibits no mass.  Abdominal: Soft. Normal appearance and bowel sounds are normal. There is no hepatosplenomegaly. There is no tenderness.  Musculoskeletal: Normal range of motion.  Lymphadenopathy:    He has no cervical adenopathy.  Neurological: He is alert and oriented to person, place, and time. He has normal reflexes.  Skin: Skin is warm, dry and intact.  Psychiatric: He has a  normal mood and affect. His speech is normal and behavior is normal. Judgment and thought content normal.  Nursing note and vitals reviewed.   BP 128/74   Pulse 89   Ht 5\' 10"  (1.778 m)   Wt 190 lb 3.2 oz (86.3 kg)   SpO2 99%   BMI 27.29 kg/m   Assessment and Plan: 1. Controlled type 2 diabetes mellitus without complication, without long-term current use of insulin (HCC) Improved, followed by Endocrinology Now off ACE due to low BP - will check microalbumin - Microalbumin / creatinine urine ratio  2. Dyslipidemia On statin therapy - Lipid panel  3. Enlarged prostate Followed by Urology - pt states seen with PSA in the past few months  4. Primary osteoarthritis of right knee Stable, continue nsaids prn  5. Irritable bowel syndrome without diarrhea Continue Bentyl as needed   No orders of the defined types were placed in this encounter.   Partially dictated using Animal nutritionist. Any errors are unintentional.  Bari Edward, MD [facility]  11/07/2017   There are no diagnoses linked to this encounter.

## 2017-11-08 LAB — LIPID PANEL
CHOLESTEROL TOTAL: 177 mg/dL (ref 100–199)
Chol/HDL Ratio: 3.3 ratio (ref 0.0–5.0)
HDL: 54 mg/dL (ref >39)
LDL CALC: 109 mg/dL — AB (ref 0–99)
TRIGLYCERIDES: 72 mg/dL (ref 0–149)
VLDL Cholesterol Cal: 14 mg/dL (ref 5–40)

## 2017-11-11 ENCOUNTER — Other Ambulatory Visit: Payer: Self-pay | Admitting: Internal Medicine

## 2017-11-11 MED ORDER — ROSUVASTATIN CALCIUM 40 MG PO TABS: 40.0000 mg | ORAL_TABLET | Freq: Every day | ORAL | 1 refills | Status: DC

## 2017-11-11 NOTE — Progress Notes (Signed)
Patient informed of labs. Will take different medication for cholesterol. [facility] [place].  Thank you.

## 2017-12-03 ENCOUNTER — Other Ambulatory Visit: Payer: Self-pay | Admitting: Internal Medicine

## 2017-12-19 ENCOUNTER — Ambulatory Visit
Admission: RE | Admit: 2017-12-19 | Discharge: 2017-12-19 | Disposition: A | Discharge status: Home / Self Care | Payer: Medicare Other | Source: Ambulatory Visit | Attending: Internal Medicine | Admitting: Internal Medicine

## 2017-12-19 ENCOUNTER — Encounter: Payer: Self-pay | Admitting: Internal Medicine

## 2017-12-19 ENCOUNTER — Ambulatory Visit (INDEPENDENT_AMBULATORY_CARE_PROVIDER_SITE_OTHER): Payer: Medicare Other | Admitting: Internal Medicine

## 2017-12-19 VITALS — BP 106/78 | HR 94 | Temp 98.1°F | Ht 70.0 in | Wt 188.0 lb

## 2017-12-19 DIAGNOSIS — M5136 Other intervertebral disc degeneration, lumbar region: Secondary | ICD-10-CM

## 2017-12-19 DIAGNOSIS — M545 Low back pain: Secondary | ICD-10-CM | POA: Diagnosis not present

## 2017-12-19 DIAGNOSIS — J069 Acute upper respiratory infection, unspecified: Secondary | ICD-10-CM

## 2017-12-19 DIAGNOSIS — I10 Essential (primary) hypertension: Secondary | ICD-10-CM

## 2017-12-19 DIAGNOSIS — Z23 Encounter for immunization: Secondary | ICD-10-CM

## 2017-12-19 MED ORDER — RAMIPRIL 2.5 MG PO CAPS: 2.5000 mg | ORAL_CAPSULE | Freq: Every day | ORAL | 3 refills | Status: DC

## 2017-12-19 NOTE — Progress Notes (Signed)
Date:  12/19/2017   Name:  Marc Bond   DOB:  1945-08-09   MRN:  [id number]   Chief Complaint: Back Pain (Started off and on for 2-3 months. Not sure how you injured it. Hurts across the lower back. ) and Cough (X 2 days. Coughing at night. Nose running. No fever or chills. No headaches. )  Back Pain  This is a new problem. The current episode started more than 1 month ago. The problem occurs daily. The problem has been waxing and waning since onset. The pain is present in the lumbar spine. The pain is mild. Pertinent negatives include no chest pain, fever, headaches, numbness or weakness.  Cough  This is a new problem. The current episode started yesterday. The problem has been unchanged. The cough is non-productive. Associated symptoms include nasal congestion and postnasal drip. Pertinent negatives include no chest pain, chills, ear pain, fever, headaches, myalgias, sore throat, shortness of breath or wheezing. The symptoms are aggravated by lying down.     Review of Systems  Constitutional: Negative for chills, fatigue and fever.  HENT: Positive for postnasal drip. Negative for congestion, dental problem, ear pain, sore throat and trouble swallowing.   Eyes: Negative for visual disturbance.  Respiratory: Positive for cough. Negative for chest tightness, shortness of breath and wheezing.   Cardiovascular: Negative for chest pain, palpitations and leg swelling.  Genitourinary: Negative for difficulty urinating.  Musculoskeletal: Positive for back pain. Negative for myalgias.  Neurological: Negative for tremors, weakness, numbness and headaches.  Hematological: Negative for adenopathy.    Patient Active Problem List   Diagnosis Date Noted  . 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 Renal cyst, right 10/29/2016  . Pulmonary cavitary lesion 10/16/2016  . 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 Degenerative disc disease, lumbar 09/29/2015  . Primary osteoarthritis of right knee  09/29/2015  . Irritable colon 03/12/2015  . Controlled type 2 diabetes mellitus without complication (HCC) 03/12/2015  . Dyslipidemia 03/12/2015  . Enlarged prostate 03/12/2015  . Failure of erection 03/12/2015  . Seborrhea capitis 03/12/2015  . Gastroduodenal ulcer 03/12/2015  . Acquired deformities of toe 03/03/2012  . Bilateral cataracts 12/10/2011    Allergies  Allergen Reactions  . Ace Inhibitors Other (See Comments)    Sx hypotension on low dose    Past Surgical History:  Procedure Laterality Date  . COLONOSCOPY  11/2011   normal - 10 yr follow up Dr. Earlean Polka    Social History   Tobacco Use  . Smoking status: Former Smoker    Packs/day: 0.50    Years: 13.00    Pack years: 6.50    Types: Cigarettes, Pipe, Cigars    Last attempt to quit: 1980    Years since quitting: 39.7  . Smokeless tobacco: Never Used  . Tobacco comment: smoking cessation materials not required  Substance Use Topics  . Alcohol use: No    Alcohol/week: 0.0 standard drinks  . Drug use: Not Currently     Medication list has been reviewed and updated.  Current Meds  Medication Sig  . alfuzosin (UROXATRAL) 10 MG 24 hr tablet Take 10 mg by mouth daily.  Marland Kitchen aspirin 81 MG chewable tablet Chew by mouth daily.  . budesonide (PULMICORT) 180 MCG/ACT inhaler Inhale 1 puff into the lungs 2 (two) times daily.  . celecoxib (CELEBREX) 200 MG capsule TAKE ONE CAPSULE BY MOUTH EVERY DAY  . cholecalciferol (VITAMIN D) 1000 units tablet Take 1,000 Units by mouth daily.  Marland Kitchen  CIALIS 20 MG tablet take 1 tablet by mouth once daily if needed  . dicyclomine (BENTYL) 10 MG capsule TAKE 1 CAPSULE BY MOUTH THREE TIMES A DAY  . empagliflozin (JARDIANCE) 10 MG TABS tablet Take by mouth.  Marland Kitchen glucose blood (ONETOUCH VERIO) test strip ONETOUCH VERIO (In Vitro Strip) - Historical Medication  1 daily Active  . MULTIPLE VITAMINS PO Take 1 tablet by mouth daily.  Marland Kitchen omeprazole (PRILOSEC) 40 MG capsule TAKE 1 CAPSULE BY MOUTH  EVERY DAY  . rosuvastatin (CRESTOR) 40 MG tablet Take 1 tablet (40 mg total) by mouth daily.  . sitaGLIPtin-metformin (JANUMET) 50-1000 MG tablet Take 1 tablet by mouth 2 (two) times daily with a meal.    PHQ 2/9 Scores 10/16/2017 10/16/2017 10/15/2016 09/29/2015  PHQ - 2 Score 0 0 0 0  PHQ- 9 Score 0 - - -    Physical Exam  Constitutional: He is oriented to person, place, and time. He appears well-developed. No distress.  HENT:  Head: Normocephalic and atraumatic.  Right Ear: Tympanic membrane and ear canal normal.  Left Ear: Tympanic membrane and ear canal normal.  Nose: Right sinus exhibits no maxillary sinus tenderness and no frontal sinus tenderness. Left sinus exhibits no maxillary sinus tenderness and no frontal sinus tenderness.  Mouth/Throat: Oropharynx is clear and moist.  Neck: Normal range of motion. Neck supple.  Cardiovascular: Normal rate, regular rhythm and normal heart sounds.  Pulmonary/Chest: Effort normal and breath sounds normal. No respiratory distress. He has no wheezes. He has no rales.  Musculoskeletal: Normal range of motion.       Lumbar back: He exhibits spasm. He exhibits normal range of motion and no tenderness.  Neurological: He is alert and oriented to person, place, and time. He has normal strength and normal reflexes. No sensory deficit.  SLR is negative  Skin: Skin is warm and dry. No rash noted.  Psychiatric: He has a normal mood and affect. His behavior is normal. Thought content normal.  Nursing note and vitals reviewed.   BP 106/78 (BP Location: Left Arm, Patient Position: Sitting, Cuff Size: Normal)   Pulse 94   Temp 98.1 F (36.7 C) (Oral)   Ht 5\' 10"  (1.778 m)   Wt 188 lb (85.3 kg)   SpO2 99%   BMI 26.98 kg/m   Assessment and Plan: 1. Degenerative disc disease, lumbar Recommend heat/ice, topical rubs, tylenol - all as needed - DG Lumbar Spine Complete; Future  2. Viral URI Versus allergies Recommend Claritin daily as needed  3.  Essential hypertension controlled - ramipril (ALTACE) 2.5 MG capsule; Take 1 capsule (2.5 mg total) by mouth daily.  Dispense: 90 capsule; Refill: 3  4. Need for influenza vaccination - Flu vaccine HIGH DOSE PF   Meds ordered this encounter  Medications  . ramipril (ALTACE) 2.5 MG capsule    Sig: Take 1 capsule (2.5 mg total) by mouth daily.    Dispense:  90 capsule    Refill:  3    Partially dictated using Animal nutritionist. Any errors are unintentional.  Bari Edward, MD [facility]  12/19/2017

## 2017-12-19 NOTE — Patient Instructions (Addendum)
Claritin 10 mg daily for runny nose and post nasal drip  Use ice or heat on lower back as needed Can also use BenGay or Icyhot and tylenol

## 2018-01-30 DIAGNOSIS — R7989 Other specified abnormal findings of blood chemistry: Secondary | ICD-10-CM | POA: Diagnosis not present

## 2018-01-30 DIAGNOSIS — E1169 Type 2 diabetes mellitus with other specified complication: Secondary | ICD-10-CM | POA: Diagnosis not present

## 2018-01-30 DIAGNOSIS — E785 Hyperlipidemia, unspecified: Secondary | ICD-10-CM | POA: Diagnosis not present

## 2018-01-30 LAB — HEMOGLOBIN A1C: HEMOGLOBIN A1C: 6.5

## 2018-02-05 DIAGNOSIS — N529 Male erectile dysfunction, unspecified: Secondary | ICD-10-CM | POA: Diagnosis not present

## 2018-02-05 DIAGNOSIS — N400 Enlarged prostate without lower urinary tract symptoms: Secondary | ICD-10-CM | POA: Diagnosis not present

## 2018-02-18 DIAGNOSIS — H31091 Other chorioretinal scars, right eye: Secondary | ICD-10-CM | POA: Diagnosis not present

## 2018-02-18 DIAGNOSIS — E119 Type 2 diabetes mellitus without complications: Secondary | ICD-10-CM | POA: Diagnosis not present

## 2018-02-18 DIAGNOSIS — H2513 Age-related nuclear cataract, bilateral: Secondary | ICD-10-CM | POA: Diagnosis not present

## 2018-03-01 ENCOUNTER — Other Ambulatory Visit: Payer: Self-pay | Admitting: Internal Medicine

## 2018-04-11 ENCOUNTER — Ambulatory Visit (INDEPENDENT_AMBULATORY_CARE_PROVIDER_SITE_OTHER): Payer: Medicare Other | Admitting: Internal Medicine

## 2018-04-11 ENCOUNTER — Encounter: Payer: Self-pay | Admitting: Internal Medicine

## 2018-04-11 VITALS — BP 98/60 | HR 86 | Temp 97.4°F | Ht 70.0 in | Wt 186.0 lb

## 2018-04-11 DIAGNOSIS — J0100 Acute maxillary sinusitis, unspecified: Secondary | ICD-10-CM

## 2018-04-11 MED ORDER — DOXYCYCLINE HYCLATE 100 MG PO TABS: 100.0000 mg | ORAL_TABLET | Freq: Two times a day (BID) | ORAL | 0 refills | Status: AC

## 2018-04-11 NOTE — Progress Notes (Signed)
Date:  04/11/2018   Name:  Marc MerlesHarry Bond   DOB:  1945-05-24   MRN:  [id number]   Chief Complaint: Sinusitis (Sinus pressure with green production. Started X 2 weeks. Right ear pain started yesterday. )  Sinusitis  This is a new problem. The current episode started in the past 7 days. The problem has been gradually worsening since onset. There has been no fever. The pain is mild. Associated symptoms include congestion, coughing, ear pain, sinus pressure and a sore throat. Pertinent negatives include no chills or shortness of breath.    Review of Systems  Constitutional: Negative for chills, fatigue and fever.  HENT: Positive for congestion, ear pain, sinus pressure and sore throat.   Respiratory: Positive for cough. Negative for shortness of breath and wheezing.   Cardiovascular: Negative for chest pain, palpitations and leg swelling.  Gastrointestinal: Negative for abdominal pain.  Psychiatric/Behavioral: Negative for dysphoric mood and sleep disturbance.    Patient Active Problem List   Diagnosis Date Noted  . 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 Renal cyst, right 10/29/2016  . Pulmonary cavitary lesion 10/16/2016  . 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 Degenerative disc disease, lumbar 09/29/2015  . Primary osteoarthritis of right knee 09/29/2015  . Irritable colon 03/12/2015  . Controlled type 2 diabetes mellitus without complication (HCC) 03/12/2015  . Dyslipidemia 03/12/2015  . Enlarged prostate 03/12/2015  . Failure of erection 03/12/2015  . Seborrhea capitis 03/12/2015  . Gastroduodenal ulcer 03/12/2015  . Acquired deformities of toe 03/03/2012  . Bilateral cataracts 12/10/2011    Allergies  Allergen Reactions  . Ace Inhibitors Other (See Comments)    Sx hypotension on low dose    Past Surgical History:  Procedure Laterality Date  . COLONOSCOPY  11/2011   normal - 10 yr follow up Dr. Earlean PolkaSolik    Social History   Tobacco Use  . Smoking status: Former  Smoker    Packs/day: 0.50    Years: 13.00    Pack years: 6.50    Types: Cigarettes, Pipe, Cigars    Last attempt to quit: 1980    Years since quitting: 40.0  . Smokeless tobacco: Never Used  . Tobacco comment: smoking cessation materials not required  Substance Use Topics  . Alcohol use: No    Alcohol/week: 0.0 standard drinks  . Drug use: Not Currently     Medication list has been reviewed and updated.  Current Meds  Medication Sig  . alfuzosin (UROXATRAL) 10 MG 24 hr tablet Take 10 mg by mouth daily.  Marland Kitchen. aspirin 81 MG chewable tablet Chew by mouth daily.  . budesonide (PULMICORT) 180 MCG/ACT inhaler Inhale 1 puff into the lungs 2 (two) times daily.  . celecoxib (CELEBREX) 200 MG capsule TAKE ONE CAPSULE BY MOUTH EVERY DAY  . cholecalciferol (VITAMIN D) 1000 units tablet Take 1,000 Units by mouth daily.  Marland Kitchen. CIALIS 20 MG tablet take 1 tablet by mouth once daily if needed  . dicyclomine (BENTYL) 10 MG capsule TAKE 1 CAPSULE BY MOUTH THREE TIMES A DAY  . empagliflozin (JARDIANCE) 10 MG TABS tablet Take by mouth.  Marland Kitchen. glucose blood (ONETOUCH VERIO) test strip ONETOUCH VERIO (In Vitro Strip) - Historical Medication  1 daily Active  . MULTIPLE VITAMINS PO Take 1 tablet by mouth daily.  Marland Kitchen. omeprazole (PRILOSEC) 40 MG capsule TAKE 1 CAPSULE BY MOUTH EVERY DAY  . ramipril (ALTACE) 2.5 MG capsule Take 1 capsule (2.5 mg total) by mouth daily.  . rosuvastatin (CRESTOR)  40 MG tablet Take 1 tablet (40 mg total) by mouth daily.  . sitaGLIPtin-metformin (JANUMET) 50-1000 MG tablet Take 1 tablet by mouth 2 (two) times daily with a meal.    PHQ 2/9 Scores 10/16/2017 10/16/2017 10/15/2016 09/29/2015  PHQ - 2 Score 0 0 0 0  PHQ- 9 Score 0 - - -    Physical Exam Constitutional:      Appearance: He is well-developed.  HENT:     Right Ear: Ear canal and external ear normal. Tympanic membrane is erythematous. Tympanic membrane is not retracted.     Left Ear: Ear canal and external ear normal. Tympanic  membrane is not erythematous or retracted.     Nose:     Right Sinus: Maxillary sinus tenderness and frontal sinus tenderness present.     Left Sinus: Maxillary sinus tenderness and frontal sinus tenderness present.     Mouth/Throat:     Mouth: No oral lesions.     Pharynx: Uvula midline. Posterior oropharyngeal erythema present. No oropharyngeal exudate.  Cardiovascular:     Rate and Rhythm: Normal rate and regular rhythm.     Heart sounds: Normal heart sounds.  Pulmonary:     Breath sounds: Normal breath sounds. No wheezing or rales.  Lymphadenopathy:     Cervical: No cervical adenopathy.  Neurological:     Mental Status: He is alert and oriented to person, place, and time.     BP 98/60 (BP Location: Right Arm, Patient Position: Sitting, Cuff Size: Large)   Pulse 86   Temp (!) 97.4 F (36.3 C) (Oral)   Ht 5\' 10"  (1.778 m)   Wt 186 lb (84.4 kg)   SpO2 100%   BMI 26.69 kg/m   Assessment and Plan: 1. Acute non-recurrent maxillary sinusitis Begin flonase spray Take tylenol or advil as needed Increase fluids - doxycycline (VIBRA-TABS) 100 MG tablet; Take 1 tablet (100 mg total) by mouth 2 (two) times daily for 10 days.  Dispense: 20 tablet; Refill: 0   Partially dictated using Animal nutritionistDragon software. Any errors are unintentional.  Bari EdwardLaura Jonathon Tan, MD [facility]  04/11/2018

## 2018-04-11 NOTE — Patient Instructions (Signed)
Flonase nasal spray for 1-2 weeks  Tylenol or Advil as needed for headache

## 2018-04-14 ENCOUNTER — Telehealth: Payer: Self-pay

## 2018-04-14 NOTE — Telephone Encounter (Signed)
Patient girlfriend Marc Bond calling saying the patient has developed a cough since appt Friday. He is taking doxycycline and Flonase but wants to know if anything can be called in for cough.  Please Advise.

## 2018-04-14 NOTE — Telephone Encounter (Signed)
I recommend Delsym over the counter.

## 2018-04-14 NOTE — Telephone Encounter (Signed)
Patient girlfriend Suzzette Righteramela Bennett informed to tell patient to try Delsym OTC for cough. She said she will let him know.

## 2018-04-28 ENCOUNTER — Telehealth: Payer: Self-pay

## 2018-04-28 ENCOUNTER — Other Ambulatory Visit: Payer: Self-pay | Admitting: Internal Medicine

## 2018-04-28 DIAGNOSIS — Z20828 Contact with and (suspected) exposure to other viral communicable diseases: Secondary | ICD-10-CM

## 2018-04-28 MED ORDER — OSELTAMIVIR PHOSPHATE 75 MG PO CAPS: 75.0000 mg | ORAL_CAPSULE | Freq: Every day | ORAL | 0 refills | Status: AC

## 2018-04-28 NOTE — Telephone Encounter (Signed)
Grandson dx with flu and sleeps in bed with patient. Pediatric office recommends Tamiflu. Advised we will send in Rx to [facility] and to check with [facility].

## 2018-04-30 ENCOUNTER — Ambulatory Visit (INDEPENDENT_AMBULATORY_CARE_PROVIDER_SITE_OTHER): Payer: Medicare Other | Admitting: Internal Medicine

## 2018-04-30 ENCOUNTER — Encounter: Payer: Self-pay | Admitting: Internal Medicine

## 2018-04-30 VITALS — BP 118/70 | HR 73 | Temp 97.6°F | Ht 70.0 in | Wt 184.0 lb

## 2018-04-30 DIAGNOSIS — E785 Hyperlipidemia, unspecified: Secondary | ICD-10-CM

## 2018-04-30 DIAGNOSIS — I10 Essential (primary) hypertension: Secondary | ICD-10-CM

## 2018-04-30 DIAGNOSIS — E119 Type 2 diabetes mellitus without complications: Secondary | ICD-10-CM

## 2018-04-30 MED ORDER — ROSUVASTATIN CALCIUM 40 MG PO TABS: 40.0000 mg | ORAL_TABLET | Freq: Every day | ORAL | 1 refills | Status: DC

## 2018-04-30 MED ORDER — CELECOXIB 200 MG PO CAPS: 200.0000 mg | ORAL_CAPSULE | Freq: Every day | ORAL | 1 refills | Status: DC

## 2018-04-30 MED ORDER — RAMIPRIL 2.5 MG PO CAPS: 2.5000 mg | ORAL_CAPSULE | Freq: Every day | ORAL | 3 refills | Status: DC

## 2018-04-30 NOTE — Progress Notes (Signed)
Date:  04/30/2018   Name:  Marc Bond   DOB:  05-Jan-1946   MRN:  [id number]   Chief Complaint: Hyperlipidemia (Check Cholesterol for care gaps. )  Diabetes  He presents for his follow-up diabetic visit. He has type 2 diabetes mellitus. His disease course has been improving. Pertinent negatives for hypoglycemia include no headaches or tremors. Pertinent negatives for diabetes include no chest pain and no fatigue. Current diabetic treatment includes oral agent (dual therapy). He is compliant with treatment all of the time. An ACE inhibitor/angiotensin II receptor blocker is being taken (BP goes too low and pt is sx). Eye exam is current.  Hyperlipidemia  This is a chronic problem. Pertinent negatives include no chest pain or shortness of breath. Current antihyperlipidemic treatment includes statins (therapy changed to Crestor).  Hypertension  This is a chronic problem. The problem is controlled. Pertinent negatives include no chest pain, headaches, palpitations or shortness of breath. Past treatments include ACE inhibitors.  Exposure to influenza - his grandson was diagnosed earlier this week and he started Tamiflu for prevention.  He feels well, no sx.  Lab Results  Component Value Date   HGBA1C 6.5 01/30/2018   Lab Results  Component Value Date   CHOL 177 11/07/2017   HDL 54 11/07/2017   LDLCALC 109 (H) 11/07/2017   TRIG 72 11/07/2017   CHOLHDL 3.3 11/07/2017     Review of Systems  Constitutional: Negative for appetite change, fatigue and unexpected weight change.  Eyes: Negative for visual disturbance.  Respiratory: Negative for cough, shortness of breath and wheezing.   Cardiovascular: Negative for chest pain, palpitations and leg swelling.  Gastrointestinal: Negative for abdominal pain and blood in stool.  Genitourinary: Positive for frequency. Negative for dysuria and hematuria.  Skin: Negative for color change and rash.  Neurological: Negative for tremors, numbness and  headaches.  Psychiatric/Behavioral: Negative for dysphoric mood.    Patient Active Problem List   Diagnosis Date Noted  . 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 Renal cyst, right 10/29/2016  . Pulmonary cavitary lesion 10/16/2016  . 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 Degenerative disc disease, lumbar 09/29/2015  . Primary osteoarthritis of right knee 09/29/2015  . Irritable colon 03/12/2015  . Controlled type 2 diabetes mellitus without complication (HCC) 03/12/2015  . Dyslipidemia 03/12/2015  . Enlarged prostate 03/12/2015  . Failure of erection 03/12/2015  . Seborrhea capitis 03/12/2015  . Gastroduodenal ulcer 03/12/2015  . Acquired deformities of toe 03/03/2012  . Bilateral cataracts 12/10/2011    Allergies  Allergen Reactions  . Ace Inhibitors Other (See Comments)    Sx hypotension on low dose    Past Surgical History:  Procedure Laterality Date  . COLONOSCOPY  11/2011   normal - 10 yr follow up Dr. Earlean Polka    Social History   Tobacco Use  . Smoking status: Former Smoker    Packs/day: 0.50    Years: 13.00    Pack years: 6.50    Types: Cigarettes, Pipe, Cigars    Last attempt to quit: 1980    Years since quitting: 40.0  . Smokeless tobacco: Never Used  . Tobacco comment: smoking cessation materials not required  Substance Use Topics  . Alcohol use: No    Alcohol/week: 0.0 standard drinks  . Drug use: Not Currently     Medication list has been reviewed and updated.  No outpatient medications have been marked as taking for the 04/30/18 encounter (Office Visit) with Reubin Milan, MD.  PHQ 2/9 Scores 10/16/2017 10/16/2017 10/15/2016 09/29/2015  PHQ - 2 Score 0 0 0 0  PHQ- 9 Score 0 - - -    Physical Exam Vitals signs and nursing note reviewed.  Constitutional:      General: He is not in acute distress.    Appearance: Normal appearance. He is well-developed.  HENT:     Head: Normocephalic and atraumatic.     Right Ear: Tympanic membrane, ear  canal and external ear normal.     Left Ear: Tympanic membrane, ear canal and external ear normal.     Nose: Nose normal.     Mouth/Throat:     Pharynx: Uvula midline.  Eyes:     Conjunctiva/sclera: Conjunctivae normal.     Pupils: Pupils are equal, round, and reactive to light.  Neck:     Musculoskeletal: Normal range of motion and neck supple.     Thyroid: No thyromegaly.     Vascular: No carotid bruit.  Cardiovascular:     Rate and Rhythm: Normal rate and regular rhythm.     Heart sounds: Normal heart sounds.  Pulmonary:     Effort: Pulmonary effort is normal. No respiratory distress.     Breath sounds: Normal breath sounds. No wheezing.  Chest:     Breasts:        Right: No mass.        Left: No mass.  Abdominal:     General: Bowel sounds are normal.     Palpations: Abdomen is soft.     Tenderness: There is no abdominal tenderness.  Musculoskeletal: Normal range of motion.     Right lower leg: No edema.     Left lower leg: No edema.  Lymphadenopathy:     Cervical: No cervical adenopathy.  Skin:    General: Skin is warm and dry.     Findings: No rash.  Neurological:     Mental Status: He is alert and oriented to person, place, and time.     Deep Tendon Reflexes: Reflexes are normal and symmetric.  Psychiatric:        Speech: Speech normal.        Behavior: Behavior normal.        Thought Content: Thought content normal.        Judgment: Judgment normal.    Wt Readings from Last 3 Encounters:  04/30/18 184 lb (83.5 kg)  04/11/18 186 lb (84.4 kg)  12/19/17 188 lb (85.3 kg)    BP 118/70 (BP Location: Right Arm, Patient Position: Sitting, Cuff Size: Normal)   Pulse 73   Temp 97.6 F (36.4 C) (Oral)   Ht 5\' 10"  (1.778 m)   Wt 184 lb (83.5 kg)   SpO2 99%   BMI 26.40 kg/m   Assessment and Plan: 1. Dyslipidemia Now on Crestor without side effects - Lipid panel  2. Controlled type 2 diabetes mellitus without complication, without long-term current use of  insulin (HCC) Check labs Follow up with Endo - Comprehensive metabolic panel - Hemoglobin A1c  3. Essential hypertension controlled - ramipril (ALTACE) 2.5 MG capsule; Take 1 capsule (2.5 mg total) by mouth daily.  Dispense: 90 capsule; Refill: 3   Partially dictated using Animal nutritionist. Any errors are unintentional.  Bari Edward, MD [facility]  04/30/2018

## 2018-05-01 LAB — COMPREHENSIVE METABOLIC PANEL
ALBUMIN: 4.4 g/dL (ref 3.5–4.8)
ALK PHOS: 60 IU/L (ref 39–117)
ALT: 17 IU/L (ref 0–44)
AST: 22 IU/L (ref 0–40)
Albumin/Globulin Ratio: 1.3 (ref 1.2–2.2)
BUN / CREAT RATIO: 16 (ref 10–24)
BUN: 21 mg/dL (ref 8–27)
Bilirubin Total: 0.5 mg/dL (ref 0.0–1.2)
CO2: 19 mmol/L — ABNORMAL LOW (ref 20–29)
CREATININE: 1.28 mg/dL — AB (ref 0.76–1.27)
Calcium: 10.2 mg/dL (ref 8.6–10.2)
Chloride: 103 mmol/L (ref 96–106)
GFR calc Af Amer: 64 mL/min/{1.73_m2} (ref >59)
GFR calc non Af Amer: 55 mL/min/{1.73_m2} — ABNORMAL LOW (ref >59)
GLUCOSE: 127 mg/dL — AB (ref 65–99)
Globulin, Total: 3.5 g/dL (ref 1.5–4.5)
Potassium: 4.9 mmol/L (ref 3.5–5.2)
Sodium: 138 mmol/L (ref 134–144)
Total Protein: 7.9 g/dL (ref 6.0–8.5)

## 2018-05-01 LAB — LIPID PANEL
CHOL/HDL RATIO: 2.5 ratio (ref 0.0–5.0)
Cholesterol, Total: 127 mg/dL (ref 100–199)
HDL: 50 mg/dL (ref >39)
LDL Calculated: 60 mg/dL (ref 0–99)
Triglycerides: 84 mg/dL (ref 0–149)
VLDL Cholesterol Cal: 17 mg/dL (ref 5–40)

## 2018-05-01 LAB — HEMOGLOBIN A1C
Est. average glucose Bld gHb Est-mCnc: 160 mg/dL
Hgb A1c MFr Bld: 7.2 % — ABNORMAL HIGH (ref 4.8–5.6)

## 2018-05-12 DIAGNOSIS — E119 Type 2 diabetes mellitus without complications: Secondary | ICD-10-CM | POA: Diagnosis not present

## 2018-05-12 DIAGNOSIS — E1169 Type 2 diabetes mellitus with other specified complication: Secondary | ICD-10-CM | POA: Diagnosis not present

## 2018-05-12 DIAGNOSIS — Z7984 Long term (current) use of oral hypoglycemic drugs: Secondary | ICD-10-CM | POA: Diagnosis not present

## 2018-05-12 DIAGNOSIS — Z87891 Personal history of nicotine dependence: Secondary | ICD-10-CM | POA: Diagnosis not present

## 2018-05-15 ENCOUNTER — Other Ambulatory Visit: Payer: Self-pay | Admitting: Internal Medicine

## 2018-05-16 DIAGNOSIS — R918 Other nonspecific abnormal finding of lung field: Secondary | ICD-10-CM | POA: Diagnosis not present

## 2018-05-16 DIAGNOSIS — J439 Emphysema, unspecified: Secondary | ICD-10-CM | POA: Diagnosis not present

## 2018-05-16 DIAGNOSIS — R05 Cough: Secondary | ICD-10-CM | POA: Diagnosis not present

## 2018-05-29 ENCOUNTER — Other Ambulatory Visit: Payer: Self-pay | Admitting: Internal Medicine

## 2018-05-30 ENCOUNTER — Other Ambulatory Visit: Payer: Self-pay | Admitting: Internal Medicine

## 2018-06-13 DIAGNOSIS — R918 Other nonspecific abnormal finding of lung field: Secondary | ICD-10-CM | POA: Diagnosis not present

## 2018-06-13 DIAGNOSIS — J439 Emphysema, unspecified: Secondary | ICD-10-CM | POA: Diagnosis not present

## 2018-08-11 DIAGNOSIS — E119 Type 2 diabetes mellitus without complications: Secondary | ICD-10-CM | POA: Diagnosis not present

## 2018-08-12 ENCOUNTER — Telehealth: Payer: Self-pay

## 2018-08-12 NOTE — Telephone Encounter (Signed)
LVM informing patient to cb if questions but should be taking cholesterol meds.

## 2018-08-12 NOTE — Telephone Encounter (Signed)
Patient girlfriend called asking about cholesterol medicine but did not specify.  Called her back and she handed phone to patient. Patient said he was told to stop cholesterol medication 6 or 7 months ago so he does not take rosuvastatin anymore.  Wanted to make sure he was not confused and should or should not be taking.  Please advise. Last Lipid was good.

## 2018-08-12 NOTE — Telephone Encounter (Signed)
He should absolutely be taking Crestor 40 mg per day. I did not tell him to stop it.  I refilled it at his last appointment in January.

## 2018-08-15 ENCOUNTER — Telehealth: Payer: Self-pay

## 2018-08-15 NOTE — Telephone Encounter (Signed)
Marc Bond, Patients girlfriend called saying patient has been taking simvastatin instead of rosuvastatin. Wants to know exactly what to do.  Called patient girlfriend back. Told her to throw away the simvastatin and only tell him to take the rosuvastatin because this is stronger and this is the medication he needs for his cholesterol.   She verbalized understanding.

## 2018-10-20 ENCOUNTER — Other Ambulatory Visit: Payer: Self-pay

## 2018-10-20 ENCOUNTER — Ambulatory Visit (INDEPENDENT_AMBULATORY_CARE_PROVIDER_SITE_OTHER): Payer: Medicare Other

## 2018-10-20 VITALS — BP 104/72 | HR 90 | Temp 97.9°F | Resp 16 | Ht 70.0 in | Wt 181.8 lb

## 2018-10-20 DIAGNOSIS — Z0000 Encounter for general adult medical examination without abnormal findings: Secondary | ICD-10-CM | POA: Diagnosis not present

## 2018-10-20 NOTE — Progress Notes (Signed)
Subjective:   Marc MerlesHarry Bond is a 73 y.o. male who presents for Medicare Annual/Subsequent preventive examination.  Review of Systems:   Cardiac Risk Factors include: advanced age (>3355men, 83>65 women);hypertension;diabetes mellitus;dyslipidemia;male gender     Objective:    Vitals: BP 104/72 (BP Location: Left Arm, Patient Position: Sitting, Cuff Size: Normal)   Pulse 90   Temp 97.9 F (36.6 C) (Oral)   Resp 16   Ht 5\' 10"  (1.778 m)   Wt 181 lb 12.8 oz (82.5 kg)   SpO2 96%   BMI 26.09 kg/m   Body mass index is 26.09 kg/m.  Advanced Directives 10/20/2018 10/16/2017 09/29/2015  Does Patient Have a Medical Advance Directive? Yes Yes Yes  Type of Estate agentAdvance Directive Healthcare Power of [place];Living will Healthcare Power of [place];Living will Healthcare Power of [place];Living will  Does patient want to make changes to medical advance directive? - - No - Patient declined  Copy of Healthcare Power of Attorney in Chart? No - copy requested No - copy requested -    Tobacco Social History   Tobacco Use  Smoking Status Former Smoker  . Packs/day: 0.50  . Years: 13.00  . Pack years: 6.50  . Types: Cigarettes, Pipe, Cigars  . Quit date: 351980  . Years since quitting: 40.5  Smokeless Tobacco Never Used  Tobacco Comment   smoking cessation materials not required     Counseling given: Not Answered Comment: smoking cessation materials not required   Clinical Intake:  Pre-visit preparation completed: Yes  Pain : No/denies pain     BMI - recorded: 26.09 Nutritional Status: BMI 25 -29 Overweight Nutritional Risks: None Diabetes: Yes CBG done?: No Did pt. bring in CBG monitor from home?: No   Nutrition Risk Assessment:  Has the patient had any N/V/D within the last 2 months?  No  Does the patient have any non-healing wounds?  No  Has the patient had any unintentional weight loss or weight gain?  No   Diabetes:  Is the patient diabetic?  Yes  If diabetic, was a CBG  obtained today?  No  Did the patient bring in their glucometer from home?  No  How often do you monitor your CBG's? Once daily.   Financial Strains and Diabetes Management:  Are you having any financial strains with the device, your supplies or your medication? No .  Does the patient want to be seen by Chronic Care Management for management of their diabetes?  No  Would the patient like to be referred to a Nutritionist or for Diabetic Management?  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   Diabetic Foot Exam: Completed 10/16/17. Pt has been advised about the importance in completing this exam. Pt is scheduled for diabetic foot exam on 10/27/18.   How often do you need to have someone help you when you read instructions, pamphlets, or other written materials from your doctor or pharmacy?: 1 - Never  Interpreter Needed?: No  Information entered by :: Reather LittlerKasey Chevelle Coulson LPN  Past Medical History:  Diagnosis Date  . Diabetes mellitus without complication (HCC)   . Enlarged prostate   . GERD (gastroesophageal reflux disease)   . Hyperlipidemia   . Hypertension   . IBS (irritable bowel syndrome)   . Mood disorder (HCC) 08/20/2016   Past Surgical History:  Procedure Laterality Date  . COLONOSCOPY  11/2011   normal - 10 yr follow up Dr. Earlean PolkaSolik  . PROSTATE SURGERY  Family History  Problem Relation Age of Onset  . Hypertension Sister   . Kidney disease Mother   . Hypertension Mother   . Emphysema Father   . Diabetes Brother   . Diabetes Sister   . Diabetes Brother    Social History   Socioeconomic History  . Marital status: Widowed    Spouse name: Not on file  . Number of children: 2  . Years of education: Not on file  . Highest education level: 11th grade  Occupational History  . Occupation: Retired  Engineer, productionocial Needs  . Financial resource strain: Not hard at all  . Food insecurity    Worry: Never true    Inability: Never true  .  Transportation needs    Medical: No    Non-medical: No  Tobacco Use  . Smoking status: Former Smoker    Packs/day: 0.50    Years: 13.00    Pack years: 6.50    Types: Cigarettes, Pipe, Cigars    Quit date: 1980    Years since quitting: 40.5  . Smokeless tobacco: Never Used  . Tobacco comment: smoking cessation materials not required  Substance and Sexual Activity  . Alcohol use: No    Alcohol/week: 0.0 standard drinks  . Drug use: Not Currently  . Sexual activity: Yes  Lifestyle  . Physical activity    Days per week: 7 days    Minutes per session: 40 min  . Stress: Not at all  Relationships  . Social connections    Talks on phone: More than three times a week    Gets together: Three times a week    Attends religious service: More than 4 times per year    Active member of club or organization: No    Attends meetings of clubs or organizations: Never    Relationship status: Widowed  Other Topics Concern  . Not on file  Social History Narrative  . Not on file    Outpatient Encounter Medications as of 10/20/2018  Medication Sig  . alfuzosin (UROXATRAL) 10 MG 24 hr tablet Take 10 mg by mouth daily.  Marland Kitchen. aspirin 81 MG chewable tablet Chew by mouth daily.  . celecoxib (CELEBREX) 200 MG capsule TAKE ONE CAPSULE BY MOUTH EVERY DAY  . cholecalciferol (VITAMIN D) 1000 units tablet Take 1,000 Units by mouth daily.  Marland Kitchen. CIALIS 20 MG tablet take 1 tablet by mouth once daily if needed  . empagliflozin (JARDIANCE) 10 MG TABS tablet Take by mouth.  Marland Kitchen. glucose blood (ONETOUCH VERIO) test strip ONETOUCH VERIO (In Vitro Strip) - Historical Medication  1 daily Active  . MULTIPLE VITAMINS PO Take 1 tablet by mouth daily.  Marland Kitchen. omeprazole (PRILOSEC) 40 MG capsule TAKE 1 CAPSULE BY MOUTH EVERY DAY  . ramipril (ALTACE) 2.5 MG capsule Take 1 capsule (2.5 mg total) by mouth daily.  . rosuvastatin (CRESTOR) 40 MG tablet TAKE 1 TABLET(40 MG) BY MOUTH DAILY  . sitaGLIPtin-metformin (JANUMET) 50-1000 MG  tablet Take 1 tablet by mouth 2 (two) times daily with a meal.  . [DISCONTINUED] budesonide (PULMICORT) 180 MCG/ACT inhaler Inhale 1 puff into the lungs 2 (two) times daily.  . [DISCONTINUED] dicyclomine (BENTYL) 10 MG capsule TAKE 1 CAPSULE BY MOUTH THREE TIMES A DAY   No facility-administered encounter medications on file as of 10/20/2018.     Activities of Daily Living In your present state of health, do you have any difficulty performing the following activities: 10/20/2018  Hearing? N  Comment declines hearing aids  Vision? Y  Comment positive retinopathy  Difficulty concentrating or making decisions? N  Walking or climbing stairs? N  Dressing or bathing? N  Doing errands, shopping? N  Preparing Food and eating ? N  Using the Toilet? N  In the past six months, have you accidently leaked urine? N  Do you have problems with loss of bowel control? N  Managing your Medications? N  Managing your Finances? N  Housekeeping or managing your Housekeeping? N  Some recent data might be hidden    Patient Care Team: Reubin MilanBerglund, Laura H, MD as PCP - General (Internal Medicine) Jeronimo NormaJelesoff, Nicole (Inactive) as Physician Assistant (Endocrinology) Victorino DecemberFriedman, Eugene, MD as Referring Physician (Pulmonary Disease) [facility] ([facility]) Lacey JensenSolik, Steven, MD as Consulting Physician (Gastroenterology) Desma PaganiniSmith, Sherri Lyn, AUD as Consulting Physician (Audiology)   Assessment:   This is a routine wellness examination for Marc SailsHarry.  Exercise Activities and Dietary recommendations Current Exercise Habits: Home exercise routine, Type of exercise: walking, Time (Minutes): 40, Frequency (Times/Week): 7, Weekly Exercise (Minutes/Week): 280, Intensity: Moderate, Exercise limited by: None identified  Goals    . DIET - INCREASE WATER INTAKE     Recommend to drink at least 6-8 8oz glasses of water per day.       Fall Risk Fall Risk  10/20/2018 10/16/2017 10/16/2017 10/15/2016 09/29/2015  Falls in the past year? 0 No  No No No  Number falls in past yr: 0 - - - -  Injury with Fall? 0 - - - -  Risk for fall due to : - Impaired vision;Other (Comment) - - -  Risk for fall due to: Comment - wears eyeglasses, fatigue - - -  Follow up Falls prevention discussed - - - -   FALL RISK PREVENTION PERTAINING TO THE HOME:  Any stairs in or around the home? No  If so, do they handrails? No   Home free of loose throw rugs in walkways, pet beds, electrical cords, etc? Yes  Adequate lighting in your home to reduce risk of falls? Yes   ASSISTIVE DEVICES UTILIZED TO PREVENT FALLS:  Life alert? No  Use of a cane, walker or w/c? No  Grab bars in the bathroom? Yes  Shower chair or bench in shower? Yes  Elevated toilet seat or a handicapped toilet? No   DME ORDERS:  DME order needed?  No   TIMED UP AND GO:  Was the test performed? Yes .  Length of time to ambulate 10 feet: 5 sec.   GAIT:  Appearance of gait: Gait stead-fast and without the use of an assistive device.   Education: Fall risk prevention has been discussed.  Intervention(s) required? No    Depression Screen PHQ 2/9 Scores 10/20/2018 10/16/2017 10/16/2017 10/15/2016  PHQ - 2 Score 0 0 0 0  PHQ- 9 Score 0 0 - -    Cognitive Function     6CIT Screen 10/20/2018 10/16/2017  What Year? 0 points 0 points  What month? 0 points 0 points  What time? 0 points 0 points  Count back from 20 0 points 0 points  Months in reverse 0 points 0 points  Repeat phrase 4 points 10 points  Total Score 4 10    Immunization History  Administered Date(s) Administered  . Influenza, High Dose Seasonal PF 12/19/2017  . Influenza,inj,Quad PF,6+ Mos 12/26/2016  . Influenza-Unspecified 02/16/2015, 12/26/2016  . Pneumococcal Conjugate-13 09/29/2015, 03/27/2017  . Pneumococcal Polysaccharide-23 04/17/2008, 12/04/2012  . Tdap 10/24/2017  . Zoster Recombinat (Shingrix) 01/22/2018, 03/25/2018  Qualifies for Shingles Vaccine? Yes  Shingrix series complete.  Tdap: Up to  date  Flu Vaccine: Up to date  Pneumococcal Vaccine: Up to date   Screening Tests Health Maintenance  Topic Date Due  . OPHTHALMOLOGY EXAM  08/07/2018  . FOOT EXAM  10/17/2018  . HEMOGLOBIN A1C  10/29/2018  . INFLUENZA VACCINE  11/15/2018  . COLONOSCOPY  11/25/2021  . TETANUS/TDAP  10/25/2027  . PNA vac Low Risk Adult  Completed  . Hepatitis C Screening  Addressed   Cancer Screenings:  Colorectal Screening: Completed 02/21/12. Repeat every 10 years  Lung Cancer Screening: (Low Dose CT Chest recommended if Age 25-80 years, 30 pack-year currently smoking OR have quit w/in 15years.) does not qualify.   Additional Screening:  Hepatitis C Screening: does qualify; Completed 02/21/12  Vision Screening: Recommended annual ophthalmology exams for early detection of glaucoma and other disorders of the eye. Is the patient up to date with their annual eye exam?  No  office closed, pt was due for repeat screening during pandemic, awaiting new appt.  Who is the provider or what is the name of the office in which the pt attends annual eye exams? [place]  Dental Screening: Recommended annual dental exams for proper oral hygiene  Community Resource Referral:  CRR required this visit?  No       Plan:    I have personally reviewed and addressed the Medicare Annual Wellness questionnaire and have noted the following in the patient's chart:  A. Medical and social history B. Use of alcohol, tobacco or illicit drugs  C. Current medications and supplements D. Functional ability and status E.  Nutritional status F.  Physical activity G. Advance directives H. List of other physicians I.  Hospitalizations, surgeries, and ER visits in previous 12 months J.  [place] such as hearing and vision if needed, cognitive and depression L. Referrals and appointments   In addition, I have reviewed and discussed with patient certain preventive protocols, quality metrics, and best  practice recommendations. A written personalized care plan for preventive services as well as general preventive health recommendations were provided to patient.   Signed,  Clemetine Marker, LPN Nurse Health Advisor   Nurse Notes: pt doing well and appreciative of visit today. Pt reports fasting blood sugar this morning of 182, he is scheduled for 6 month DM follow up next week.

## 2018-10-20 NOTE — Patient Instructions (Signed)
Marc Bond , Thank you for taking time to come for your Medicare Wellness Visit. I appreciate your ongoing commitment to your health goals. Please review the following plan we discussed and let me know if I can assist you in the future.   Screening recommendations/referrals: Colonoscopy: done 02/21/12. Repeat in 2023. Recommended yearly ophthalmology/optometry visit for glaucoma screening and checkup Recommended yearly dental visit for hygiene and checkup  Vaccinations: Influenza vaccine: done 12/19/17 Pneumococcal vaccine: done 1212/18 Tdap vaccine: done 10/24/17 Shingles vaccine: done 03/25/18    Advanced directives: Please bring a copy of your health care power of attorney and living will to the office at your convenience.  Conditions/risks identified: Keep up the great work!  Next appointment: Please follow up in one year for your Medicare Annual Wellness visit.    Preventive Care 74 Years and Older, Male Preventive care refers to lifestyle choices and visits with your health care provider that can promote health and wellness. What does preventive care include?  A yearly physical exam. This is also called an annual well check.  Dental exams once or twice a year.  Routine eye exams. Ask your health care provider how often you should have your eyes checked.  Personal lifestyle choices, including:  Daily care of your teeth and gums.  Regular physical activity.  Eating a healthy diet.  Avoiding tobacco and drug use.  Limiting alcohol use.  Practicing safe sex.  Taking low doses of aspirin every day.  Taking vitamin and mineral supplements as recommended by your health care provider. What happens during an annual well check? The services and screenings done by your health care provider during your annual well check will depend on your age, overall health, lifestyle risk factors, and family history of disease. Counseling  Your health care provider may ask you questions  about your:  Alcohol use.  Tobacco use.  Drug use.  Emotional well-being.  Home and relationship well-being.  Sexual activity.  Eating habits.  History of falls.  Memory and ability to understand (cognition).  Work and work Statistician. Screening  You may have the following tests or measurements:  Height, weight, and BMI.  Blood pressure.  Lipid and cholesterol levels. These may be checked every 5 years, or more frequently if you are over 66 years old.  Skin check.  Lung cancer screening. You may have this screening every year starting at age 28 if you have a 30-pack-year history of smoking and currently smoke or have quit within the past 15 years.  Fecal occult blood test (FOBT) of the stool. You may have this test every year starting at age 63.  Flexible sigmoidoscopy or colonoscopy. You may have a sigmoidoscopy every 5 years or a colonoscopy every 10 years starting at age 68.  Prostate cancer screening. Recommendations will vary depending on your family history and other risks.  Hepatitis C blood test.  Hepatitis B blood test.  Sexually transmitted disease (STD) testing.  Diabetes screening. This is done by checking your blood sugar (glucose) after you have not eaten for a while (fasting). You may have this done every 1-3 years.  Abdominal aortic aneurysm (AAA) screening. You may need this if you are a current or former smoker.  Osteoporosis. You may be screened starting at age 63 if you are at high risk. Talk with your health care provider about your test results, treatment options, and if necessary, the need for more tests. Vaccines  Your health care provider may recommend certain vaccines, such as:  Influenza vaccine. This is recommended every year.  Tetanus, diphtheria, and acellular pertussis (Tdap, Td) vaccine. You may need a Td booster every 10 years.  Zoster vaccine. You may need this after age 85.  Pneumococcal 13-valent conjugate (PCV13)  vaccine. One dose is recommended after age 5.  Pneumococcal polysaccharide (PPSV23) vaccine. One dose is recommended after age 14. Talk to your health care provider about which screenings and vaccines you need and how often you need them. This information is not intended to replace advice given to you by your health care provider. Make sure you discuss any questions you have with your health care provider. Document Released: 04/29/2015 Document Revised: 12/21/2015 Document Reviewed: 02/01/2015 Elsevier Interactive Patient Education  2017 [place] Prevention in the Home Falls can cause injuries. They can happen to people of all ages. There are many things you can do to make your home safe and to help prevent falls. What can I do on the outside of my home?  Regularly fix the edges of walkways and driveways and fix any cracks.  Remove anything that might make you trip as you walk through a door, such as a raised step or threshold.  Trim any bushes or trees on the path to your home.  Use bright outdoor lighting.  Clear any walking paths of anything that might make someone trip, such as rocks or tools.  Regularly check to see if handrails are loose or broken. Make sure that both sides of any steps have handrails.  Any raised decks and porches should have guardrails on the edges.  Have any leaves, snow, or ice cleared regularly.  Use sand or salt on walking paths during winter.  Clean up any spills in your garage right away. This includes oil or grease spills. What can I do in the bathroom?  Use night lights.  Install grab bars by the toilet and in the tub and shower. Do not use towel bars as grab bars.  Use non-skid mats or decals in the tub or shower.  If you need to sit down in the shower, use a plastic, non-slip stool.  Keep the floor dry. Clean up any water that spills on the floor as soon as it happens.  Remove soap buildup in the tub or shower regularly.   Attach bath mats securely with double-sided non-slip rug tape.  Do not have throw rugs and other things on the floor that can make you trip. What can I do in the bedroom?  Use night lights.  Make sure that you have a light by your bed that is easy to reach.  Do not use any sheets or blankets that are too big for your bed. They should not hang down onto the floor.  Have a firm chair that has side arms. You can use this for support while you get dressed.  Do not have throw rugs and other things on the floor that can make you trip. What can I do in the kitchen?  Clean up any spills right away.  Avoid walking on wet floors.  Keep items that you use a lot in easy-to-reach places.  If you need to reach something above you, use a strong step stool that has a grab bar.  Keep electrical cords out of the way.  Do not use floor polish or wax that makes floors slippery. If you must use wax, use non-skid floor wax.  Do not have throw rugs and other things on the floor that  can make you trip. What can I do with my stairs?  Do not leave any items on the stairs.  Make sure that there are handrails on both sides of the stairs and use them. Fix handrails that are broken or loose. Make sure that handrails are as long as the stairways.  Check any carpeting to make sure that it is firmly attached to the stairs. Fix any carpet that is loose or worn.  Avoid having throw rugs at the top or bottom of the stairs. If you do have throw rugs, attach them to the floor with carpet tape.  Make sure that you have a light switch at the top of the stairs and the bottom of the stairs. If you do not have them, ask someone to add them for you. What else can I do to help prevent falls?  Wear shoes that:  Do not have high heels.  Have rubber bottoms.  Are comfortable and fit you well.  Are closed at the toe. Do not wear sandals.  If you use a stepladder:  Make sure that it is fully opened. Do not climb  a closed stepladder.  Make sure that both sides of the stepladder are locked into place.  Ask someone to hold it for you, if possible.  Clearly mark and make sure that you can see:  Any grab bars or handrails.  First and last steps.  Where the edge of each step is.  Use tools that help you move around (mobility aids) if they are needed. These include:  Canes.  Walkers.  Scooters.  Crutches.  Turn on the lights when you go into a dark area. Replace any light bulbs as soon as they burn out.  Set up your furniture so you have a clear path. Avoid moving your furniture around.  If any of your floors are uneven, fix them.  If there are any pets around you, be aware of where they are.  Review your medicines with your doctor. Some medicines can make you feel dizzy. This can increase your chance of falling. Ask your doctor what other things that you can do to help prevent falls. This information is not intended to replace advice given to you by your health care provider. Make sure you discuss any questions you have with your health care provider. Document Released: 01/27/2009 Document Revised: 09/08/2015 Document Reviewed: 05/07/2014 Elsevier Interactive Patient Education  2017 [employer].

## 2018-10-27 ENCOUNTER — Other Ambulatory Visit: Payer: Self-pay

## 2018-10-27 ENCOUNTER — Ambulatory Visit (INDEPENDENT_AMBULATORY_CARE_PROVIDER_SITE_OTHER): Payer: Medicare Other | Admitting: Internal Medicine

## 2018-10-27 ENCOUNTER — Encounter: Payer: Self-pay | Admitting: Internal Medicine

## 2018-10-27 VITALS — BP 112/78 | HR 78 | Ht 70.0 in | Wt 181.0 lb

## 2018-10-27 DIAGNOSIS — M5136 Other intervertebral disc degeneration, lumbar region: Secondary | ICD-10-CM

## 2018-10-27 DIAGNOSIS — E118 Type 2 diabetes mellitus with unspecified complications: Secondary | ICD-10-CM | POA: Diagnosis not present

## 2018-10-27 DIAGNOSIS — E785 Hyperlipidemia, unspecified: Secondary | ICD-10-CM | POA: Diagnosis not present

## 2018-10-27 DIAGNOSIS — M25832 Other specified joint disorders, left wrist: Secondary | ICD-10-CM

## 2018-10-27 DIAGNOSIS — K279 Peptic ulcer, site unspecified, unspecified as acute or chronic, without hemorrhage or perforation: Secondary | ICD-10-CM

## 2018-10-27 MED ORDER — CELECOXIB 200 MG PO CAPS: 200.0000 mg | ORAL_CAPSULE | Freq: Every day | ORAL | 3 refills | Status: DC

## 2018-10-27 MED ORDER — ROSUVASTATIN CALCIUM 40 MG PO TABS: 40.0000 mg | ORAL_TABLET | Freq: Every day | ORAL | 3 refills | Status: DC

## 2018-10-27 MED ORDER — OMEPRAZOLE 40 MG PO CPDR: 40.0000 mg | DELAYED_RELEASE_CAPSULE | Freq: Every day | ORAL | 3 refills | Status: DC

## 2018-10-27 NOTE — Patient Instructions (Addendum)
Schedule yearly Diabetic Eye exam.  Try [facility] on [address]

## 2018-10-27 NOTE — Progress Notes (Signed)
Date:  10/27/2018   Name:  Marc Bond   DOB:  1945/09/09   MRN:  [id number]   Chief Complaint: Hypertension (Follow up. Just had visit for MAW with Kasey last week.) and Diabetes (Foot Exam. )  Numbness - he has intermittent numbness in his left 5th finger.  It comes and goes.  He spends a lot of time driving his grand children around and rests his left elbow on the door.  He does notice that it tends to be worse after driving.  He denies elbow pain or swelling, neck pain or swelling, or weakness. Hyperlipidemia This is a chronic problem. The problem is controlled. Current antihyperlipidemic treatment includes statins. The current treatment provides significant improvement of lipids.  Gastroesophageal Reflux He reports no abdominal pain, no coughing or no wheezing. Pertinent negatives include no fatigue.  Diabetes He presents for his follow-up diabetic visit. He has type 2 diabetes mellitus. His disease course has been stable. Pertinent negatives for hypoglycemia include no dizziness, headaches or tremors. Pertinent negatives for diabetes include no fatigue, no polydipsia, no polyuria and no weakness.   Lab Results  Component Value Date   HGBA1C 7.2 (H) 04/30/2018   Lab Results  Component Value Date   CHOL 127 04/30/2018   HDL 50 04/30/2018   LDLCALC 60 04/30/2018   TRIG 84 04/30/2018   CHOLHDL 2.5 04/30/2018   Lab Results  Component Value Date   CREATININE 1.28 (H) 04/30/2018   BUN 21 04/30/2018   NA 138 04/30/2018   K 4.9 04/30/2018   CL 103 04/30/2018   CO2 19 (L) 04/30/2018     Review of Systems  Constitutional: Negative for appetite change, fatigue and unexpected weight change.  Eyes: Negative for visual disturbance.  Respiratory: Negative for cough and wheezing.   Cardiovascular: Negative for leg swelling.  Gastrointestinal: Negative for abdominal pain and blood in stool.  Endocrine: Negative for polydipsia and polyuria.  Genitourinary: Negative for dysuria and  hematuria.  Musculoskeletal: Negative for arthralgias and neck pain.  Skin: Negative for color change and rash.  Neurological: Positive for numbness. Negative for dizziness, tremors, weakness and headaches.  Psychiatric/Behavioral: Negative for dysphoric mood and sleep disturbance.    Patient Active Problem List   Diagnosis Date Noted  . 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 Renal cyst, right 10/29/2016  . Pulmonary cavitary lesion 10/16/2016  . 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 Degenerative disc disease, lumbar 09/29/2015  . Primary osteoarthritis of right knee 09/29/2015  . Irritable colon 03/12/2015  . Controlled type 2 diabetes mellitus without complication ([place]) 02/77/4128  . Dyslipidemia 03/12/2015  . Enlarged prostate 03/12/2015  . Failure of erection 03/12/2015  . Seborrhea capitis 03/12/2015  . Gastroduodenal ulcer 03/12/2015  . Acquired deformities of toe 03/03/2012  . Bilateral cataracts 12/10/2011    Allergies  Allergen Reactions  . Ace Inhibitors Other (See Comments)    Sx hypotension on low dose    Past Surgical History:  Procedure Laterality Date  . COLONOSCOPY  11/2011   normal - 10 yr follow up Dr. Epimenio Foot  . PROSTATE SURGERY      Social History   Tobacco Use  . Smoking status: Former Smoker    Packs/day: 0.50    Years: 13.00    Pack years: 6.50    Types: Cigarettes, Pipe, Cigars    Quit date: 1980    Years since quitting: 40.5  . Smokeless tobacco: Never Used  . Tobacco comment: smoking cessation materials not  required  Substance Use Topics  . Alcohol use: No    Alcohol/week: 0.0 standard drinks  . Drug use: Not Currently     Medication list has been reviewed and updated.  Current Meds  Medication Sig  . alfuzosin (UROXATRAL) 10 MG 24 hr tablet Take 10 mg by mouth daily.  Marland Kitchen. aspirin 81 MG chewable tablet Chew by mouth daily.  . celecoxib (CELEBREX) 200 MG capsule TAKE ONE CAPSULE BY MOUTH EVERY DAY  . cholecalciferol (VITAMIN D)  1000 units tablet Take 1,000 Units by mouth daily.  Marland Kitchen. CIALIS 20 MG tablet take 1 tablet by mouth once daily if needed  . empagliflozin (JARDIANCE) 10 MG TABS tablet Take by mouth.  Marland Kitchen. glucose blood (ONETOUCH VERIO) test strip ONETOUCH VERIO (In Vitro Strip) - Historical Medication  1 daily Active  . MULTIPLE VITAMINS PO Take 1 tablet by mouth daily.  Marland Kitchen. omeprazole (PRILOSEC) 40 MG capsule TAKE 1 CAPSULE BY MOUTH EVERY DAY  . ramipril (ALTACE) 2.5 MG capsule Take 1 capsule (2.5 mg total) by mouth daily.  . rosuvastatin (CRESTOR) 40 MG tablet TAKE 1 TABLET(40 MG) BY MOUTH DAILY  . sitaGLIPtin-metformin (JANUMET) 50-1000 MG tablet Take 1 tablet by mouth 2 (two) times daily with a meal.    PHQ 2/9 Scores 10/27/2018 10/20/2018 10/16/2017 10/16/2017  PHQ - 2 Score 0 0 0 0  PHQ- 9 Score 0 0 0 -    BP Readings from Last 3 Encounters:  10/27/18 112/78  10/20/18 104/72  04/30/18 118/70    Physical Exam Vitals signs and nursing note reviewed.  Constitutional:      General: He is not in acute distress.    Appearance: He is well-developed.  HENT:     Head: Normocephalic and atraumatic.  Neck:     Musculoskeletal: Normal range of motion.     Vascular: No carotid bruit.  Cardiovascular:     Rate and Rhythm: Normal rate and regular rhythm.     Pulses: Normal pulses.     Heart sounds: No murmur.  Pulmonary:     Effort: Pulmonary effort is normal. No respiratory distress.     Breath sounds: No wheezing or rhonchi.  Musculoskeletal: Normal range of motion.     Left elbow: Normal.     Right lower leg: No edema.     Left lower leg: No edema.     Comments: Numbness lateral left 5th finger  Lymphadenopathy:     Cervical: No cervical adenopathy.  Skin:    General: Skin is warm and dry.     Findings: No rash.  Neurological:     Mental Status: He is alert and oriented to person, place, and time.     Sensory: Sensory deficit (along lateral left 5th finger) present.  Psychiatric:        Attention  and Perception: Attention normal.        Mood and Affect: Mood normal.        Behavior: Behavior normal.        Thought Content: Thought content normal.     Wt Readings from Last 3 Encounters:  10/27/18 181 lb (82.1 kg)  10/20/18 181 lb 12.8 oz (82.5 kg)  04/30/18 184 lb (83.5 kg)    BP 112/78   Pulse 78   Ht 5\' 10"  (1.778 m)   Wt 181 lb (82.1 kg)   SpO2 98%   BMI 25.97 kg/m   Assessment and Plan: 1. Type II diabetes mellitus with complication (HCC) Stable, followed  by Endo but no recent labs Schedule annual DM eye exam and cataract follow up - Hemoglobin A1c  2. Gastroduodenal ulcer Continue daily PPI - omeprazole (PRILOSEC) 40 MG capsule; Take 1 capsule (40 mg total) by mouth daily.  Dispense: 90 capsule; Refill: 3 - CBC with Differential/Platelet  3. Dyslipidemia On statin therapy - rosuvastatin (CRESTOR) 40 MG tablet; Take 1 tablet (40 mg total) by mouth daily.  Dispense: 90 tablet; Refill: 3 - Comprehensive metabolic panel  4. Ulnar impingement syndrome, left Avoid prolonged pressure on left elbow  5. Degenerative disc disease, lumbar unchanged - celecoxib (CELEBREX) 200 MG capsule; Take 1 capsule (200 mg total) by mouth daily.  Dispense: 90 capsule; Refill: 3   Partially dictated using Dragon software. Any errors are unintentional.  Bari EdwardLaura BerglundAnimal nutritionist, MD [facility]  10/27/2018

## 2018-10-28 LAB — CBC WITH DIFFERENTIAL/PLATELET
Basophils Absolute: 0 10*3/uL (ref 0.0–0.2)
Basos: 0 %
EOS (ABSOLUTE): 0.1 10*3/uL (ref 0.0–0.4)
Eos: 1 %
Hematocrit: 40.9 % (ref 37.5–51.0)
Hemoglobin: 13.9 g/dL (ref 13.0–17.7)
Immature Grans (Abs): 0 10*3/uL (ref 0.0–0.1)
Immature Granulocytes: 0 %
Lymphocytes Absolute: 2.4 10*3/uL (ref 0.7–3.1)
Lymphs: 25 %
MCH: 27.7 pg (ref 26.6–33.0)
MCHC: 34 g/dL (ref 31.5–35.7)
MCV: 82 fL (ref 79–97)
Monocytes Absolute: 0.9 10*3/uL (ref 0.1–0.9)
Monocytes: 9 %
Neutrophils Absolute: 6.1 10*3/uL (ref 1.4–7.0)
Neutrophils: 65 %
Platelets: 232 10*3/uL (ref 150–450)
RBC: 5.01 x10E6/uL (ref 4.14–5.80)
RDW: 13.8 % (ref 11.6–15.4)
WBC: 9.4 10*3/uL (ref 3.4–10.8)

## 2018-10-28 LAB — COMPREHENSIVE METABOLIC PANEL
ALT: 19 IU/L (ref 0–44)
AST: 23 IU/L (ref 0–40)
Albumin/Globulin Ratio: 1.4 (ref 1.2–2.2)
Albumin: 4.7 g/dL (ref 3.7–4.7)
Alkaline Phosphatase: 62 IU/L (ref 39–117)
BUN/Creatinine Ratio: 14 (ref 10–24)
BUN: 21 mg/dL (ref 8–27)
Bilirubin Total: 0.6 mg/dL (ref 0.0–1.2)
CO2: 19 mmol/L — ABNORMAL LOW (ref 20–29)
Calcium: 10.5 mg/dL — ABNORMAL HIGH (ref 8.6–10.2)
Chloride: 101 mmol/L (ref 96–106)
Creatinine, Ser: 1.46 mg/dL — ABNORMAL HIGH (ref 0.76–1.27)
GFR calc Af Amer: 54 mL/min/{1.73_m2} — ABNORMAL LOW (ref >59)
GFR calc non Af Amer: 47 mL/min/{1.73_m2} — ABNORMAL LOW (ref >59)
Globulin, Total: 3.3 g/dL (ref 1.5–4.5)
Glucose: 94 mg/dL (ref 65–99)
Potassium: 5.3 mmol/L — ABNORMAL HIGH (ref 3.5–5.2)
Sodium: 136 mmol/L (ref 134–144)
Total Protein: 8 g/dL (ref 6.0–8.5)

## 2018-10-28 LAB — HEMOGLOBIN A1C
Est. average glucose Bld gHb Est-mCnc: 160 mg/dL
Hgb A1c MFr Bld: 7.2 % — ABNORMAL HIGH (ref 4.8–5.6)

## 2018-10-31 ENCOUNTER — Ambulatory Visit: Payer: Self-pay | Admitting: Internal Medicine

## 2018-11-03 ENCOUNTER — Ambulatory Visit: Payer: Self-pay | Admitting: Internal Medicine

## 2018-11-10 ENCOUNTER — Encounter: Payer: Self-pay | Admitting: Internal Medicine

## 2018-11-22 ENCOUNTER — Other Ambulatory Visit: Payer: Self-pay | Admitting: Internal Medicine

## 2018-11-22 DIAGNOSIS — M5136 Other intervertebral disc degeneration, lumbar region: Secondary | ICD-10-CM

## 2018-11-22 DIAGNOSIS — K279 Peptic ulcer, site unspecified, unspecified as acute or chronic, without hemorrhage or perforation: Secondary | ICD-10-CM

## 2018-12-02 ENCOUNTER — Encounter: Payer: Self-pay | Admitting: Internal Medicine

## 2018-12-30 ENCOUNTER — Ambulatory Visit (INDEPENDENT_AMBULATORY_CARE_PROVIDER_SITE_OTHER): Payer: Medicare Other

## 2018-12-30 ENCOUNTER — Other Ambulatory Visit: Payer: Self-pay

## 2018-12-30 DIAGNOSIS — Z23 Encounter for immunization: Secondary | ICD-10-CM | POA: Diagnosis not present

## 2018-12-30 NOTE — Progress Notes (Signed)
Flu shot

## 2019-02-11 DIAGNOSIS — N400 Enlarged prostate without lower urinary tract symptoms: Secondary | ICD-10-CM | POA: Diagnosis not present

## 2019-02-11 DIAGNOSIS — N529 Male erectile dysfunction, unspecified: Secondary | ICD-10-CM | POA: Diagnosis not present

## 2019-03-03 ENCOUNTER — Other Ambulatory Visit: Payer: Self-pay | Admitting: Internal Medicine

## 2019-03-03 DIAGNOSIS — I10 Essential (primary) hypertension: Secondary | ICD-10-CM

## 2019-03-14 IMAGING — CR DG CHEST 2V
2 series · 2 of 2 positions shown · non-contrast
Comparison: None in PACs

CLINICAL DATA: Intermittent cough for several weeks, history of
positive PPD many years ago.

EXAM:
CHEST  2 VIEW

[chest pa]
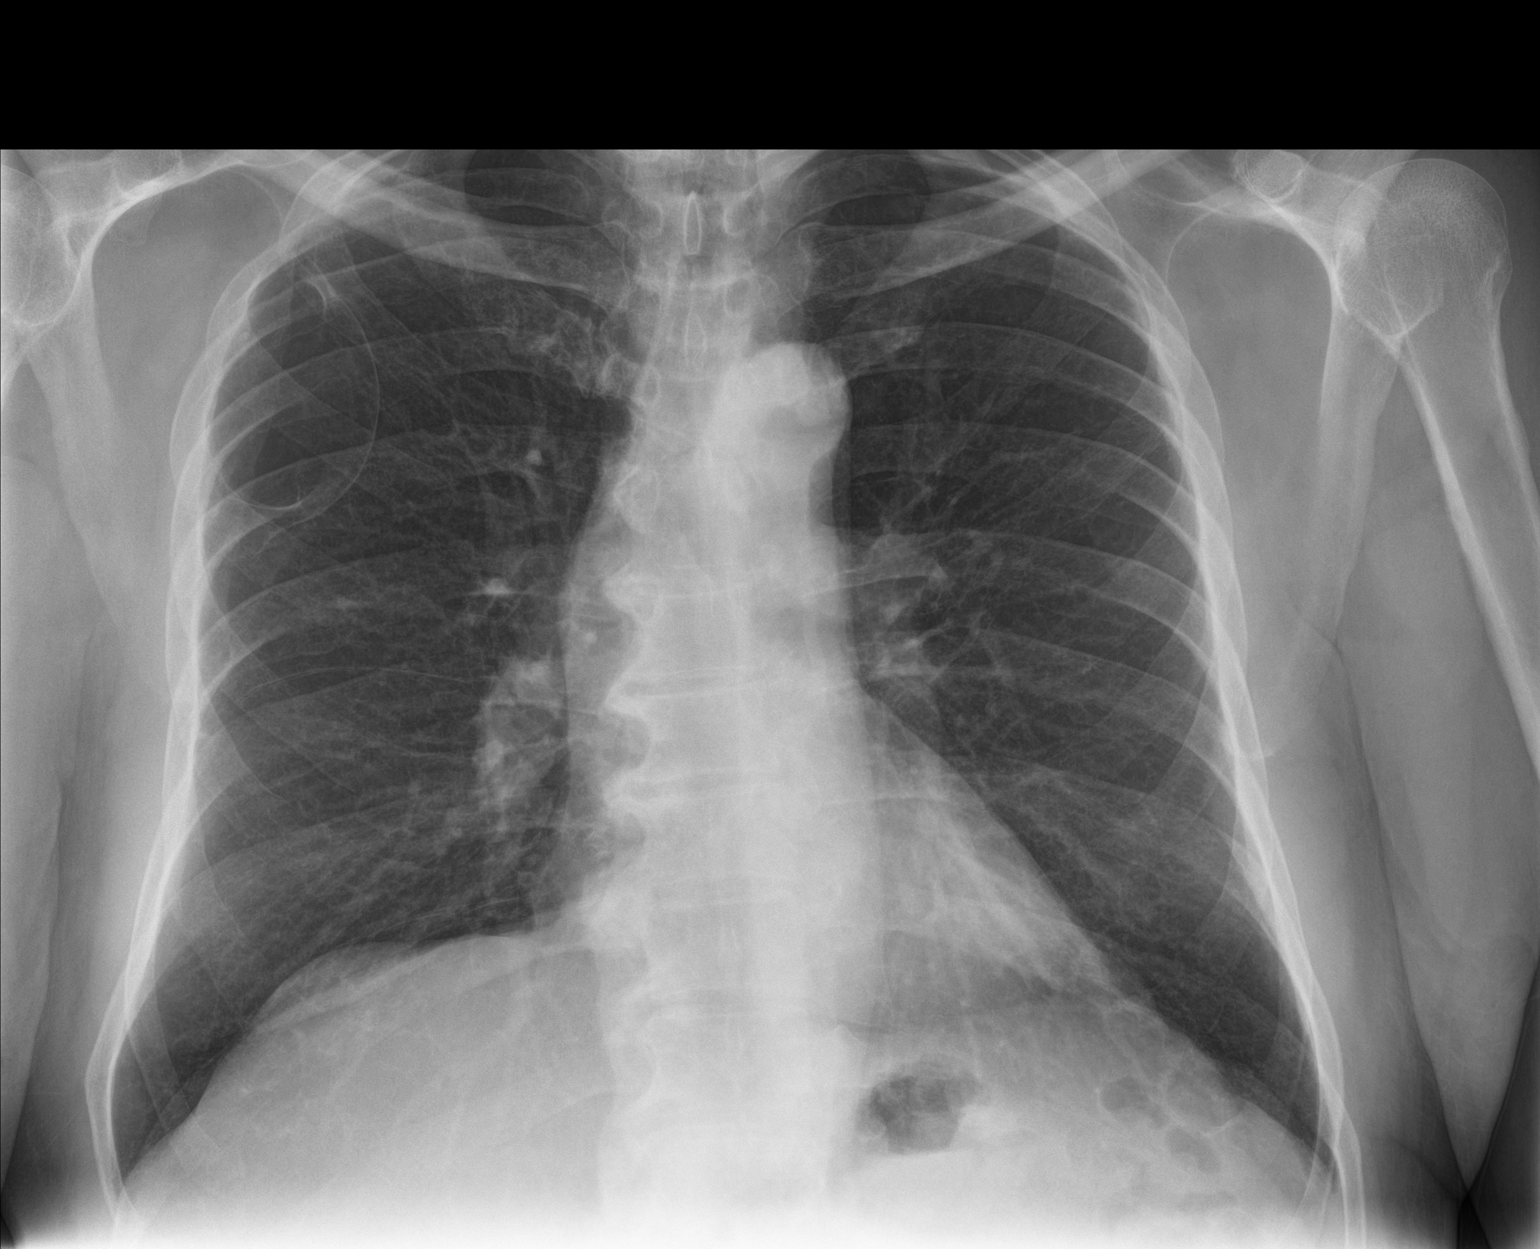

[chest lat]
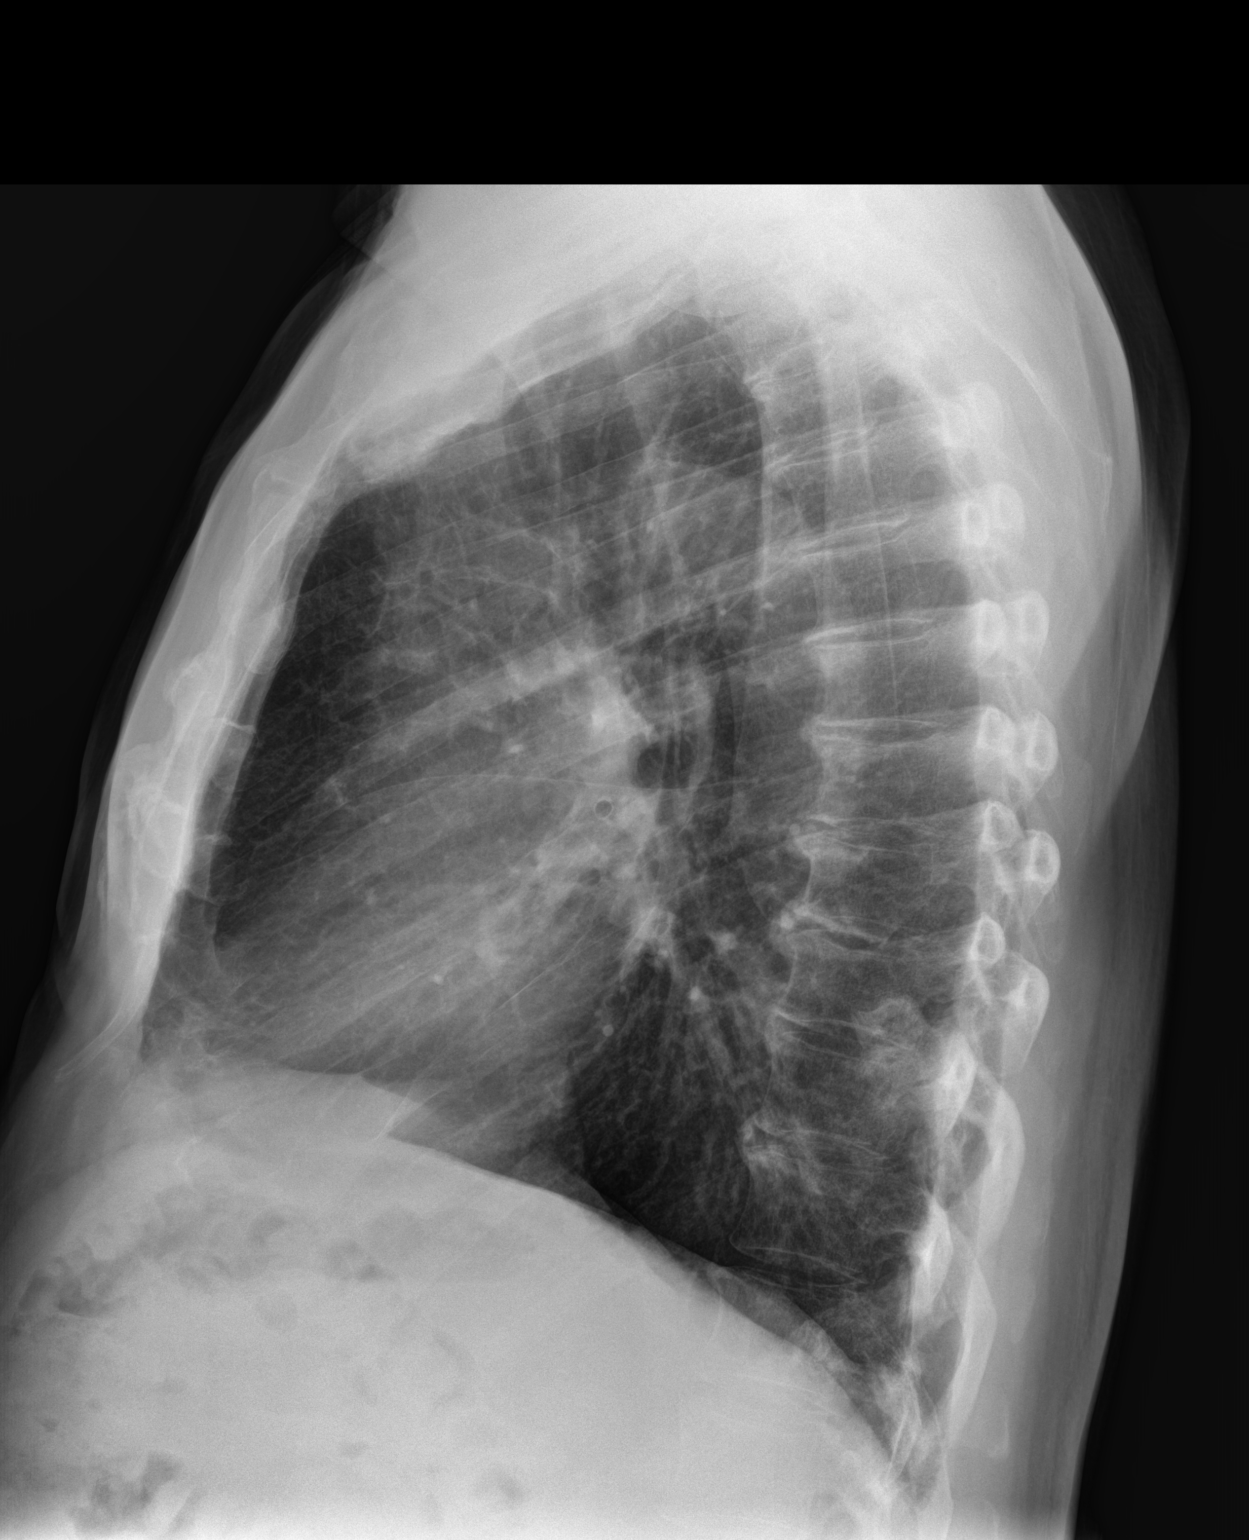

[2 of 2 positions shown; findings below may reference images not displayed]

FINDINGS: The lungs are well-expanded. There is a cyst-like appearance of the
periphery of the right upper lobe. No air-fluid levels are observed.
No pulmonary parenchymal masses are demonstrated. The heart and
pulmonary vascularity are normal. There is multilevel degenerative
disc disease of the thoracic spine with lateral bridging endplate
osteophytes in the mid and lower thoracic spine. There is no pleural
effusion.
IMPRESSION: Probable pneumatocoele versus thin walled cyst or cavitary lesion in
the right upper lobe. Given the lack of previous studies and the
history of a positive PPD, chest CT scanning is recommended.

Otherwise no acute cardiopulmonary abnormality.

## 2019-03-25 ENCOUNTER — Other Ambulatory Visit: Payer: Self-pay

## 2019-03-25 ENCOUNTER — Encounter: Payer: Self-pay | Admitting: Internal Medicine

## 2019-03-25 ENCOUNTER — Ambulatory Visit (INDEPENDENT_AMBULATORY_CARE_PROVIDER_SITE_OTHER): Payer: Medicare Other | Admitting: Internal Medicine

## 2019-03-25 VITALS — BP 101/64 | HR 103 | Temp 98.3°F | Ht 70.0 in | Wt 181.0 lb

## 2019-03-25 DIAGNOSIS — J0100 Acute maxillary sinusitis, unspecified: Secondary | ICD-10-CM

## 2019-03-25 MED ORDER — DOXYCYCLINE HYCLATE 100 MG PO TABS: 100.0000 mg | ORAL_TABLET | Freq: Two times a day (BID) | ORAL | 0 refills | Status: AC

## 2019-03-25 NOTE — Progress Notes (Signed)
Date:  03/25/2019   Name:  Marc Bond   DOB:  03-15-1946   MRN:  [id number]  I connected with this patient, Marc Bond, by telephone at the patient's home.  I verified that I am speaking with the correct person using two identifiers. This visit was conducted via telephone due to the Covid-19 outbreak from my office at [facility] in [place], [place]. I discussed the limitations, risks, security and privacy concerns of performing an evaluation and management service by telephone. I also discussed with the patient that there may be a patient responsible charge related to this service. The patient expressed understanding and agreed to proceed.  Chief Complaint: Cough (Cough with green mucous. X 1 week. No sob, or change of taste or smell.  )    Colonoscopy 2013 Immunization History  Administered Date(s) Administered  . Fluad Quad(high Dose 65+) 12/30/2018  . Influenza, High Dose Seasonal PF 12/19/2017  . Influenza,inj,Quad PF,6+ Mos 12/26/2016  . Influenza-Unspecified 02/16/2015, 12/26/2016  . Pneumococcal Conjugate-13 09/29/2015, 03/27/2017  . Pneumococcal Polysaccharide-23 04/17/2008, 12/04/2012  . Tdap 10/24/2017  . Zoster Recombinat (Shingrix) 01/22/2018, 03/25/2018    Cough This is a new problem. The current episode started in the past 7 days. The problem has been unchanged. The problem occurs every few minutes. The cough is productive of sputum. Associated symptoms include nasal congestion and postnasal drip. Pertinent negatives include no chills, fever, headaches, shortness of breath or wheezing. Risk factors: around some kids in his girl friends daycare with cough but no known covid. He has tried nothing for the symptoms.  He has chronic sinus congestion and drainage which may be causing the cough.  He does not have any other sx or exposures to suggest Covid infection.  Lab Results  Component Value Date   CREATININE 1.46 (H) 10/27/2018   BUN 21 10/27/2018   NA 136  10/27/2018   K 5.3 (H) 10/27/2018   CL 101 10/27/2018   CO2 19 (L) 10/27/2018   Lab Results  Component Value Date   CHOL 127 04/30/2018   HDL 50 04/30/2018   LDLCALC 60 04/30/2018   TRIG 84 04/30/2018   CHOLHDL 2.5 04/30/2018   Lab Results  Component Value Date   TSH 1.30 09/29/2017   Lab Results  Component Value Date   HGBA1C 7.2 (H) 10/27/2018     Review of Systems  Constitutional: Negative for chills and fever.  HENT: Positive for congestion and postnasal drip. Negative for trouble swallowing.   Respiratory: Positive for cough. Negative for chest tightness, shortness of breath and wheezing.   Gastrointestinal: Negative for abdominal pain, diarrhea and nausea.  Neurological: Negative for dizziness, light-headedness and headaches.    Patient Active Problem List   Diagnosis Date Noted  . 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 Renal cyst, right 10/29/2016  . Pulmonary cavitary lesion 10/16/2016  . 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 Degenerative disc disease, lumbar 09/29/2015  . Primary osteoarthritis of right knee 09/29/2015  . Irritable colon 03/12/2015  . Type II diabetes mellitus with complication ([place]) 32/67/1245  . Hyperlipidemia associated with type 2 diabetes mellitus ([place]) 03/12/2015  . Enlarged prostate 03/12/2015  . Failure of erection 03/12/2015  . Seborrhea capitis 03/12/2015  . Peptic ulcer disease 03/12/2015  . Acquired deformities of toe 03/03/2012  . Bilateral cataracts 12/10/2011    Allergies  Allergen Reactions  . Ace Inhibitors Other (See Comments)    Sx hypotension on  low dose    Past Surgical History:  Procedure Laterality Date  . COLONOSCOPY  11/2011   normal - 10 yr follow up Dr. Earlean Polka  . PROSTATE SURGERY      Social History   Tobacco Use  . Smoking status: Former Smoker    Packs/day: 0.50    Years: 13.00    Pack years: 6.50    Types: Cigarettes, Pipe, Cigars    Quit  date: 1980    Years since quitting: 40.9  . Smokeless tobacco: Never Used  . Tobacco comment: smoking cessation materials not required  Substance Use Topics  . Alcohol use: No    Alcohol/week: 0.0 standard drinks  . Drug use: Not Currently     Medication list has been reviewed and updated.  Current Meds  Medication Sig  . alfuzosin (UROXATRAL) 10 MG 24 hr tablet Take 10 mg by mouth daily.  Marland Kitchen aspirin 81 MG chewable tablet Chew by mouth daily.  . celecoxib (CELEBREX) 200 MG capsule TAKE 1 CAPSULE BY MOUTH EVERY DAY  . cholecalciferol (VITAMIN D) 1000 units tablet Take 1,000 Units by mouth daily.  Marland Kitchen CIALIS 20 MG tablet take 1 tablet by mouth once daily if needed  . empagliflozin (JARDIANCE) 10 MG TABS tablet Take by mouth.  Marland Kitchen glucose blood (ONETOUCH VERIO) test strip ONETOUCH VERIO (In Vitro Strip) - Historical Medication  1 daily Active  . MULTIPLE VITAMINS PO Take 1 tablet by mouth daily.  Marland Kitchen omeprazole (PRILOSEC) 40 MG capsule TAKE 1 CAPSULE BY MOUTH EVERY DAY  . ramipril (ALTACE) 2.5 MG capsule TAKE 1 CAPSULE(2.5 MG) BY MOUTH DAILY  . rosuvastatin (CRESTOR) 40 MG tablet Take 1 tablet (40 mg total) by mouth daily.  . sitaGLIPtin-metformin (JANUMET) 50-1000 MG tablet Take 1 tablet by mouth 2 (two) times daily with a meal.    PHQ 2/9 Scores 03/25/2019 10/27/2018 10/20/2018 10/16/2017  PHQ - 2 Score 0 0 0 0  PHQ- 9 Score 0 0 0 0    BP Readings from Last 3 Encounters:  03/25/19 101/64  10/27/18 112/78  10/20/18 104/72    Physical Exam Pulmonary:     Effort: Pulmonary effort is normal.     Comments: No cough or dyspnea appreciated Neurological:     Mental Status: He is alert.  Psychiatric:        Attention and Perception: Attention normal.        Mood and Affect: Mood normal.     Wt Readings from Last 3 Encounters:  03/25/19 181 lb (82.1 kg)  10/27/18 181 lb (82.1 kg)  10/20/18 181 lb 12.8 oz (82.5 kg)    BP 101/64   Pulse (!) 103   Temp 98.3 F (36.8 C)   Ht 5'  10" (1.778 m)   Wt 181 lb (82.1 kg)   BMI 25.97 kg/m   Assessment and Plan: 1. Acute non-recurrent maxillary sinusitis Continue fluids, otc cough medication - doxycycline (VIBRA-TABS) 100 MG tablet; Take 1 tablet (100 mg total) by mouth 2 (two) times daily for 10 days.  Dispense: 20 tablet; Refill: 0   Partially dictated using Animal nutritionist. Any errors are unintentional.  Bari Edward, MD [facility]  03/25/2019

## 2019-05-11 ENCOUNTER — Other Ambulatory Visit: Payer: Self-pay

## 2019-05-11 ENCOUNTER — Ambulatory Visit (INDEPENDENT_AMBULATORY_CARE_PROVIDER_SITE_OTHER): Payer: Medicare Other | Admitting: Internal Medicine

## 2019-05-11 ENCOUNTER — Encounter: Payer: Self-pay | Admitting: Internal Medicine

## 2019-05-11 ENCOUNTER — Other Ambulatory Visit: Payer: Self-pay | Admitting: Internal Medicine

## 2019-05-11 VITALS — BP 102/70 | HR 85 | Temp 97.8°F | Ht 70.0 in | Wt 189.0 lb

## 2019-05-11 DIAGNOSIS — E1169 Type 2 diabetes mellitus with other specified complication: Secondary | ICD-10-CM | POA: Diagnosis not present

## 2019-05-11 DIAGNOSIS — Z0000 Encounter for general adult medical examination without abnormal findings: Secondary | ICD-10-CM | POA: Diagnosis not present

## 2019-05-11 DIAGNOSIS — M5136 Other intervertebral disc degeneration, lumbar region: Secondary | ICD-10-CM

## 2019-05-11 DIAGNOSIS — M51369 Other intervertebral disc degeneration, lumbar region without mention of lumbar back pain or lower extremity pain: Secondary | ICD-10-CM

## 2019-05-11 DIAGNOSIS — E785 Hyperlipidemia, unspecified: Secondary | ICD-10-CM | POA: Diagnosis not present

## 2019-05-11 DIAGNOSIS — N400 Enlarged prostate without lower urinary tract symptoms: Secondary | ICD-10-CM | POA: Diagnosis not present

## 2019-05-11 DIAGNOSIS — E118 Type 2 diabetes mellitus with unspecified complications: Secondary | ICD-10-CM | POA: Diagnosis not present

## 2019-05-11 DIAGNOSIS — K279 Peptic ulcer, site unspecified, unspecified as acute or chronic, without hemorrhage or perforation: Secondary | ICD-10-CM

## 2019-05-11 LAB — POCT URINALYSIS DIPSTICK
Bilirubin, UA: NEGATIVE
Blood, UA: NEGATIVE
Glucose, UA: NEGATIVE
Ketones, UA: NEGATIVE
Leukocytes, UA: NEGATIVE
Nitrite, UA: NEGATIVE
Protein, UA: POSITIVE — AB
Spec Grav, UA: 1.015 (ref 1.010–1.025)
Urobilinogen, UA: 0.2 E.U./dL
pH, UA: 5 (ref 5.0–8.0)

## 2019-05-11 MED ORDER — OMEPRAZOLE 40 MG PO CPDR: 40.0000 mg | DELAYED_RELEASE_CAPSULE | Freq: Every day | ORAL | 3 refills | Status: DC

## 2019-05-11 MED ORDER — CELECOXIB 200 MG PO CAPS: 200.0000 mg | ORAL_CAPSULE | Freq: Every day | ORAL | 3 refills | Status: DC

## 2019-05-11 NOTE — Progress Notes (Signed)
Date:  05/11/2019   Name:  Marc Bond   DOB:  10-30-1945   MRN:  [id number]   Chief Complaint: Annual Exam (A1C needed. Eye exam postponed but will get done this year.) Marc Bond is a 74 y.o. male who presents today for his Complete Annual Exam. He feels well. He reports exercising walking daily. He reports he is sleeping well.   Colonoscopy  11/2011  Immunization History  Administered Date(s) Administered  . Fluad Quad(high Dose 65+) 12/30/2018  . Influenza, High Dose Seasonal PF 12/19/2017  . Influenza,inj,Quad PF,6+ Mos 12/26/2016  . Influenza-Unspecified 02/16/2015, 12/26/2016  . Moderna SARS-COVID-2 Vaccination 05/09/2019  . Pneumococcal Conjugate-13 09/29/2015, 03/27/2017  . Pneumococcal Polysaccharide-23 04/17/2008, 12/04/2012  . Tdap 10/24/2017  . Zoster Recombinat (Shingrix) 01/22/2018, 03/25/2018    Hyperlipidemia This is a chronic problem. The problem is controlled. Pertinent negatives include no chest pain, myalgias or shortness of breath. Current antihyperlipidemic treatment includes statins. The current treatment provides significant improvement of lipids.  Diabetes He presents for his follow-up diabetic visit. He has type 2 diabetes mellitus. His disease course has been stable. Pertinent negatives for hypoglycemia include no dizziness, headaches or nervousness/anxiousness. Pertinent negatives for diabetes include no chest pain and no fatigue. Current diabetic treatment includes oral agent (dual therapy) (janumet). He is compliant with treatment all of the time. He monitors blood glucose at home 1-2 x per day. There is no change in his home blood glucose trend. His breakfast blood glucose is taken between 6-7 am. His breakfast blood glucose range is generally 110-130 mg/dl. His dinner blood glucose is taken between 6-7 pm. His dinner blood glucose range is generally 130-140 mg/dl. An ACE inhibitor/angiotensin II receptor blocker is being taken. Eye exam is not current.    Gastroesophageal Reflux He complains of heartburn. He reports no abdominal pain, no chest pain, no choking or no wheezing. This is a recurrent problem. The problem occurs rarely. Pertinent negatives include no fatigue. He has tried a PPI for the symptoms. The treatment provided significant relief.    Lab Results  Component Value Date   CREATININE 1.46 (H) 10/27/2018   BUN 21 10/27/2018   NA 136 10/27/2018   K 5.3 (H) 10/27/2018   CL 101 10/27/2018   CO2 19 (L) 10/27/2018   Lab Results  Component Value Date   CHOL 127 04/30/2018   HDL 50 04/30/2018   LDLCALC 60 04/30/2018   TRIG 84 04/30/2018   CHOLHDL 2.5 04/30/2018   Lab Results  Component Value Date   TSH 1.30 09/29/2017   Lab Results  Component Value Date   HGBA1C 7.2 (H) 10/27/2018     Review of Systems  Constitutional: Negative for appetite change, chills, diaphoresis, fatigue and unexpected weight change.  HENT: Negative for hearing loss, tinnitus, trouble swallowing and voice change.   Eyes: Negative for visual disturbance.  Respiratory: Negative for choking, shortness of breath and wheezing.   Cardiovascular: Negative for chest pain, palpitations and leg swelling.  Gastrointestinal: Positive for heartburn. Negative for abdominal pain, blood in stool, constipation and diarrhea.  Genitourinary: Positive for frequency (nocturia x 4). Negative for difficulty urinating, dysuria, hematuria and urgency.  Musculoskeletal: Positive for arthralgias and back pain. Negative for myalgias.  Skin: Negative for color change and rash.  Neurological: Negative for dizziness, syncope and headaches.  Hematological: Negative for adenopathy.  Psychiatric/Behavioral: Negative for dysphoric mood and sleep disturbance. The patient is not nervous/anxious.     Patient Active Problem List   Diagnosis  Date Noted  . 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 Renal cyst, right 10/29/2016  . Pulmonary cavitary lesion 10/16/2016  . PPD  positive, treated 10/16/2016  . Degenerative disc disease, lumbar 09/29/2015  . Primary osteoarthritis of right knee 09/29/2015  . Irritable colon 03/12/2015  . Type II diabetes mellitus with complication (HCC) 03/12/2015  . Hyperlipidemia associated with type 2 diabetes mellitus (HCC) 03/12/2015  . Enlarged prostate 03/12/2015  . Failure of erection 03/12/2015  . Seborrhea capitis 03/12/2015  . Peptic ulcer disease 03/12/2015  . Acquired deformities of toe 03/03/2012  . Bilateral cataracts 12/10/2011    Allergies  Allergen Reactions  . Ace Inhibitors Other (See Comments)    Sx hypotension on low dose    Past Surgical History:  Procedure Laterality Date  . COLONOSCOPY  11/2011   normal - 10 yr follow up Dr. Earlean Polka  . PROSTATE SURGERY      Social History   Tobacco Use  . Smoking status: Former Smoker    Packs/day: 0.50    Years: 13.00    Pack years: 6.50    Types: Cigarettes, Pipe, Cigars    Quit date: 1980    Years since quitting: 41.0  . Smokeless tobacco: Never Used  . Tobacco comment: smoking cessation materials not required  Substance Use Topics  . Alcohol use: No    Alcohol/week: 0.0 standard drinks  . Drug use: Not Currently     Medication list has been reviewed and updated.  Current Meds  Medication Sig  . alfuzosin (UROXATRAL) 10 MG 24 hr tablet Take 10 mg by mouth daily.  Marland Kitchen aspirin 81 MG chewable tablet Chew by mouth daily.  . celecoxib (CELEBREX) 200 MG capsule TAKE 1 CAPSULE BY MOUTH EVERY DAY  . cholecalciferol (VITAMIN D) 1000 units tablet Take 1,000 Units by mouth daily.  Marland Kitchen CIALIS 20 MG tablet take 1 tablet by mouth once daily if needed  . glucose blood (ONETOUCH VERIO) test strip ONETOUCH VERIO (In Vitro Strip) - Historical Medication  1 daily Active  . MULTIPLE VITAMINS PO Take 1 tablet by mouth daily.  Marland Kitchen omeprazole (PRILOSEC) 40 MG capsule TAKE 1 CAPSULE BY MOUTH EVERY DAY  . ramipril (ALTACE) 2.5 MG capsule TAKE 1 CAPSULE(2.5 MG) BY MOUTH  DAILY  . rosuvastatin (CRESTOR) 40 MG tablet Take 1 tablet (40 mg total) by mouth daily.  . sitaGLIPtin-metformin (JANUMET) 50-1000 MG tablet Take 1 tablet by mouth 2 (two) times daily with a meal.    PHQ 2/9 Scores 03/25/2019 10/27/2018 10/20/2018 10/16/2017  PHQ - 2 Score 0 0 0 0  PHQ- 9 Score 0 0 0 0    BP Readings from Last 3 Encounters:  05/11/19 102/70  03/25/19 101/64  10/27/18 112/78    Physical Exam Vitals and nursing note reviewed.  Constitutional:      Appearance: Normal appearance. He is well-developed.  HENT:     Head: Normocephalic.     Right Ear: Tympanic membrane, ear canal and external ear normal.     Left Ear: Tympanic membrane, ear canal and external ear normal.     Nose: Nose normal.     Mouth/Throat:     Pharynx: Uvula midline.  Eyes:     Conjunctiva/sclera: Conjunctivae normal.     Pupils: Pupils are equal, round, and reactive to light.  Neck:     Thyroid: No thyromegaly.     Vascular: No carotid bruit.  Cardiovascular:     Rate and Rhythm: Normal rate and regular  rhythm.     Heart sounds: Normal heart sounds.  Pulmonary:     Effort: Pulmonary effort is normal.     Breath sounds: Normal breath sounds. No wheezing.  Chest:     Breasts:        Right: No mass.        Left: No mass.  Abdominal:     General: Bowel sounds are normal.     Palpations: Abdomen is soft.     Tenderness: There is no abdominal tenderness.  Musculoskeletal:        General: Normal range of motion.     Cervical back: Normal range of motion and neck supple.  Lymphadenopathy:     Cervical: No cervical adenopathy.  Skin:    General: Skin is warm and dry.  Neurological:     Mental Status: He is alert and oriented to person, place, and time.     Deep Tendon Reflexes: Reflexes are normal and symmetric.  Psychiatric:        Speech: Speech normal.        Behavior: Behavior normal.        Thought Content: Thought content normal.        Judgment: Judgment normal.     Wt Readings  from Last 3 Encounters:  05/11/19 189 lb (85.7 kg)  03/25/19 181 lb (82.1 kg)  10/27/18 181 lb (82.1 kg)    BP 102/70   Pulse 85   Temp 97.8 F (36.6 C) (Oral)   Ht 5\' 10"  (1.778 m)   Wt 189 lb (85.7 kg)   SpO2 97%   BMI 27.12 kg/m   Assessment and Plan: 1. Annual physical exam Normal exam Continue healthy diet, regular exercise  2. Type II diabetes mellitus with complication (HCC) Clinically stable by exam and report without s/s of hypoglycemia. DM complicated by lipids. Tolerating medications janumet-  well without side effects or other concerns. - Comprehensive metabolic panel - Hemoglobin A1c - POCT urinalysis dipstick  3. Hyperlipidemia associated with type 2 diabetes mellitus ([place]) Tolerating statin medication without side effects at this time LDL is at goal of < 70 on current dose Continue same therapy without change at this time. - Lipid panel  4. Enlarged prostate Followed by Urology - pt requests PSA done today DRE deferred to Urology - PSA  5. Peptic ulcer disease Symptoms well controlled on daily PPI No red flag signs such as weight loss, n/v, melena Will continue omeprazole daily. - CBC with Differential/Platelet - omeprazole (PRILOSEC) 40 MG capsule; Take 1 capsule (40 mg total) by mouth daily.  Dispense: 90 capsule; Refill: 3  6. Degenerative disc disease, lumbar Low back pain is well controlled on Celebrex daily PRN - celecoxib (CELEBREX) 200 MG capsule; Take 1 capsule (200 mg total) by mouth daily.  Dispense: 90 capsule; Refill: 3   Partially dictated using Editor, commissioning. Any errors are unintentional.  Halina Maidens, MD [facility]  05/11/2019

## 2019-05-12 LAB — CBC WITH DIFFERENTIAL/PLATELET
Basophils Absolute: 0 10*3/uL (ref 0.0–0.2)
Basos: 0 %
EOS (ABSOLUTE): 0.1 10*3/uL (ref 0.0–0.4)
Eos: 1 %
Hematocrit: 37.4 % — ABNORMAL LOW (ref 37.5–51.0)
Hemoglobin: 13 g/dL (ref 13.0–17.7)
Immature Grans (Abs): 0 10*3/uL (ref 0.0–0.1)
Immature Granulocytes: 0 %
Lymphocytes Absolute: 1.7 10*3/uL (ref 0.7–3.1)
Lymphs: 18 %
MCH: 28.6 pg (ref 26.6–33.0)
MCHC: 34.8 g/dL (ref 31.5–35.7)
MCV: 82 fL (ref 79–97)
Monocytes Absolute: 0.8 10*3/uL (ref 0.1–0.9)
Monocytes: 8 %
Neutrophils Absolute: 6.8 10*3/uL (ref 1.4–7.0)
Neutrophils: 73 %
Platelets: 205 10*3/uL (ref 150–450)
RBC: 4.54 x10E6/uL (ref 4.14–5.80)
RDW: 13.6 % (ref 11.6–15.4)
WBC: 9.3 10*3/uL (ref 3.4–10.8)

## 2019-05-12 LAB — LIPID PANEL
Chol/HDL Ratio: 2.3 ratio (ref 0.0–5.0)
Cholesterol, Total: 101 mg/dL (ref 100–199)
HDL: 44 mg/dL (ref >39)
LDL Chol Calc (NIH): 42 mg/dL (ref 0–99)
Triglycerides: 74 mg/dL (ref 0–149)
VLDL Cholesterol Cal: 15 mg/dL (ref 5–40)

## 2019-05-12 LAB — COMPREHENSIVE METABOLIC PANEL
ALT: 10 IU/L (ref 0–44)
AST: 12 IU/L (ref 0–40)
Albumin/Globulin Ratio: 1.4 (ref 1.2–2.2)
Albumin: 4.1 g/dL (ref 3.7–4.7)
Alkaline Phosphatase: 67 IU/L (ref 39–117)
BUN/Creatinine Ratio: 13 (ref 10–24)
BUN: 21 mg/dL (ref 8–27)
Bilirubin Total: 0.5 mg/dL (ref 0.0–1.2)
CO2: 18 mmol/L — ABNORMAL LOW (ref 20–29)
Calcium: 9.5 mg/dL (ref 8.6–10.2)
Chloride: 105 mmol/L (ref 96–106)
Creatinine, Ser: 1.57 mg/dL — ABNORMAL HIGH (ref 0.76–1.27)
GFR calc Af Amer: 49 mL/min/{1.73_m2} — ABNORMAL LOW (ref >59)
GFR calc non Af Amer: 43 mL/min/{1.73_m2} — ABNORMAL LOW (ref >59)
Globulin, Total: 3 g/dL (ref 1.5–4.5)
Glucose: 108 mg/dL — ABNORMAL HIGH (ref 65–99)
Potassium: 4.8 mmol/L (ref 3.5–5.2)
Sodium: 138 mmol/L (ref 134–144)
Total Protein: 7.1 g/dL (ref 6.0–8.5)

## 2019-05-12 LAB — PSA: Prostate Specific Ag, Serum: 2.7 ng/mL (ref 0.0–4.0)

## 2019-05-12 LAB — HEMOGLOBIN A1C
Est. average glucose Bld gHb Est-mCnc: 157 mg/dL
Hgb A1c MFr Bld: 7.1 % — ABNORMAL HIGH (ref 4.8–5.6)

## 2019-05-21 DIAGNOSIS — H25013 Cortical age-related cataract, bilateral: Secondary | ICD-10-CM | POA: Diagnosis not present

## 2019-05-21 DIAGNOSIS — H31001 Unspecified chorioretinal scars, right eye: Secondary | ICD-10-CM | POA: Diagnosis not present

## 2019-05-21 DIAGNOSIS — E119 Type 2 diabetes mellitus without complications: Secondary | ICD-10-CM | POA: Diagnosis not present

## 2019-05-21 LAB — HM DIABETES EYE EXAM

## 2019-09-08 ENCOUNTER — Other Ambulatory Visit: Payer: Self-pay | Admitting: Internal Medicine

## 2019-09-08 DIAGNOSIS — K279 Peptic ulcer, site unspecified, unspecified as acute or chronic, without hemorrhage or perforation: Secondary | ICD-10-CM

## 2019-09-08 NOTE — Telephone Encounter (Signed)
Requested Prescriptions  Pending Prescriptions Disp Refills  . omeprazole (PRILOSEC) 40 MG capsule [Pharmacy Med Name: OMEPRAZOLE DR 40 MG CAPSULE] 90 capsule 1    Sig: TAKE 1 CAPSULE BY MOUTH EVERY DAY     Gastroenterology: Proton Pump Inhibitors Passed - 09/08/2019 12:50 AM      Passed - Valid encounter within last 12 months    Recent Outpatient Visits          4 months ago Annual physical exam   [facility] Reubin Milan, MD   5 months ago Acute non-recurrent maxillary sinusitis   [facility] Reubin Milan, MD   10 months ago Type II diabetes mellitus with complication [facility])   [facility] Reubin Milan, MD   1 year ago Dyslipidemia   [facility] Reubin Milan, MD   1 year ago Acute non-recurrent maxillary sinusitis   [facility] Reubin Milan, MD      Future Appointments            In 1 month Judithann Graves Nyoka Cowden, MD [facility]   In 8 months Judithann Graves, Nyoka Cowden, MD [facility]           Previous RX was sent to [facility] in [place], [place]

## 2019-10-03 ENCOUNTER — Other Ambulatory Visit: Payer: Self-pay | Admitting: Internal Medicine

## 2019-10-03 DIAGNOSIS — E785 Hyperlipidemia, unspecified: Secondary | ICD-10-CM

## 2019-10-03 NOTE — Telephone Encounter (Signed)
Requested Prescriptions  Pending Prescriptions Disp Refills  . rosuvastatin (CRESTOR) 40 MG tablet [Pharmacy Med Name: ROSUVASTATIN 40MG  TABLETS] 90 tablet 3    Sig: TAKE 1 TABLET(40 MG) BY MOUTH DAILY     Cardiovascular:  Antilipid - Statins Failed - 10/03/2019  6:21 AM      Failed - LDL in normal range and within 360 days    LDL Chol Calc (NIH)  Date Value Ref Range Status  05/11/2019 42 0 - 99 mg/dL Final         Passed - Total Cholesterol in normal range and within 360 days    Cholesterol, Total  Date Value Ref Range Status  05/11/2019 101 100 - 199 mg/dL Final         Passed - HDL in normal range and within 360 days    HDL  Date Value Ref Range Status  05/11/2019 44 >39 mg/dL Final         Passed - Triglycerides in normal range and within 360 days    Triglycerides  Date Value Ref Range Status  05/11/2019 74 0 - 149 mg/dL Final         Passed - Patient is not pregnant      Passed - Valid encounter within last 12 months    Recent Outpatient Visits          4 months ago Annual physical exam   [facility], MD   6 months ago Acute non-recurrent maxillary sinusitis   [facility], MD   11 months ago Type II diabetes mellitus with complication [facility])   [facility], MD   1 year ago Dyslipidemia   [facility], MD   1 year ago Acute non-recurrent maxillary sinusitis   [facility], MD      Future Appointments            In 1 month Reubin Milan Judithann Graves, MD [facility]   In 7 months [facility], Judithann Graves, MD [facility]

## 2019-10-08 ENCOUNTER — Telehealth: Payer: Self-pay | Admitting: Internal Medicine

## 2019-10-08 NOTE — Telephone Encounter (Signed)
Pt has a Muscular condition in his back that he has had for a while/ Pt states normally Dr. Judithann Graves prescribes prednisone for it and he is having this issue currently and he wanted to have a RX for prednisone sent to the pharmacy / Pt scheduled the next available appt but is not sure he can wait that long/ please advise if the prednisone can be called in until his appt or if he doesn't need the appt ? Office can contact Ms. Willeen Cass

## 2019-10-08 NOTE — Telephone Encounter (Signed)
Patient's girlfriend requesting call back to discuss a possible earlier appointment.

## 2019-10-08 NOTE — Telephone Encounter (Signed)
Pt scheduled for Monday. Pt aware.  KP

## 2019-10-08 NOTE — Telephone Encounter (Signed)
I spoke to patient and he said that Monday June 28 at 3:40 is fine. I also inform him that dr berglund is out of the office for a week and next available slot will not be until July 8.

## 2019-10-08 NOTE — Telephone Encounter (Signed)
Caregiver called to ask the nurse or doctor to call in a script for a medication that the patient was  Taking before, Prednisone.  Please advise and let patient know if he has to come in or if it can be sent to his local pharmacy.  CB# [phone] or [phone]

## 2019-10-08 NOTE — Telephone Encounter (Signed)
Called pt to schedule and earlier appt. Dr. Judithann Graves did not prescribe him prednisone for back pain she prescribed Celebrex. He needs to be seen in office if he feels like he needs something else. Told pt not to take any Nsaids to continue to take Celebrex until his appt. Pt verbalized understanding.   KP

## 2019-10-12 ENCOUNTER — Other Ambulatory Visit: Payer: Self-pay

## 2019-10-12 ENCOUNTER — Encounter: Payer: Self-pay | Admitting: Internal Medicine

## 2019-10-12 ENCOUNTER — Ambulatory Visit (INDEPENDENT_AMBULATORY_CARE_PROVIDER_SITE_OTHER): Payer: Medicare Other | Admitting: Internal Medicine

## 2019-10-12 VITALS — BP 94/52 | HR 80 | Temp 97.4°F | Ht 70.0 in | Wt 187.0 lb

## 2019-10-12 DIAGNOSIS — M5136 Other intervertebral disc degeneration, lumbar region: Secondary | ICD-10-CM

## 2019-10-12 DIAGNOSIS — E118 Type 2 diabetes mellitus with unspecified complications: Secondary | ICD-10-CM

## 2019-10-12 MED ORDER — PREDNISONE 10 MG PO TABS: 10.0000 mg | ORAL_TABLET | ORAL | 0 refills | Status: AC

## 2019-10-12 NOTE — Progress Notes (Signed)
Date:  10/12/2019   Name:  Marc Bond   DOB:  05-Apr-1946   MRN:  [id number]   Chief Complaint: Back Pain (Back is the same as last time. Has been given prednisone for 5 days int he past. Wants that again. He said he only needs thi severy 3-4 years. )  Back Pain This is a recurrent problem. The current episode started in the past 7 days. The problem occurs constantly. The problem has been gradually worsening since onset. The pain is present in the lumbar spine. The pain does not radiate. The pain is moderate. The symptoms are aggravated by twisting and sitting. Pertinent negatives include no abdominal pain, chest pain, headaches, numbness or weakness. He has tried NSAIDs for the symptoms.  Diabetes He presents for his follow-up (followed by [facility] but no visits in the past year) diabetic visit. He has type 2 diabetes mellitus. His disease course has been fluctuating. Pertinent negatives for hypoglycemia include no headaches or tremors. Pertinent negatives for diabetes include no chest pain, no fatigue, no polyuria and no weakness. Current diabetic treatment includes oral agent (dual therapy). He is compliant with treatment all of the time. There is no change in his home blood glucose trend. An ACE inhibitor/angiotensin II receptor blocker is being taken. Eye exam is current.    Lab Results  Component Value Date   CREATININE 1.57 (H) 05/11/2019   BUN 21 05/11/2019   NA 138 05/11/2019   K 4.8 05/11/2019   CL 105 05/11/2019   CO2 18 (L) 05/11/2019   Lab Results  Component Value Date   CHOL 101 05/11/2019   HDL 44 05/11/2019   LDLCALC 42 05/11/2019   TRIG 74 05/11/2019   CHOLHDL 2.3 05/11/2019   Lab Results  Component Value Date   TSH 1.30 09/29/2017   Lab Results  Component Value Date   HGBA1C 7.1 (H) 05/11/2019   Lab Results  Component Value Date   WBC 9.3 05/11/2019   HGB 13.0 05/11/2019   HCT 37.4 (L) 05/11/2019   MCV 82 05/11/2019   PLT 205 05/11/2019   Lab  Results  Component Value Date   ALT 10 05/11/2019   AST 12 05/11/2019   ALKPHOS 67 05/11/2019   BILITOT 0.5 05/11/2019     Review of Systems  Constitutional: Negative for appetite change, fatigue and unexpected weight change.  Eyes: Negative for visual disturbance.  Respiratory: Negative for cough, shortness of breath and wheezing.   Cardiovascular: Negative for chest pain, palpitations and leg swelling.  Gastrointestinal: Negative for abdominal pain.  Endocrine: Negative for polyuria.  Musculoskeletal: Positive for back pain. Negative for joint swelling.  Skin: Negative for color change and rash.  Neurological: Negative for tremors, weakness, numbness and headaches.  Psychiatric/Behavioral: Negative for dysphoric mood.    Patient Active Problem List   Diagnosis Date Noted  . 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 Renal cyst, right 10/29/2016  . Pulmonary cavitary lesion 10/16/2016  . 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 Degenerative disc disease, lumbar 09/29/2015  . Primary osteoarthritis of right knee 09/29/2015  . Irritable colon 03/12/2015  . Type II diabetes mellitus with complication ([place]) 34/74/2595  . Hyperlipidemia associated with type 2 diabetes mellitus ([place]) 03/12/2015  . Enlarged prostate 03/12/2015  . Failure of erection 03/12/2015  . Seborrhea capitis 03/12/2015  . Peptic ulcer disease 03/12/2015  . Acquired deformities of toe 03/03/2012  . Bilateral cataracts 12/10/2011    Allergies  Allergen Reactions  .  Ace Inhibitors Other (See Comments)    Sx hypotension on low dose    Past Surgical History:  Procedure Laterality Date  . COLONOSCOPY  11/2011   normal - 10 yr follow up Dr. Earlean Polka  . PROSTATE SURGERY      Social History   Tobacco Use  . Smoking status: Former Smoker    Packs/day: 0.50    Years: 13.00    Pack years: 6.50    Types: Cigarettes, Pipe, Cigars    Quit date: 1980    Years since quitting: 41.5  . Smokeless tobacco:  Never Used  . Tobacco comment: smoking cessation materials not required  Vaping Use  . Vaping Use: Never used  Substance Use Topics  . Alcohol use: No    Alcohol/week: 0.0 standard drinks  . Drug use: Not Currently     Medication list has been reviewed and updated.  Current Meds  Medication Sig  . alfuzosin (UROXATRAL) 10 MG 24 hr tablet Take 10 mg by mouth daily.  Marland Kitchen aspirin 81 MG chewable tablet Chew by mouth daily.  . celecoxib (CELEBREX) 200 MG capsule TAKE 1 CAPSULE BY MOUTH EVERY DAY  . cholecalciferol (VITAMIN D) 1000 units tablet Take 1,000 Units by mouth daily.  Marland Kitchen CIALIS 20 MG tablet take 1 tablet by mouth once daily if needed  . glucose blood (ONETOUCH VERIO) test strip ONETOUCH VERIO (In Vitro Strip) - Historical Medication  1 daily Active  . MULTIPLE VITAMINS PO Take 1 tablet by mouth daily.  Marland Kitchen omeprazole (PRILOSEC) 40 MG capsule TAKE 1 CAPSULE BY MOUTH EVERY DAY  . ramipril (ALTACE) 2.5 MG capsule TAKE 1 CAPSULE(2.5 MG) BY MOUTH DAILY  . rosuvastatin (CRESTOR) 40 MG tablet TAKE 1 TABLET(40 MG) BY MOUTH DAILY  . sitaGLIPtin-metformin (JANUMET) 50-1000 MG tablet Take 1 tablet by mouth 2 (two) times daily with a meal.    PHQ 2/9 Scores 10/12/2019 03/25/2019 10/27/2018 10/20/2018  PHQ - 2 Score 0 0 0 0  PHQ- 9 Score 0 0 0 0    GAD 7 : Generalized Anxiety Score 10/12/2019  Nervous, Anxious, on Edge 0  Control/stop worrying 0  Worry too much - different things 0  Trouble relaxing 0  Restless 0  Easily annoyed or irritable 0  Afraid - awful might happen 0  Total GAD 7 Score 0  Anxiety Difficulty Not difficult at all    BP Readings from Last 3 Encounters:  10/12/19 (!) 94/52  05/11/19 102/70  03/25/19 101/64    Physical Exam Vitals and nursing note reviewed.  Constitutional:      General: He is not in acute distress.    Appearance: Normal appearance. He is well-developed.  HENT:     Head: Normocephalic and atraumatic.  Cardiovascular:     Rate and Rhythm:  Normal rate and regular rhythm.     Pulses: Normal pulses.     Heart sounds: No murmur heard.   Pulmonary:     Effort: Pulmonary effort is normal. No respiratory distress.     Breath sounds: No wheezing or rhonchi.  Musculoskeletal:     Cervical back: Normal range of motion.     Lumbar back: Bony tenderness present. No spasms. Decreased range of motion. Negative right straight leg raise test and negative left straight leg raise test.     Right lower leg: No edema.     Left lower leg: No edema.  Lymphadenopathy:     Cervical: No cervical adenopathy.  Skin:    General:  Skin is warm and dry.     Capillary Refill: Capillary refill takes less than 2 seconds.     Findings: No rash.  Neurological:     General: No focal deficit present.     Mental Status: He is alert and oriented to person, place, and time.     Motor: Motor function is intact.     Gait: Gait is intact.     Deep Tendon Reflexes:     Reflex Scores:      Patellar reflexes are 2+ on the right side and 2+ on the left side. Psychiatric:        Behavior: Behavior normal.        Thought Content: Thought content normal.     Wt Readings from Last 3 Encounters:  10/12/19 187 lb (84.8 kg)  05/11/19 189 lb (85.7 kg)  03/25/19 181 lb (82.1 kg)    BP (!) 94/52 (BP Location: Right Arm, Patient Position: Sitting, Cuff Size: Normal)   Pulse 80   Temp (!) 97.4 F (36.3 C) (Oral)   Ht 5\' 10"  (1.778 m)   Wt 187 lb (84.8 kg)   SpO2 98%   BMI 26.83 kg/m   Assessment and Plan: 1. Degenerative disc disease, lumbar Continue Celebrex daily Will add short course of prednisone per pt request If no improvement, consider muscle relaxant - predniSONE (DELTASONE) 10 MG tablet; Take 1 tablet (10 mg total) by mouth as directed for 6 days. Take 6,5,4,3,2,1 then stop  Dispense: 21 tablet; Refill: 0  2. Type II diabetes mellitus with complication (HCC) Clinically stable by exam and report without s/s of hypoglycemia. DM complicated by  lipids. Tolerating medications well without side effects or other concerns. No recent Endo visit so will order routine labs - Comprehensive metabolic panel - Hemoglobin A1c   Partially dictated using . Any errors are unintentional.  Animal nutritionist, MD [facility]  10/12/2019

## 2019-10-13 ENCOUNTER — Encounter: Payer: Self-pay | Admitting: Internal Medicine

## 2019-10-13 DIAGNOSIS — N1832 Chronic kidney disease, stage 3b: Secondary | ICD-10-CM | POA: Insufficient documentation

## 2019-10-13 DIAGNOSIS — N183 Chronic kidney disease, stage 3 unspecified: Secondary | ICD-10-CM | POA: Insufficient documentation

## 2019-10-13 LAB — COMPREHENSIVE METABOLIC PANEL
ALT: 16 IU/L (ref 0–44)
AST: 22 IU/L (ref 0–40)
Albumin/Globulin Ratio: 1.4 (ref 1.2–2.2)
Albumin: 4.2 g/dL (ref 3.7–4.7)
Alkaline Phosphatase: 69 IU/L (ref 48–121)
BUN/Creatinine Ratio: 14 (ref 10–24)
BUN: 25 mg/dL (ref 8–27)
Bilirubin Total: 0.4 mg/dL (ref 0.0–1.2)
CO2: 19 mmol/L — ABNORMAL LOW (ref 20–29)
Calcium: 10.1 mg/dL (ref 8.6–10.2)
Chloride: 103 mmol/L (ref 96–106)
Creatinine, Ser: 1.74 mg/dL — ABNORMAL HIGH (ref 0.76–1.27)
GFR calc Af Amer: 43 mL/min/{1.73_m2} — ABNORMAL LOW (ref >59)
GFR calc non Af Amer: 38 mL/min/{1.73_m2} — ABNORMAL LOW (ref >59)
Globulin, Total: 3 g/dL (ref 1.5–4.5)
Glucose: 107 mg/dL — ABNORMAL HIGH (ref 65–99)
Potassium: 4.6 mmol/L (ref 3.5–5.2)
Sodium: 138 mmol/L (ref 134–144)
Total Protein: 7.2 g/dL (ref 6.0–8.5)

## 2019-10-13 LAB — HEMOGLOBIN A1C
Est. average glucose Bld gHb Est-mCnc: 154 mg/dL
Hgb A1c MFr Bld: 7 % — ABNORMAL HIGH (ref 4.8–5.6)

## 2019-10-21 ENCOUNTER — Ambulatory Visit: Payer: Medicare Other | Admitting: Internal Medicine

## 2019-10-21 ENCOUNTER — Ambulatory Visit: Payer: Medicare Other

## 2019-11-05 ENCOUNTER — Ambulatory Visit: Payer: Medicare Other | Admitting: Internal Medicine

## 2019-11-09 ENCOUNTER — Encounter: Payer: Self-pay | Admitting: Internal Medicine

## 2019-11-09 ENCOUNTER — Ambulatory Visit (INDEPENDENT_AMBULATORY_CARE_PROVIDER_SITE_OTHER): Payer: Medicare Other

## 2019-11-09 ENCOUNTER — Ambulatory Visit (INDEPENDENT_AMBULATORY_CARE_PROVIDER_SITE_OTHER): Payer: Medicare Other | Admitting: Internal Medicine

## 2019-11-09 ENCOUNTER — Other Ambulatory Visit: Payer: Self-pay

## 2019-11-09 VITALS — BP 100/68 | HR 86 | Temp 97.8°F | Resp 16 | Ht 70.0 in | Wt 183.6 lb

## 2019-11-09 VITALS — BP 98/68 | HR 81 | Temp 97.8°F | Ht 70.0 in | Wt 183.6 lb

## 2019-11-09 DIAGNOSIS — Z0000 Encounter for general adult medical examination without abnormal findings: Secondary | ICD-10-CM | POA: Diagnosis not present

## 2019-11-09 DIAGNOSIS — E118 Type 2 diabetes mellitus with unspecified complications: Secondary | ICD-10-CM

## 2019-11-09 DIAGNOSIS — K5901 Slow transit constipation: Secondary | ICD-10-CM

## 2019-11-09 NOTE — Patient Instructions (Signed)
Marc Bond , Thank you for taking time to come for your Medicare Wellness Visit. I appreciate your ongoing commitment to your health goals. Please review the following plan we discussed and let me know if I can assist you in the future.   Screening recommendations/referrals: Colonoscopy: done 02/21/12. Repeat in 2023 Recommended yearly ophthalmology/optometry visit for glaucoma screening and checkup Recommended yearly dental visit for hygiene and checkup  Vaccinations: Influenza vaccine: done 12/30/18 Pneumococcal vaccine: done 03/27/17 Tdap vaccine: done 10/24/17 Shingles vaccine: done 01/22/18 & 03/25/18   Covid-19:  Done 05/09/19 & 06/09/19  Advanced directives: Advance directive discussed with you today. I have provided a copy for you to complete at home and have notarized. Once this is complete please bring a copy in to our office so we can scan it into your chart.  Conditions/risks identified: Recommend drinking 6-8 glasses of water per day  Next appointment: Follow up in one year for your annual wellness visit.   Preventive Care 28 Years and Older, Male Preventive care refers to lifestyle choices and visits with your health care provider that can promote health and wellness. What does preventive care include?  A yearly physical exam. This is also called an annual well check.  Dental exams once or twice a year.  Routine eye exams. Ask your health care provider how often you should have your eyes checked.  Personal lifestyle choices, including:  Daily care of your teeth and gums.  Regular physical activity.  Eating a healthy diet.  Avoiding tobacco and drug use.  Limiting alcohol use.  Practicing safe sex.  Taking low doses of aspirin every day.  Taking vitamin and mineral supplements as recommended by your health care provider. What happens during an annual well check? The services and screenings done by your health care provider during your annual well check will  depend on your age, overall health, lifestyle risk factors, and family history of disease. Counseling  Your health care provider may ask you questions about your:  Alcohol use.  Tobacco use.  Drug use.  Emotional well-being.  Home and relationship well-being.  Sexual activity.  Eating habits.  History of falls.  Memory and ability to understand (cognition).  Work and work Astronomer. Screening  You may have the following tests or measurements:  Height, weight, and BMI.  Blood pressure.  Lipid and cholesterol levels. These may be checked every 5 years, or more frequently if you are over 47 years old.  Skin check.  Lung cancer screening. You may have this screening every year starting at age 98 if you have a 30-pack-year history of smoking and currently smoke or have quit within the past 15 years.  Fecal occult blood test (FOBT) of the stool. You may have this test every year starting at age 43.  Flexible sigmoidoscopy or colonoscopy. You may have a sigmoidoscopy every 5 years or a colonoscopy every 10 years starting at age 65.  Prostate cancer screening. Recommendations will vary depending on your family history and other risks.  Hepatitis C blood test.  Hepatitis B blood test.  Sexually transmitted disease (STD) testing.  Diabetes screening. This is done by checking your blood sugar (glucose) after you have not eaten for a while (fasting). You may have this done every 1-3 years.  Abdominal aortic aneurysm (AAA) screening. You may need this if you are a current or former smoker.  Osteoporosis. You may be screened starting at age 10 if you are at high risk. Talk with your health  care provider about your test results, treatment options, and if necessary, the need for more tests. Vaccines  Your health care provider may recommend certain vaccines, such as:  Influenza vaccine. This is recommended every year.  Tetanus, diphtheria, and acellular pertussis (Tdap,  Td) vaccine. You may need a Td booster every 10 years.  Zoster vaccine. You may need this after age 65.  Pneumococcal 13-valent conjugate (PCV13) vaccine. One dose is recommended after age 63.  Pneumococcal polysaccharide (PPSV23) vaccine. One dose is recommended after age 46. Talk to your health care provider about which screenings and vaccines you need and how often you need them. This information is not intended to replace advice given to you by your health care provider. Make sure you discuss any questions you have with your health care provider. Document Released: 04/29/2015 Document Revised: 12/21/2015 Document Reviewed: 02/01/2015 Elsevier Interactive Patient Education  2017 [place] Prevention in the Home Falls can cause injuries. They can happen to people of all ages. There are many things you can do to make your home safe and to help prevent falls. What can I do on the outside of my home?  Regularly fix the edges of walkways and driveways and fix any cracks.  Remove anything that might make you trip as you walk through a door, such as a raised step or threshold.  Trim any bushes or trees on the path to your home.  Use bright outdoor lighting.  Clear any walking paths of anything that might make someone trip, such as rocks or tools.  Regularly check to see if handrails are loose or broken. Make sure that both sides of any steps have handrails.  Any raised decks and porches should have guardrails on the edges.  Have any leaves, snow, or ice cleared regularly.  Use sand or salt on walking paths during winter.  Clean up any spills in your garage right away. This includes oil or grease spills. What can I do in the bathroom?  Use night lights.  Install grab bars by the toilet and in the tub and shower. Do not use towel bars as grab bars.  Use non-skid mats or decals in the tub or shower.  If you need to sit down in the shower, use a plastic, non-slip  stool.  Keep the floor dry. Clean up any water that spills on the floor as soon as it happens.  Remove soap buildup in the tub or shower regularly.  Attach bath mats securely with double-sided non-slip rug tape.  Do not have throw rugs and other things on the floor that can make you trip. What can I do in the bedroom?  Use night lights.  Make sure that you have a light by your bed that is easy to reach.  Do not use any sheets or blankets that are too big for your bed. They should not hang down onto the floor.  Have a firm chair that has side arms. You can use this for support while you get dressed.  Do not have throw rugs and other things on the floor that can make you trip. What can I do in the kitchen?  Clean up any spills right away.  Avoid walking on wet floors.  Keep items that you use a lot in easy-to-reach places.  If you need to reach something above you, use a strong step stool that has a grab bar.  Keep electrical cords out of the way.  Do not use floor  polish or wax that makes floors slippery. If you must use wax, use non-skid floor wax.  Do not have throw rugs and other things on the floor that can make you trip. What can I do with my stairs?  Do not leave any items on the stairs.  Make sure that there are handrails on both sides of the stairs and use them. Fix handrails that are broken or loose. Make sure that handrails are as long as the stairways.  Check any carpeting to make sure that it is firmly attached to the stairs. Fix any carpet that is loose or worn.  Avoid having throw rugs at the top or bottom of the stairs. If you do have throw rugs, attach them to the floor with carpet tape.  Make sure that you have a light switch at the top of the stairs and the bottom of the stairs. If you do not have them, ask someone to add them for you. What else can I do to help prevent falls?  Wear shoes that:  Do not have high heels.  Have rubber bottoms.  Are  comfortable and fit you well.  Are closed at the toe. Do not wear sandals.  If you use a stepladder:  Make sure that it is fully opened. Do not climb a closed stepladder.  Make sure that both sides of the stepladder are locked into place.  Ask someone to hold it for you, if possible.  Clearly mark and make sure that you can see:  Any grab bars or handrails.  First and last steps.  Where the edge of each step is.  Use tools that help you move around (mobility aids) if they are needed. These include:  Canes.  Walkers.  Scooters.  Crutches.  Turn on the lights when you go into a dark area. Replace any light bulbs as soon as they burn out.  Set up your furniture so you have a clear path. Avoid moving your furniture around.  If any of your floors are uneven, fix them.  If there are any pets around you, be aware of where they are.  Review your medicines with your doctor. Some medicines can make you feel dizzy. This can increase your chance of falling. Ask your doctor what other things that you can do to help prevent falls. This information is not intended to replace advice given to you by your health care provider. Make sure you discuss any questions you have with your health care provider. Document Released: 01/27/2009 Document Revised: 09/08/2015 Document Reviewed: 05/07/2014 Elsevier Interactive Patient Education  2017 [employer].

## 2019-11-09 NOTE — Patient Instructions (Signed)
MOM 1-1/2 dose daily for 5-7 days to see how it works.  Aim for 1-2 stools per day.  If needed, you can increase the daily dose every week or so to get the desired effect.

## 2019-11-09 NOTE — Progress Notes (Addendum)
Subjective:   Marc Bond is a 74 y.o. male who presents for Medicare Annual/Subsequent preventive examination.  Review of Systems     Cardiac Risk Factors include: advanced age (>65men, >79 women);diabetes mellitus;dyslipidemia;male gender;hypertension     Objective:    Today's Vitals   11/09/19 1005 11/09/19 1006  BP: 100/68   Pulse: 86   Resp: 16   Temp: 97.8 F (36.6 C)   TempSrc: Oral   SpO2: 96%   Weight: 183 lb 9.6 oz (83.3 kg)   Height: 5\' 10"  (1.778 m)   PainSc:  6    Body mass index is 26.34 kg/m.  Advanced Directives 11/09/2019 10/20/2018 10/16/2017 09/29/2015  Does Patient Have a Medical Advance Directive? No Yes Yes Yes  Type of Advance Directive - Healthcare Power of [place];Living will Healthcare Power of [place];Living will Healthcare Power of [place];Living will  Does patient want to make changes to medical advance directive? - - - No - Patient declined  Copy of Healthcare Power of Attorney in Chart? - No - copy requested No - copy requested -  Would patient like information on creating a medical advance directive? Yes (MAU/Ambulatory/Procedural Areas - Information given) - - -    Current Medications (verified) Outpatient Encounter Medications as of 11/09/2019  Medication Sig  . alfuzosin (UROXATRAL) 10 MG 24 hr tablet Take 10 mg by mouth daily.  11/11/2019 aspirin 81 MG chewable tablet Chew by mouth daily.  . celecoxib (CELEBREX) 200 MG capsule TAKE 1 CAPSULE BY MOUTH EVERY DAY  . cholecalciferol (VITAMIN D) 1000 units tablet Take 1,000 Units by mouth daily.  Marland Kitchen CIALIS 20 MG tablet take 1 tablet by mouth once daily if needed  . glucose blood (ONETOUCH VERIO) test strip ONETOUCH VERIO (In Vitro Strip) - Historical Medication  1 daily Active  . MULTIPLE VITAMINS PO Take 1 tablet by mouth daily.  Marland Kitchen omeprazole (PRILOSEC) 40 MG capsule TAKE 1 CAPSULE BY MOUTH EVERY DAY  . ramipril (ALTACE) 2.5 MG capsule TAKE 1 CAPSULE(2.5 MG) BY MOUTH DAILY  . rosuvastatin  (CRESTOR) 40 MG tablet TAKE 1 TABLET(40 MG) BY MOUTH DAILY  . sitaGLIPtin-metformin (JANUMET) 50-1000 MG tablet Take 1 tablet by mouth 2 (two) times daily with a meal.   No facility-administered encounter medications on file as of 11/09/2019.    Allergies (verified) Ace inhibitors   History: Past Medical History:  Diagnosis Date  . Bilateral cataracts   . CKD (chronic kidney disease) stage 3, GFR 30-59 ml/min   . Degenerative disc disease, lumbar   . Diabetes mellitus without complication (HCC)   . Enlarged prostate   . GERD (gastroesophageal reflux disease)   . Hyperlipidemia   . Hypertension   . IBS (irritable bowel syndrome)   . Mood disorder (HCC) 08/20/2016  . Peptic ulcer disease   . Primary osteoarthritis of right knee   . Renal cyst, right    Past Surgical History:  Procedure Laterality Date  . COLONOSCOPY  11/2011   normal - 10 yr follow up Dr. 12/2011  . PROSTATE SURGERY     Family History  Problem Relation Age of Onset  . Hypertension Sister   . Kidney disease Mother   . Hypertension Mother   . Emphysema Father   . Diabetes Brother   . Diabetes Sister   . Hypertension Sister   . Diabetes Brother    Social History   Socioeconomic History  . Marital status: Widowed    Spouse name: Not on file  . Number  of children: 2  . Years of education: Not on file  . Highest education level: 11th grade  Occupational History  . Occupation: Retired  Tobacco Use  . Smoking status: Former Smoker    Packs/day: 0.00    Years: 13.00    Pack years: 0.00    Types: Cigarettes, Pipe, Cigars    Quit date: 1980    Years since quitting: 41.5  . Smokeless tobacco: Never Used  . Tobacco comment: I quit smoking about 40 years ago.  Vaping Use  . Vaping Use: Never used  Substance and Sexual Activity  . Alcohol use: No    Alcohol/week: 0.0 standard drinks  . Drug use: Not Currently  . Sexual activity: Yes  Other Topics Concern  . Not on file  Social History Narrative  .  Not on file   Social Determinants of Health   Financial Resource Strain: Low Risk   . Difficulty of Paying Living Expenses: Not hard at all  Food Insecurity: No Food Insecurity  . Worried About Programme researcher, broadcasting/film/video in the Last Year: Never true  . Ran Out of Food in the Last Year: Never true  Transportation Needs: No Transportation Needs  . Lack of Transportation (Medical): No  . Lack of Transportation (Non-Medical): No  Physical Activity: Sufficiently Active  . Days of Exercise per Week: 5 days  . Minutes of Exercise per Session: 60 min  Stress: No Stress Concern Present  . Feeling of Stress : Not at all  Social Connections: Moderately Isolated  . Frequency of Communication with Friends and Family: More than three times a week  . Frequency of Social Gatherings with Friends and Family: Three times a week  . Attends Religious Services: More than 4 times per year  . Active Member of Clubs or Organizations: No  . Attends Banker Meetings: Never  . Marital Status: Widowed    Tobacco Counseling Counseling given: Not Answered Comment: I quit smoking about 40 years ago.   Clinical Intake:  Pre-visit preparation completed: Yes  Pain : 0-10 Pain Score: 6  Pain Type: Acute pain Pain Location: Abdomen Pain Orientation: Right, Lower Pain Onset: 1 to 4 weeks ago Pain Frequency: Intermittent     BMI - recorded: 26.34 Nutritional Status: BMI 25 -29 Overweight Nutritional Risks: None Diabetes: Yes CBG done?: No Did pt. bring in CBG monitor from home?: No  How often do you need to have someone help you when you read instructions, pamphlets, or other written materials from your doctor or pharmacy?: 1 - Never  Nutrition Risk Assessment:  Has the patient had any N/V/D within the last 2 months?  No  Does the patient have any non-healing wounds?  No  Has the patient had any unintentional weight loss or weight gain?  No   Diabetes:  Is the patient diabetic?  Yes  If  diabetic, was a CBG obtained today?  No  Did the patient bring in their glucometer from home?  No  How often do you monitor your CBG's? Twice daily.   Financial Strains and Diabetes Management:  Are you having any financial strains with the device, your supplies or your medication? No .  Does the patient want to be seen by Chronic Care Management for management of their diabetes?  No  Would the patient like to be referred to a Nutritionist or for Diabetic Management?  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   Diabetic Foot  Exam: Completed 05/11/19.   Interpreter Needed?: No  Information entered by :: Reather LittlerKasey John Vasconcelos LPN   Activities of Daily Living In your present state of health, do you have any difficulty performing the following activities: 11/09/2019  Hearing? N  Comment declines hearing aids  Vision? N  Difficulty concentrating or making decisions? N  Walking or climbing stairs? N  Dressing or bathing? N  Doing errands, shopping? N  Preparing Food and eating ? N  Using the Toilet? N  In the past six months, have you accidently leaked urine? N  Do you have problems with loss of bowel control? N  Managing your Medications? N  Managing your Finances? N  Housekeeping or managing your Housekeeping? N  Some recent data might be hidden    Patient Care Team: Reubin MilanBerglund, Laura H, MD as PCP - General (Internal Medicine) Jeronimo NormaJelesoff, Nicole (Inactive) as Physician Assistant (Endocrinology) Victorino DecemberFriedman, Eugene, MD as Referring Physician (Pulmonary Disease) [facility] (Urology) Lacey JensenSolik, Steven, MD as Consulting Physician (Gastroenterology) Desma PaganiniSmith, Sherri Lyn, AUD as Consulting Physician (Audiology)  Indicate any recent Medical Services you may have received from other than [facility] providers in the past year (date may be approximate).     Assessment:   This is a routine wellness examination for Marc SailsHarry.  Hearing/Vision screen  Hearing Screening    125Hz  250Hz  500Hz  1000Hz  2000Hz  3000Hz  4000Hz  6000Hz  8000Hz   Right ear:           Left ear:           Comments: Pt denies hearing difficulty  Vision Screening Comments: [facility]  Dietary issues and exercise activities discussed: Current Exercise Habits: Home exercise routine, Type of exercise: walking, Time (Minutes): 60, Frequency (Times/Week): 5, Weekly Exercise (Minutes/Week): 300, Intensity: Moderate, Exercise limited by: None identified  Goals    . DIET - INCREASE WATER INTAKE     Recommend to drink at least 6-8 8oz glasses of water per day.      Depression Screen PHQ 2/9 Scores 11/09/2019 10/12/2019 03/25/2019 10/27/2018 10/20/2018 10/16/2017 10/16/2017  PHQ - 2 Score 0 0 0 0 0 0 0  PHQ- 9 Score - 0 0 0 0 0 -    Fall Risk Fall Risk  11/09/2019 10/12/2019 10/27/2018 10/20/2018 10/16/2017  Falls in the past year? 0 0 0 0 No  Number falls in past yr: 0 0 0 0 -  Injury with Fall? 0 0 0 0 -  Risk for fall due to : No Fall Risks No Fall Risks - - Impaired vision;Other (Comment)  Risk for fall due to: Comment - - - - wears eyeglasses, fatigue  Follow up Falls prevention discussed Falls evaluation completed Falls prevention discussed;Falls evaluation completed Falls prevention discussed -    Any stairs in or around the home? No  If so, are there any without handrails? No  Home free of loose throw rugs in walkways, pet beds, electrical cords, etc? Yes  Adequate lighting in your home to reduce risk of falls? Yes   ASSISTIVE DEVICES UTILIZED TO PREVENT FALLS:  Life alert? No  Use of a cane, walker or w/c? No  Grab bars in the bathroom? Yes  Shower chair or bench in shower? Yes  Elevated toilet seat or a handicapped toilet? Yes   TIMED UP AND GO:  Was the test performed? Yes .  Length of time to ambulate 10 feet: 5 sec.   Gait steady and fast without use of assistive device  Cognitive  Function:     6CIT Screen 11/09/2019 10/20/2018 10/16/2017  What Year?  0 points 0 points 0 points  What month? 0 points 0 points 0 points  What time? 0 points 0 points 0 points  Count back from 20 0 points 0 points 0 points  Months in reverse 0 points 0 points 0 points  Repeat phrase 0 points 4 points 10 points  Total Score 0 4 10    Immunizations Immunization History  Administered Date(s) Administered  . Fluad Quad(high Dose 65+) 12/30/2018  . Influenza, High Dose Seasonal PF 12/19/2017  . Influenza,inj,Quad PF,6+ Mos 12/26/2016  . Influenza-Unspecified 02/16/2015, 12/26/2016  . Moderna SARS-COVID-2 Vaccination 05/09/2019, 06/09/2019  . Pneumococcal Conjugate-13 09/29/2015, 03/27/2017  . Pneumococcal Polysaccharide-23 04/17/2008, 12/04/2012  . Tdap 10/24/2017  . Zoster Recombinat (Shingrix) 01/22/2018, 03/25/2018     TDAP status: Up to date   Flu Vaccine status: Up to date   Pneumococcal vaccine status: Up to date   Covid-19 vaccine status: Completed vaccines  Qualifies for Shingles Vaccine? Yes   Zostavax completed Yes   Shingrix Completed?: Yes  Screening Tests Health Maintenance  Topic Date Due  . INFLUENZA VACCINE  11/15/2019  . HEMOGLOBIN A1C  04/12/2020  . FOOT EXAM  05/10/2020  . OPHTHALMOLOGY EXAM  05/20/2020  . COLONOSCOPY  11/25/2021  . TETANUS/TDAP  10/25/2027  . COVID-19 Vaccine  Completed  . PNA vac Low Risk Adult  Completed  . Hepatitis C Screening  Addressed    Health Maintenance  There are no preventive care reminders to display for this patient.  Colorectal cancer screening: Completed 02/21/12. Repeat every 10 years  Lung Cancer Screening: (Low Dose CT Chest recommended if Age 60-80 years, 30 pack-year currently smoking OR have quit w/in 15years.) does not qualify.   Additional Screening:  Hepatitis C Screening: does qualify; Completed 02/21/12  Vision Screening: Recommended annual ophthalmology exams for early detection of glaucoma and other disorders of the eye. Is the patient up to date with their annual  eye exam?  Yes  Who is the provider or what is the name of the office in which the patient attends annual eye exams? [facility]  Dental Screening: Recommended annual dental exams for proper oral hygiene  Community Resource Referral / Chronic Care Management: CRR required this visit?  No   CCM required this visit?  No      Plan:     I have personally reviewed and noted the following in the patient's chart:   . Medical and social history . Use of alcohol, tobacco or illicit drugs  . Current medications and supplements . Functional ability and status . Nutritional status . Physical activity . Advanced directives . List of other physicians . Hospitalizations, surgeries, and ER visits in previous 12 months . Vitals . Screenings to include cognitive, depression, and falls . Referrals and appointments  In addition, I have reviewed and discussed with patient certain preventive protocols, quality metrics, and best practice recommendations. A written personalized care plan for preventive services as well as general preventive health recommendations were provided to patient.      Reather Littler, LPN   [id number]   Nurse Notes: pt c/o RLQ pain in abdomen within the last 3-4 weeks and thinks it is related to digestive issues. Pt has same day appt with Dr. Judithann Graves today.

## 2019-11-09 NOTE — Progress Notes (Signed)
Date:  11/09/2019   Name:  Marc Bond   DOB:  1945/07/03   MRN:  [id number]   Chief Complaint: Diabetes (Foot exam.) and Hypertension  Diabetes He presents for his follow-up diabetic visit. He has type 2 diabetes mellitus. His disease course has been stable. Pertinent negatives for hypoglycemia include no tremors. Pertinent negatives for diabetes include no fatigue, no polydipsia, no polyuria and no weight loss. Current diabetic treatment includes oral agent (monotherapy). He is compliant with treatment all of the time. An ACE inhibitor/angiotensin II receptor blocker is being taken.  Constipation This is a recurrent problem. The stool is described as firm. The patient is not on a high fiber diet. There has been adequate water intake. Associated symptoms include abdominal pain (discomfort in the RLQ) and bloating. Pertinent negatives include no hematochezia, nausea or weight loss. Treatments tried: MOM after 4 days with no stool. The treatment provided mild relief.    Lab Results  Component Value Date   CREATININE 1.74 (H) 10/12/2019   BUN 25 10/12/2019   NA 138 10/12/2019   K 4.6 10/12/2019   CL 103 10/12/2019   CO2 19 (L) 10/12/2019   Lab Results  Component Value Date   CHOL 101 05/11/2019   HDL 44 05/11/2019   LDLCALC 42 05/11/2019   TRIG 74 05/11/2019   CHOLHDL 2.3 05/11/2019   Lab Results  Component Value Date   TSH 1.30 09/29/2017   Lab Results  Component Value Date   HGBA1C 7.0 (H) 10/12/2019   Lab Results  Component Value Date   WBC 9.3 05/11/2019   HGB 13.0 05/11/2019   HCT 37.4 (L) 05/11/2019   MCV 82 05/11/2019   PLT 205 05/11/2019   Lab Results  Component Value Date   ALT 16 10/12/2019   AST 22 10/12/2019   ALKPHOS 69 10/12/2019   BILITOT 0.4 10/12/2019     Review of Systems  Constitutional: Negative for appetite change, fatigue, unexpected weight change and weight loss.  Eyes: Negative for visual disturbance.  Respiratory: Negative for cough  and wheezing.   Cardiovascular: Negative for leg swelling.  Gastrointestinal: Positive for abdominal pain (discomfort in the RLQ), bloating and constipation. Negative for blood in stool, hematochezia and nausea.  Endocrine: Negative for polydipsia and polyuria.  Genitourinary: Negative for dysuria and hematuria.  Skin: Negative for color change and rash.  Neurological: Negative for tremors and numbness.  Psychiatric/Behavioral: Negative for dysphoric mood.    Patient Active Problem List   Diagnosis Date Noted  . CKD (chronic kidney disease) stage 3, GFR 30-59 ml/min 10/13/2019  . 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 Renal cyst, right 10/29/2016  . Pulmonary cavitary lesion 10/16/2016  . 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 Degenerative disc disease, lumbar 09/29/2015  . Primary osteoarthritis of right knee 09/29/2015  . Irritable colon 03/12/2015  . Type II diabetes mellitus with complication (HCC) 03/12/2015  . Hyperlipidemia associated with type 2 diabetes mellitus (HCC) 03/12/2015  . Enlarged prostate 03/12/2015  . Failure of erection 03/12/2015  . Seborrhea capitis 03/12/2015  . Peptic ulcer disease 03/12/2015  . Acquired deformities of toe 03/03/2012  . Bilateral cataracts 12/10/2011    Allergies  Allergen Reactions  . Ace Inhibitors Other (See Comments)    Sx hypotension on low dose    Past Surgical History:  Procedure Laterality Date  . COLONOSCOPY  11/2011   normal - 10 yr follow up Dr. Earlean Polka  . PROSTATE SURGERY  Social History   Tobacco Use  . Smoking status: Former Smoker    Packs/day: 0.00    Years: 13.00    Pack years: 0.00    Types: Cigarettes, Pipe, Cigars    Quit date: 1980    Years since quitting: 41.5  . Smokeless tobacco: Never Used  . Tobacco comment: I quit smoking about 40 years ago.  Vaping Use  . Vaping Use: Never used  Substance Use Topics  . Alcohol use: No    Alcohol/week: 0.0 standard drinks  . Drug use: Not Currently      Medication list has been reviewed and updated.  Current Meds  Medication Sig  . alfuzosin (UROXATRAL) 10 MG 24 hr tablet Take 10 mg by mouth daily.  Marland Kitchen aspirin 81 MG chewable tablet Chew by mouth daily.  . celecoxib (CELEBREX) 200 MG capsule TAKE 1 CAPSULE BY MOUTH EVERY DAY  . cholecalciferol (VITAMIN D) 1000 units tablet Take 1,000 Units by mouth daily.  Marland Kitchen CIALIS 20 MG tablet take 1 tablet by mouth once daily if needed  . glucose blood (ONETOUCH VERIO) test strip ONETOUCH VERIO (In Vitro Strip) - Historical Medication  1 daily Active  . MULTIPLE VITAMINS PO Take 1 tablet by mouth daily.  Marland Kitchen omeprazole (PRILOSEC) 40 MG capsule TAKE 1 CAPSULE BY MOUTH EVERY DAY  . ramipril (ALTACE) 2.5 MG capsule TAKE 1 CAPSULE(2.5 MG) BY MOUTH DAILY  . rosuvastatin (CRESTOR) 40 MG tablet TAKE 1 TABLET(40 MG) BY MOUTH DAILY  . sitaGLIPtin-metformin (JANUMET) 50-1000 MG tablet Take 1 tablet by mouth 2 (two) times daily with a meal.    PHQ 2/9 Scores 11/09/2019 10/12/2019 03/25/2019 10/27/2018  PHQ - 2 Score 0 0 0 0  PHQ- 9 Score - 0 0 0    GAD 7 : Generalized Anxiety Score 10/12/2019  Nervous, Anxious, on Edge 0  Control/stop worrying 0  Worry too much - different things 0  Trouble relaxing 0  Restless 0  Easily annoyed or irritable 0  Afraid - awful might happen 0  Total GAD 7 Score 0  Anxiety Difficulty Not difficult at all    BP Readings from Last 3 Encounters:  11/09/19 98/68  11/09/19 100/68  10/12/19 (!) 94/52    Physical Exam Vitals and nursing note reviewed.  Constitutional:      General: He is not in acute distress.    Appearance: Normal appearance. He is well-developed.  HENT:     Head: Normocephalic and atraumatic.  Cardiovascular:     Rate and Rhythm: Normal rate and regular rhythm.     Pulses: Normal pulses.  Pulmonary:     Effort: Pulmonary effort is normal. No respiratory distress.     Breath sounds: No wheezing or rhonchi.  Abdominal:     General: Bowel sounds  are normal. There is no distension.     Palpations: Abdomen is soft.     Tenderness: There is abdominal tenderness (vague discomfort in the RLQ). There is no guarding or rebound.  Musculoskeletal:     Cervical back: Normal range of motion.  Lymphadenopathy:     Cervical: No cervical adenopathy.  Skin:    General: Skin is warm and dry.     Findings: No rash.  Neurological:     Mental Status: He is alert and oriented to person, place, and time.  Psychiatric:        Mood and Affect: Mood normal.     Wt Readings from Last 3 Encounters:  11/09/19 183 lb  9.6 oz (83.3 kg)  11/09/19 183 lb 9.6 oz (83.3 kg)  10/12/19 187 lb (84.8 kg)    BP 98/68   Pulse 81   Temp 97.8 F (36.6 C) (Oral)   Ht 5\' 10"  (1.778 m)   Wt 183 lb 9.6 oz (83.3 kg)   SpO2 99%   BMI 26.34 kg/m   Assessment and Plan: 1. Type II diabetes mellitus with complication (HCC) Clinically stable by exam and report without s/s of hypoglycemia. DM complicated by lipids. Tolerating medications well without side effects or other concerns. Last A1C excellent.  2. Slow transit constipation Suspect this is the cause of RLQ abd fullness Begin MOM daily - 1/2-1 dose and adjust to effect of 1-2 stools per day   Partially dictated using . Any errors are unintentional.  Animal nutritionist, MD [facility]  11/09/2019

## 2019-11-19 ENCOUNTER — Telehealth: Payer: Self-pay | Admitting: Internal Medicine

## 2019-11-19 NOTE — Telephone Encounter (Signed)
Copied from CRM [id number]. Topic: General - Call Back - No Documentation >> Nov 19, 2019 11:49 AM Randol Kern wrote: Reason for CRM: Cyndie Chime wants to know if they can fax over medical certificates Best contact: [phone]

## 2019-11-20 NOTE — Telephone Encounter (Signed)
Called [employer] back. She stated that everything has been handled. That she was able to reschedule and appt. for pt. Asked if there was anything else I could help her with her response was no thank you.  KP

## 2019-12-25 ENCOUNTER — Telehealth: Payer: Self-pay

## 2019-12-25 NOTE — Chronic Care Management (AMB) (Signed)
  Chronic Care Management   Note  12/25/2019 Name: Marc Bond MRN: [id number] DOB: 11-13-1945  Marc Bond is a 74 y.o. year old male who is a primary care patient of Glean Hess, MD. I reached out to Marc Bond by phone today in response to a referral sent by Marc Bond health plan.     Marc Bond was given information about Chronic Care Management services today including:  1. CCM service includes personalized support from designated clinical staff supervised by his physician, including individualized plan of care and coordination with other care providers 2. 24/7 contact phone numbers for assistance for urgent and routine care needs. 3. Service will only be billed when office clinical staff spend 20 minutes or more in a month to coordinate care. 4. Only one practitioner may furnish and bill the service in a calendar month. 5. The patient may stop CCM services at any time (effective at the end of the month) by phone call to the office staff. 6. The patient will be responsible for cost sharing (co-pay) of up to 20% of the service fee (after annual deductible is met).  Patient did not agree to enrollment in care management services and does not wish to consider at this time.  Follow up plan: The patient has been provided with contact information for the care management team and has been advised to call with any health related questions or concerns.   Noreene Larsson, [address], [place], [place] [address] Direct Dial: [phone] [email or web address] [email or web address] Website: [email or web address]

## 2020-01-01 LAB — HM DIABETES EYE EXAM

## 2020-01-07 ENCOUNTER — Other Ambulatory Visit: Payer: Self-pay

## 2020-01-07 ENCOUNTER — Ambulatory Visit (INDEPENDENT_AMBULATORY_CARE_PROVIDER_SITE_OTHER): Payer: Medicare Other

## 2020-01-07 DIAGNOSIS — Z23 Encounter for immunization: Secondary | ICD-10-CM

## 2020-01-14 DIAGNOSIS — M1711 Unilateral primary osteoarthritis, right knee: Secondary | ICD-10-CM | POA: Diagnosis not present

## 2020-01-16 ENCOUNTER — Other Ambulatory Visit: Payer: Self-pay | Admitting: Internal Medicine

## 2020-01-16 DIAGNOSIS — M5136 Other intervertebral disc degeneration, lumbar region: Secondary | ICD-10-CM

## 2020-01-16 NOTE — Telephone Encounter (Signed)
Requested Prescriptions  Pending Prescriptions Disp Refills  . celecoxib (CELEBREX) 200 MG capsule [Pharmacy Med Name: CELECOXIB 200 MG CAPSULE] 90 capsule 1    Sig: TAKE 1 CAPSULE BY MOUTH EVERY DAY     Analgesics:  COX2 Inhibitors Failed - 01/16/2020  8:27 AM      Failed - Cr in normal range and within 360 days    Creatinine, Ser  Date Value Ref Range Status  10/12/2019 1.74 (H) 0.76 - 1.27 mg/dL Final         Passed - HGB in normal range and within 360 days    Hemoglobin  Date Value Ref Range Status  05/11/2019 13.0 13.0 - 17.7 g/dL Final         Passed - Patient is not pregnant      Passed - Valid encounter within last 12 months    Recent Outpatient Visits          2 months ago Type II diabetes mellitus with complication [facility])   [facility] Reubin Milan, MD   3 months ago Degenerative disc disease, lumbar   [facility] Reubin Milan, MD   8 months ago Annual physical exam   [facility] Reubin Milan, MD   9 months ago Acute non-recurrent maxillary sinusitis   [facility] Reubin Milan, MD   1 year ago Type II diabetes mellitus with complication [facility])   [facility] Reubin Milan, MD      Future Appointments            In 3 weeks Judithann Graves Nyoka Cowden, MD [facility]   In 5 months Judithann Graves, Nyoka Cowden, MD [facility], [facility]

## 2020-02-09 DIAGNOSIS — Z23 Encounter for immunization: Secondary | ICD-10-CM | POA: Diagnosis not present

## 2020-02-10 ENCOUNTER — Ambulatory Visit (INDEPENDENT_AMBULATORY_CARE_PROVIDER_SITE_OTHER): Payer: Medicare Other | Admitting: Internal Medicine

## 2020-02-10 ENCOUNTER — Ambulatory Visit: Payer: Medicare Other | Admitting: Internal Medicine

## 2020-02-10 ENCOUNTER — Encounter: Payer: Self-pay | Admitting: Internal Medicine

## 2020-02-10 ENCOUNTER — Other Ambulatory Visit: Payer: Self-pay

## 2020-02-10 VITALS — BP 106/74 | HR 88 | Temp 97.8°F | Ht 70.0 in | Wt 191.0 lb

## 2020-02-10 DIAGNOSIS — N1832 Chronic kidney disease, stage 3b: Secondary | ICD-10-CM | POA: Diagnosis not present

## 2020-02-10 DIAGNOSIS — I10 Essential (primary) hypertension: Secondary | ICD-10-CM | POA: Diagnosis not present

## 2020-02-10 DIAGNOSIS — E118 Type 2 diabetes mellitus with unspecified complications: Secondary | ICD-10-CM | POA: Diagnosis not present

## 2020-02-10 MED ORDER — RAMIPRIL 2.5 MG PO CAPS: 2.5000 mg | ORAL_CAPSULE | Freq: Every day | ORAL | 3 refills | Status: DC

## 2020-02-10 NOTE — Progress Notes (Signed)
Date:  02/10/2020   Name:  Marc Bond   DOB:  01-05-1946   MRN:  [id number]   Chief Complaint: Diabetes (f/u - last blood sugar 166 this morning )  Diabetes He presents for his follow-up diabetic visit. He has type 2 diabetes mellitus. His disease course has been stable. Pertinent negatives for hypoglycemia include no headaches or tremors. Pertinent negatives for diabetes include no chest pain, no fatigue, no polydipsia and no polyuria. Symptoms are stable. Current diabetic treatment includes oral agent (dual therapy) (janumet). He is compliant with treatment all of the time. His weight is stable. There is no change in his home blood glucose trend. His breakfast blood glucose is taken between 6-7 am. His breakfast blood glucose range is generally 110-130 mg/dl. His dinner blood glucose is taken between 5-6 pm. His dinner blood glucose range is generally 110-130 mg/dl. An ACE inhibitor/angiotensin II receptor blocker is being taken. Eye exam is current.    Lab Results  Component Value Date   CREATININE 1.74 (H) 10/12/2019   BUN 25 10/12/2019   NA 138 10/12/2019   K 4.6 10/12/2019   CL 103 10/12/2019   CO2 19 (L) 10/12/2019   Lab Results  Component Value Date   CHOL 101 05/11/2019   HDL 44 05/11/2019   LDLCALC 42 05/11/2019   TRIG 74 05/11/2019   CHOLHDL 2.3 05/11/2019   Lab Results  Component Value Date   TSH 1.30 09/29/2017   Lab Results  Component Value Date   HGBA1C 7.0 (H) 10/12/2019   Lab Results  Component Value Date   WBC 9.3 05/11/2019   HGB 13.0 05/11/2019   HCT 37.4 (L) 05/11/2019   MCV 82 05/11/2019   PLT 205 05/11/2019   Lab Results  Component Value Date   ALT 16 10/12/2019   AST 22 10/12/2019   ALKPHOS 69 10/12/2019   BILITOT 0.4 10/12/2019     Review of Systems  Constitutional: Negative for appetite change, fatigue and unexpected weight change.  Eyes: Negative for visual disturbance.  Respiratory: Negative for cough, shortness of breath and  wheezing.   Cardiovascular: Negative for chest pain, palpitations and leg swelling.  Gastrointestinal: Negative for abdominal pain and blood in stool.  Endocrine: Negative for polydipsia and polyuria.  Genitourinary: Negative for dysuria and hematuria.  Musculoskeletal: Positive for arthralgias (knees - injection recently; takes celebrex daily).  Skin: Negative for color change and rash.  Neurological: Negative for tremors, numbness and headaches.  Psychiatric/Behavioral: Negative for dysphoric mood.    Patient Active Problem List   Diagnosis Date Noted  . CKD (chronic kidney disease) stage 3, GFR 30-59 ml/min (HCC) 10/13/2019  . 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 Renal cyst, right 10/29/2016  . Pulmonary cavitary lesion 10/16/2016  . 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 Degenerative disc disease, lumbar 09/29/2015  . Primary osteoarthritis of right knee 09/29/2015  . Irritable colon 03/12/2015  . Type II diabetes mellitus with complication (HCC) 03/12/2015  . Hyperlipidemia associated with type 2 diabetes mellitus (HCC) 03/12/2015  . Enlarged prostate 03/12/2015  . Failure of erection 03/12/2015  . Seborrhea capitis 03/12/2015  . Peptic ulcer disease 03/12/2015  . Acquired deformities of toe 03/03/2012  . Bilateral cataracts 12/10/2011    Allergies  Allergen Reactions  . Ace Inhibitors Other (See Comments)    Sx hypotension on low dose    Past Surgical History:  Procedure Laterality Date  . COLONOSCOPY  11/2011   normal - 10 yr follow  up Dr. Earlean Polka  . PROSTATE SURGERY      Social History   Tobacco Use  . Smoking status: Former Smoker    Packs/day: 0.00    Years: 13.00    Pack years: 0.00    Types: Cigarettes, Pipe, Cigars    Quit date: 1980    Years since quitting: 41.8  . Smokeless tobacco: Never Used  . Tobacco comment: I quit smoking about 40 years ago.  Vaping Use  . Vaping Use: Never used  Substance Use Topics  . Alcohol use: No     Alcohol/week: 0.0 standard drinks  . Drug use: Not Currently     Medication list has been reviewed and updated.  Current Meds  Medication Sig  . alfuzosin (UROXATRAL) 10 MG 24 hr tablet Take 10 mg by mouth daily.  Marland Kitchen aspirin 81 MG chewable tablet Chew by mouth daily.  . celecoxib (CELEBREX) 200 MG capsule TAKE 1 CAPSULE BY MOUTH EVERY DAY  . cholecalciferol (VITAMIN D) 1000 units tablet Take 1,000 Units by mouth daily.  Marland Kitchen CIALIS 20 MG tablet take 1 tablet by mouth once daily if needed  . doxycycline (VIBRA-TABS) 100 MG tablet doxycycline hyclate 100 mg tablet  . glucose blood (ONETOUCH VERIO) test strip ONETOUCH VERIO (In Vitro Strip) - Historical Medication  1 daily Active  . glucose blood test strip OneTouch Verio test strips  BY XX ROUTE 2 TIMES DAILY USE AS INSTRUCTED  . MULTIPLE VITAMINS PO Take 1 tablet by mouth daily.  Marland Kitchen omeprazole (PRILOSEC) 40 MG capsule TAKE 1 CAPSULE BY MOUTH EVERY DAY  . ramipril (ALTACE) 2.5 MG capsule TAKE 1 CAPSULE(2.5 MG) BY MOUTH DAILY  . rosuvastatin (CRESTOR) 40 MG tablet TAKE 1 TABLET(40 MG) BY MOUTH DAILY  . sitaGLIPtin-metformin (JANUMET) 50-1000 MG tablet Take 1 tablet by mouth 2 (two) times daily with a meal.    PHQ 2/9 Scores 02/10/2020 11/09/2019 10/12/2019 03/25/2019  PHQ - 2 Score 0 0 0 0  PHQ- 9 Score 0 - 0 0    GAD 7 : Generalized Anxiety Score 02/10/2020 10/12/2019  Nervous, Anxious, on Edge 0 0  Control/stop worrying 0 0  Worry too much - different things 0 0  Trouble relaxing 0 0  Restless 0 0  Easily annoyed or irritable 0 0  Afraid - awful might happen 0 0  Total GAD 7 Score 0 0  Anxiety Difficulty Not difficult at all Not difficult at all    BP Readings from Last 3 Encounters:  02/10/20 106/74  11/09/19 98/68  11/09/19 100/68    Physical Exam Vitals and nursing note reviewed.  Constitutional:      General: He is not in acute distress.    Appearance: Normal appearance. He is well-developed.  HENT:     Head:  Normocephalic and atraumatic.  Neck:     Vascular: No carotid bruit.  Cardiovascular:     Rate and Rhythm: Normal rate and regular rhythm.     Pulses: Normal pulses.     Heart sounds: No murmur heard.   Pulmonary:     Effort: Pulmonary effort is normal. No respiratory distress.     Breath sounds: No wheezing or rhonchi.  Musculoskeletal:     Cervical back: Normal range of motion.     Right lower leg: No edema.     Left lower leg: No edema.  Lymphadenopathy:     Cervical: No cervical adenopathy.  Skin:    General: Skin is warm and dry.  Findings: No rash.  Neurological:     General: No focal deficit present.     Mental Status: He is alert and oriented to person, place, and time.  Psychiatric:        Mood and Affect: Mood normal.        Behavior: Behavior normal.     Wt Readings from Last 3 Encounters:  02/10/20 191 lb (86.6 kg)  11/09/19 183 lb 9.6 oz (83.3 kg)  11/09/19 183 lb 9.6 oz (83.3 kg)    BP 106/74 (BP Location: Right Arm, Patient Position: Sitting)   Pulse 88   Temp 97.8 F (36.6 C) (Oral)   Ht 5\' 10"  (1.778 m)   Wt 191 lb (86.6 kg)   SpO2 98%   BMI 27.41 kg/m   Assessment and Plan: 1. Type II diabetes mellitus with complication (HCC) Clinically stable by exam and report without s/s of hypoglycemia. DM complicated by lipids and RI. Tolerating medications well without side effects or other concerns. He is no longer seeing Endo at [facility]. - Hemoglobin A1c  2. Stage 3b chronic kidney disease (HCC) Discussed need to stop nsaids.  He can use Tylenol May need to manage knee pain with injections Once GFR is below 40, I will refer to Nephrology - Basic metabolic panel - Phosphorus - VITAMIN D 25 Hydroxy (Vit-D Deficiency, Fractures) - PTH, Intact and Calcium  3. Essential hypertension Clinically stable exam with well controlled BP on ramipril. Tolerating medications without side effects at this time. Pt to continue current regimen and low sodium diet;  benefits of regular exercise as able discussed. - ramipril (ALTACE) 2.5 MG capsule; Take 1 capsule (2.5 mg total) by mouth daily.  Dispense: 90 capsule; Refill: 3   Partially dictated using [facility]. Any errors are unintentional.  Animal nutritionist, MD [facility]  02/10/2020

## 2020-02-11 LAB — BASIC METABOLIC PANEL
BUN/Creatinine Ratio: 12 (ref 10–24)
BUN: 20 mg/dL (ref 8–27)
CO2: 21 mmol/L (ref 20–29)
Calcium: 10.2 mg/dL (ref 8.6–10.2)
Chloride: 103 mmol/L (ref 96–106)
Creatinine, Ser: 1.65 mg/dL — ABNORMAL HIGH (ref 0.76–1.27)
GFR calc Af Amer: 46 mL/min/{1.73_m2} — ABNORMAL LOW (ref >59)
GFR calc non Af Amer: 40 mL/min/{1.73_m2} — ABNORMAL LOW (ref >59)
Glucose: 112 mg/dL — ABNORMAL HIGH (ref 65–99)
Potassium: 5.3 mmol/L — ABNORMAL HIGH (ref 3.5–5.2)
Sodium: 138 mmol/L (ref 134–144)

## 2020-02-11 LAB — HEMOGLOBIN A1C
Est. average glucose Bld gHb Est-mCnc: 166 mg/dL
Hgb A1c MFr Bld: 7.4 % — ABNORMAL HIGH (ref 4.8–5.6)

## 2020-02-11 LAB — PTH, INTACT AND CALCIUM: PTH: 20 pg/mL (ref 15–65)

## 2020-02-11 LAB — PHOSPHORUS: Phosphorus: 3.5 mg/dL (ref 2.8–4.1)

## 2020-02-11 LAB — VITAMIN D 25 HYDROXY (VIT D DEFICIENCY, FRACTURES): Vit D, 25-Hydroxy: 65 ng/mL (ref 30.0–100.0)

## 2020-02-12 DIAGNOSIS — N400 Enlarged prostate without lower urinary tract symptoms: Secondary | ICD-10-CM | POA: Diagnosis not present

## 2020-02-12 DIAGNOSIS — N529 Male erectile dysfunction, unspecified: Secondary | ICD-10-CM | POA: Diagnosis not present

## 2020-03-02 DIAGNOSIS — E785 Hyperlipidemia, unspecified: Secondary | ICD-10-CM | POA: Diagnosis not present

## 2020-03-02 DIAGNOSIS — E1140 Type 2 diabetes mellitus with diabetic neuropathy, unspecified: Secondary | ICD-10-CM | POA: Diagnosis not present

## 2020-03-02 DIAGNOSIS — Z7984 Long term (current) use of oral hypoglycemic drugs: Secondary | ICD-10-CM | POA: Diagnosis not present

## 2020-03-02 DIAGNOSIS — Z79899 Other long term (current) drug therapy: Secondary | ICD-10-CM | POA: Diagnosis not present

## 2020-03-02 DIAGNOSIS — Z87891 Personal history of nicotine dependence: Secondary | ICD-10-CM | POA: Diagnosis not present

## 2020-03-02 DIAGNOSIS — E119 Type 2 diabetes mellitus without complications: Secondary | ICD-10-CM | POA: Diagnosis not present

## 2020-03-02 DIAGNOSIS — G6289 Other specified polyneuropathies: Secondary | ICD-10-CM | POA: Diagnosis not present

## 2020-03-02 DIAGNOSIS — R252 Cramp and spasm: Secondary | ICD-10-CM | POA: Diagnosis not present

## 2020-03-11 ENCOUNTER — Other Ambulatory Visit: Payer: Self-pay | Admitting: Internal Medicine

## 2020-03-11 DIAGNOSIS — I10 Essential (primary) hypertension: Secondary | ICD-10-CM

## 2020-05-12 ENCOUNTER — Encounter: Payer: Medicare Other | Admitting: Internal Medicine

## 2020-05-31 DIAGNOSIS — I440 Atrioventricular block, first degree: Secondary | ICD-10-CM | POA: Insufficient documentation

## 2020-06-06 ENCOUNTER — Ambulatory Visit: Payer: Self-pay | Admitting: *Deleted

## 2020-06-06 NOTE — Telephone Encounter (Signed)
He should stop the altace, drink fluids up to 80 oz per day.  Schedule follow up with me in about a week.  Okay to use a same day.

## 2020-06-06 NOTE — Telephone Encounter (Signed)
Called and spoke with patient. Scheduled Follow up for March 1st at 1120AM to follow up on stopping medication and low BP. He verbalized understanding.

## 2020-06-06 NOTE — Telephone Encounter (Signed)
Patient girlfriend called to report patient has noticed some lightheadedness when standing. Reports on Friday patient dizzy and reports dizziness noticed x 2 weeks. Today at 1207 B/P 86/59 prior to call. Patient reports he has not taken Altace x 2 days. Patient took B/P sitting for 108/60 HR 92 at 1224 and standing B/P 97/66 HR 103. Did not report dizziness. Denies chest pain, difficulty breathing, fever, nausea or vomiting. Patient is scheduled for appt with pulmonologist on Thursday and cardiology in March. Earliest appt with PCP is Friday and patient reports he is unable to schedule appt Friday 06/10/20. Instructed patient if his B/P systolic is less than 90 go to ED or call 911. Care advise given. Patient verbalized understanding of care advise and to call back or go to ED or call 911 is symptoms worsen.   Reason for Disposition . [1] Systolic BP 90-110 AND [2] taking blood pressure medications AND [3] dizzy, lightheaded or weak  Answer Assessment - Initial Assessment Questions 1. BLOOD PRESSURE: "What is the blood pressure?" "Did you take at least two measurements 5 minutes apart?"     1207 B/P 86/59 prior to call. Patient rechecked B/P at 1224 108/69 HR 92 sitting and then B/P 97/66 HR 103 standing.  2. ONSET: "When did you take your blood pressure?"     Now  3. HOW: "How did you obtain the blood pressure?" (e.g., visiting nurse, automatic home BP monitor)     Home BP montior 4. HISTORY: "Do you have a history of low blood pressure?" "What is your blood pressure normally?"     Na  5. MEDICATIONS: "Are you taking any medications for blood pressure?" If Yes, ask: "Have they been changed recently?"     Altace 6. PULSE RATE: "Do you know what your pulse rate is?"      92-103 bpm 7. OTHER SYMPTOMS: "Have you been sick recently?" "Have you had a recent injury?"     No  8. PREGNANCY: "Is there any chance you are pregnant?" "When was your last menstrual period?"     na  Protocols used: BLOOD  PRESSURE - LOW-A-AH

## 2020-06-14 ENCOUNTER — Encounter: Payer: Self-pay | Admitting: Internal Medicine

## 2020-06-14 ENCOUNTER — Other Ambulatory Visit: Payer: Self-pay

## 2020-06-14 ENCOUNTER — Ambulatory Visit (INDEPENDENT_AMBULATORY_CARE_PROVIDER_SITE_OTHER): Payer: Medicare Other | Admitting: Internal Medicine

## 2020-06-14 VITALS — BP 110/70 | HR 92 | Temp 97.5°F | Ht 70.0 in | Wt 186.0 lb

## 2020-06-14 DIAGNOSIS — N1832 Chronic kidney disease, stage 3b: Secondary | ICD-10-CM | POA: Diagnosis not present

## 2020-06-14 DIAGNOSIS — I952 Hypotension due to drugs: Secondary | ICD-10-CM

## 2020-06-14 DIAGNOSIS — E118 Type 2 diabetes mellitus with unspecified complications: Secondary | ICD-10-CM

## 2020-06-14 NOTE — Progress Notes (Signed)
Date:  06/14/2020   Name:  Marc Bond   DOB:  20-Feb-1946   MRN:  [id number]   Chief Complaint: Hypotension (Stopped ramipril, hasnt taken for 2 weeks)  Low BP - patient has been taking low dose ACE for renal protection in DM.  However he developed low BP and dizziness which almost caused several falls.  He stopped the medication several weeks ago and is feeling much better.  He also stopped his prostate med Uroxatrol.  Diabetes He presents for his follow-up diabetic visit. He has type 2 diabetes mellitus. His disease course has been stable. Pertinent negatives for hypoglycemia include no dizziness or headaches. Pertinent negatives for diabetes include no chest pain and no fatigue.    Lab Results  Component Value Date   CREATININE 1.65 (H) 02/10/2020   BUN 20 02/10/2020   NA 138 02/10/2020   K 5.3 (H) 02/10/2020   CL 103 02/10/2020   CO2 21 02/10/2020   Lab Results  Component Value Date   CHOL 101 05/11/2019   HDL 44 05/11/2019   LDLCALC 42 05/11/2019   TRIG 74 05/11/2019   CHOLHDL 2.3 05/11/2019   Lab Results  Component Value Date   TSH 1.30 09/29/2017   Lab Results  Component Value Date   HGBA1C 7.4 (H) 02/10/2020   Lab Results  Component Value Date   WBC 9.3 05/11/2019   HGB 13.0 05/11/2019   HCT 37.4 (L) 05/11/2019   MCV 82 05/11/2019   PLT 205 05/11/2019   Lab Results  Component Value Date   ALT 16 10/12/2019   AST 22 10/12/2019   ALKPHOS 69 10/12/2019   BILITOT 0.4 10/12/2019     Review of Systems  Constitutional: Negative for chills, fatigue and fever.  Respiratory: Negative for chest tightness and shortness of breath.   Cardiovascular: Negative for chest pain and palpitations.  Neurological: Positive for light-headedness (resolved). Negative for dizziness, syncope and headaches.    Patient Active Problem List   Diagnosis Date Noted  . 1st degree AV block 05/31/2020  . CKD (chronic kidney disease) stage 3, GFR 30-59 ml/min (HCC) 10/13/2019   . 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 Renal cyst, right 10/29/2016  . Pulmonary cavitary lesion 10/16/2016  . 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 Degenerative disc disease, lumbar 09/29/2015  . Primary osteoarthritis of right knee 09/29/2015  . Irritable colon 03/12/2015  . Type II diabetes mellitus with complication (HCC) 03/12/2015  . Hyperlipidemia associated with type 2 diabetes mellitus (HCC) 03/12/2015  . Enlarged prostate 03/12/2015  . Failure of erection 03/12/2015  . Seborrhea capitis 03/12/2015  . Peptic ulcer disease 03/12/2015  . Acquired deformities of toe 03/03/2012  . Bilateral cataracts 12/10/2011    Allergies  Allergen Reactions  . Ace Inhibitors Other (See Comments)    Sx hypotension on low dose    Past Surgical History:  Procedure Laterality Date  . COLONOSCOPY  11/2011   normal - 10 yr follow up Dr. Earlean Polka  . PROSTATE SURGERY      Social History   Tobacco Use  . Smoking status: Former Smoker    Packs/day: 0.00    Years: 13.00    Pack years: 0.00    Types: Cigarettes, Pipe, Cigars    Quit date: 1980    Years since quitting: 42.1  . Smokeless tobacco: Never Used  . Tobacco comment: I quit smoking about 40 years ago.  Vaping Use  . Vaping Use: Never used  Substance Use Topics  .  Alcohol use: No    Alcohol/week: 0.0 standard drinks  . Drug use: Not Currently     Medication list has been reviewed and updated.  Current Meds  Medication Sig  . aspirin 81 MG chewable tablet Chew by mouth daily.  . cholecalciferol (VITAMIN D) 1000 units tablet Take 1,000 Units by mouth daily.  Marland Kitchen doxycycline (VIBRA-TABS) 100 MG tablet doxycycline hyclate 100 mg tablet  . glucose blood test strip ONETOUCH VERIO (In Vitro Strip) - Historical Medication  1 daily Active  . glucose blood test strip OneTouch Verio test strips  BY XX ROUTE 2 TIMES DAILY USE AS INSTRUCTED  . omeprazole (PRILOSEC) 40 MG capsule TAKE 1 CAPSULE BY MOUTH EVERY DAY  .  rosuvastatin (CRESTOR) 40 MG tablet TAKE 1 TABLET(40 MG) BY MOUTH DAILY  . sitaGLIPtin-metformin (JANUMET) 50-1000 MG tablet Take 1 tablet by mouth 2 (two) times daily with a meal.    PHQ 2/9 Scores 06/14/2020 02/10/2020 11/09/2019 10/12/2019  PHQ - 2 Score 0 0 0 0  PHQ- 9 Score 0 0 - 0    GAD 7 : Generalized Anxiety Score 06/14/2020 02/10/2020 10/12/2019  Nervous, Anxious, on Edge 0 0 0  Control/stop worrying 0 0 0  Worry too much - different things 0 0 0  Trouble relaxing 0 0 0  Restless 0 0 0  Easily annoyed or irritable 0 0 0  Afraid - awful might happen 0 0 0  Total GAD 7 Score 0 0 0  Anxiety Difficulty - Not difficult at all Not difficult at all    BP Readings from Last 3 Encounters:  06/14/20 110/70  02/10/20 106/74  11/09/19 98/68    Physical Exam Constitutional:      Appearance: Normal appearance.  Cardiovascular:     Rate and Rhythm: Normal rate and regular rhythm.     Pulses: Normal pulses.     Heart sounds: No murmur heard.   Pulmonary:     Effort: Pulmonary effort is normal.     Breath sounds: No wheezing or rhonchi.  Musculoskeletal:     Cervical back: Normal range of motion.     Right lower leg: No edema.     Left lower leg: No edema.  Lymphadenopathy:     Cervical: No cervical adenopathy.  Neurological:     General: No focal deficit present.     Mental Status: He is alert.  Psychiatric:        Mood and Affect: Mood normal.        Behavior: Behavior normal.     Wt Readings from Last 3 Encounters:  06/14/20 186 lb (84.4 kg)  02/10/20 191 lb (86.6 kg)  11/09/19 183 lb 9.6 oz (83.3 kg)    BP 110/70   Pulse 92   Temp (!) 97.5 F (36.4 C) (Oral)   Ht 5\' 10"  (1.778 m)   Wt 186 lb (84.4 kg)   SpO2 98%   BMI 26.69 kg/m   Assessment and Plan: 1. Hypotension due to medication Resolved after stopping ACE and uroxatrol  2. Type II diabetes mellitus with complication (HCC) Stable, follow up next month  3. Stage 3b chronic kidney disease  (HCC) stable   Partially dictated using . Any errors are unintentional.  Animal nutritionist, MD [facility]  06/14/2020

## 2020-06-15 ENCOUNTER — Encounter: Payer: Medicare Other | Admitting: Internal Medicine

## 2020-06-28 LAB — HEMOGLOBIN A1C: Hemoglobin A1C: 7.4

## 2020-06-29 ENCOUNTER — Other Ambulatory Visit: Payer: Self-pay | Admitting: Internal Medicine

## 2020-06-29 DIAGNOSIS — E785 Hyperlipidemia, unspecified: Secondary | ICD-10-CM

## 2020-07-13 ENCOUNTER — Other Ambulatory Visit: Payer: Self-pay

## 2020-07-13 ENCOUNTER — Encounter: Payer: Self-pay | Admitting: Internal Medicine

## 2020-07-13 ENCOUNTER — Ambulatory Visit (INDEPENDENT_AMBULATORY_CARE_PROVIDER_SITE_OTHER): Payer: Medicare Other | Admitting: Internal Medicine

## 2020-07-13 VITALS — BP 112/86 | HR 84 | Temp 97.7°F | Ht 70.0 in | Wt 181.0 lb

## 2020-07-13 DIAGNOSIS — E1169 Type 2 diabetes mellitus with other specified complication: Secondary | ICD-10-CM

## 2020-07-13 DIAGNOSIS — N138 Other obstructive and reflux uropathy: Secondary | ICD-10-CM

## 2020-07-13 DIAGNOSIS — E785 Hyperlipidemia, unspecified: Secondary | ICD-10-CM

## 2020-07-13 DIAGNOSIS — N1832 Chronic kidney disease, stage 3b: Secondary | ICD-10-CM | POA: Diagnosis not present

## 2020-07-13 DIAGNOSIS — K279 Peptic ulcer, site unspecified, unspecified as acute or chronic, without hemorrhage or perforation: Secondary | ICD-10-CM | POA: Diagnosis not present

## 2020-07-13 DIAGNOSIS — I440 Atrioventricular block, first degree: Secondary | ICD-10-CM

## 2020-07-13 DIAGNOSIS — E118 Type 2 diabetes mellitus with unspecified complications: Secondary | ICD-10-CM

## 2020-07-13 DIAGNOSIS — N401 Enlarged prostate with lower urinary tract symptoms: Secondary | ICD-10-CM

## 2020-07-13 LAB — POCT UA - MICROALBUMIN: Microalbumin Ur, POC: 20 mg/L

## 2020-07-13 MED ORDER — OMEPRAZOLE 40 MG PO CPDR: DELAYED_RELEASE_CAPSULE | ORAL | 3 refills | Status: DC

## 2020-07-13 NOTE — Progress Notes (Signed)
Date:  07/13/2020   Name:  Marc Bond   DOB:  [birth year]   MRN:  [id number]   Chief Complaint: Annual Exam (Foot exam, micro )  Marc Bond is a 75 y.o. male who presents today for his Complete Annual Exam. He feels well. He reports exercising walks 2 miles per day . He reports he is sleeping poorly.   Colonoscopy: 11/2011  Immunization History  Administered Date(s) Administered  . Fluad Quad(high Dose 65+) 12/30/2018, 01/07/2020  . Influenza, High Dose Seasonal PF 12/19/2017  . Influenza,inj,Quad PF,6+ Mos 12/26/2016  . Influenza-Unspecified 02/16/2015, 12/26/2016  . Moderna Sars-Covid-2 Vaccination 05/09/2019, 06/09/2019, 02/09/2020  . Pneumococcal Conjugate-13 09/29/2015, 03/27/2017  . Pneumococcal Polysaccharide-23 04/17/2008, 12/04/2012  . Tdap 10/24/2017  . Zoster Recombinat (Shingrix) 01/22/2018, 03/25/2018    Diabetes He presents for his follow-up (followed by Endo) diabetic visit. He has type 2 diabetes mellitus. His disease course has been stable. Pertinent negatives for hypoglycemia include no dizziness or headaches. Pertinent negatives for diabetes include no chest pain and no fatigue. Current diabetic treatment includes oral agent (dual therapy). He is compliant with treatment all of the time. An ACE inhibitor/angiotensin II receptor blocker is being taken.  Hyperlipidemia This is a chronic problem. The problem is controlled. Pertinent negatives include no chest pain, myalgias or shortness of breath. Current antihyperlipidemic treatment includes statins. The current treatment provides significant improvement of lipids.  Gastroesophageal Reflux He complains of heartburn. He reports no abdominal pain, no chest pain, no choking or no wheezing. This is a recurrent problem. The problem occurs rarely. The heartburn does not wake him from sleep. Pertinent negatives include no fatigue.  Benign Prostatic Hypertrophy This is a chronic problem. The problem is unchanged. Irritative  symptoms do not include frequency. Pertinent negatives include no chills or dysuria. Past treatments include finasteride. The treatment provided mild relief.    Lab Results  Component Value Date   CREATININE 1.65 (H) 02/10/2020   BUN 20 02/10/2020   NA 138 02/10/2020   K 5.3 (H) 02/10/2020   CL 103 02/10/2020   CO2 21 02/10/2020   Lab Results  Component Value Date   CHOL 101 05/11/2019   HDL 44 05/11/2019   LDLCALC 42 05/11/2019   TRIG 74 05/11/2019   CHOLHDL 2.3 05/11/2019   Lab Results  Component Value Date   TSH 1.30 09/29/2017   Lab Results  Component Value Date   HGBA1C 7.4 06/28/2020   Lab Results  Component Value Date   WBC 9.3 05/11/2019   HGB 13.0 05/11/2019   HCT 37.4 (L) 05/11/2019   MCV 82 05/11/2019   PLT 205 05/11/2019   Lab Results  Component Value Date   ALT 16 10/12/2019   AST 22 10/12/2019   ALKPHOS 69 10/12/2019   BILITOT 0.4 10/12/2019     Review of Systems  Constitutional: Negative for appetite change, chills, diaphoresis, fatigue and unexpected weight change.  HENT: Negative for hearing loss, tinnitus, trouble swallowing and voice change.   Eyes: Negative for visual disturbance.  Respiratory: Negative for choking, shortness of breath and wheezing.   Cardiovascular: Negative for chest pain, palpitations and leg swelling.  Gastrointestinal: Positive for heartburn. Negative for abdominal pain, blood in stool, constipation and diarrhea.  Genitourinary: Negative for difficulty urinating, dysuria and frequency.  Musculoskeletal: Negative for arthralgias, back pain and myalgias.  Skin: Negative for color change and rash.  Neurological: Negative for dizziness, syncope and headaches.  Hematological: Negative for adenopathy.  Psychiatric/Behavioral: Negative for dysphoric  mood and sleep disturbance.    Patient Active Problem List   Diagnosis Date Noted  . 1st degree AV block 05/31/2020  . CKD (chronic kidney disease) stage 3, GFR 30-59 ml/min  (HCC) 10/13/2019  . 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 Renal cyst, right 10/29/2016  . Pulmonary cavitary lesion 10/16/2016  . 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 Degenerative disc disease, lumbar 09/29/2015  . Primary osteoarthritis of right knee 09/29/2015  . Irritable colon 03/12/2015  . Type II diabetes mellitus with complication (HCC) 03/12/2015  . Hyperlipidemia associated with type 2 diabetes mellitus (HCC) 03/12/2015  . Enlarged prostate 03/12/2015  . Failure of erection 03/12/2015  . Seborrhea capitis 03/12/2015  . Peptic ulcer disease 03/12/2015  . Acquired deformities of toe 03/03/2012  . Bilateral cataracts 12/10/2011    Allergies  Allergen Reactions  . Ace Inhibitors Other (See Comments)    Sx hypotension on low dose    Past Surgical History:  Procedure Laterality Date  . COLONOSCOPY  11/2011   normal - 10 yr follow up Dr. Earlean Polka  . PROSTATE SURGERY      Social History   Tobacco Use  . Smoking status: Former Smoker    Packs/day: 0.00    Years: 13.00    Pack years: 0.00    Types: Cigarettes, Pipe, Cigars    Quit date: 1980    Years since quitting: 42.2  . Smokeless tobacco: Never Used  . Tobacco comment: I quit smoking about 40 years ago.  Vaping Use  . Vaping Use: Never used  Substance Use Topics  . Alcohol use: No    Alcohol/week: 0.0 standard drinks  . Drug use: Not Currently     Medication list has been reviewed and updated.  Current Meds  Medication Sig  . aspirin 81 MG chewable tablet Chew by mouth daily.  . cholecalciferol (VITAMIN D) 1000 units tablet Take 1,000 Units by mouth daily.  . finasteride (PROSCAR) 5 MG tablet Take 5 mg by mouth daily.  Marland Kitchen glucose blood test strip ONETOUCH VERIO (In Vitro Strip) - Historical Medication  1 daily Active  . glucose blood test strip OneTouch Verio test strips  BY XX ROUTE 2 TIMES DAILY USE AS INSTRUCTED  . Lancets (ONETOUCH DELICA PLUS LANCET33G) MISC Apply topically 2 (two) times  daily.  Marland Kitchen omeprazole (PRILOSEC) 40 MG capsule TAKE 1 CAPSULE BY MOUTH EVERY DAY  . rosuvastatin (CRESTOR) 40 MG tablet TAKE 1 TABLET(40 MG) BY MOUTH DAILY  . sitaGLIPtin-metformin (JANUMET) 50-1000 MG tablet Take 1 tablet by mouth 2 (two) times daily with a meal.    PHQ 2/9 Scores 07/13/2020 06/14/2020 02/10/2020 11/09/2019  PHQ - 2 Score 0 0 0 0  PHQ- 9 Score 0 0 0 -    GAD 7 : Generalized Anxiety Score 07/13/2020 06/14/2020 02/10/2020 10/12/2019  Nervous, Anxious, on Edge 0 0 0 0  Control/stop worrying 0 0 0 0  Worry too much - different things 0 0 0 0  Trouble relaxing 0 0 0 0  Restless 0 0 0 0  Easily annoyed or irritable 0 0 0 0  Afraid - awful might happen 0 0 0 0  Total GAD 7 Score 0 0 0 0  Anxiety Difficulty - - Not difficult at all Not difficult at all    BP Readings from Last 3 Encounters:  07/13/20 112/86  06/14/20 110/70  02/10/20 106/74    Physical Exam Vitals and nursing note reviewed.  Constitutional:  Appearance: Normal appearance. He is well-developed.  HENT:     Head: Normocephalic.     Right Ear: Tympanic membrane, ear canal and external ear normal.     Left Ear: Tympanic membrane, ear canal and external ear normal.     Nose: Nose normal.  Eyes:     Conjunctiva/sclera: Conjunctivae normal.     Pupils: Pupils are equal, round, and reactive to light.  Neck:     Thyroid: No thyromegaly.     Vascular: No carotid bruit.  Cardiovascular:     Rate and Rhythm: Normal rate and regular rhythm.     Heart sounds: Normal heart sounds.  Pulmonary:     Effort: Pulmonary effort is normal.     Breath sounds: Normal breath sounds. No wheezing.  Chest:  Breasts:     Right: No mass.     Left: No mass.    Abdominal:     General: Bowel sounds are normal.     Palpations: Abdomen is soft.     Tenderness: There is no abdominal tenderness.  Musculoskeletal:        General: Normal range of motion.     Cervical back: Normal range of motion and neck supple.   Lymphadenopathy:     Cervical: No cervical adenopathy.  Skin:    General: Skin is warm and dry.  Neurological:     Mental Status: He is alert and oriented to person, place, and time.     Deep Tendon Reflexes: Reflexes are normal and symmetric.  Psychiatric:        Attention and Perception: Attention normal.        Mood and Affect: Mood normal.        Thought Content: Thought content normal.     Wt Readings from Last 3 Encounters:  07/13/20 181 lb (82.1 kg)  06/14/20 186 lb (84.4 kg)  02/10/20 191 lb (86.6 kg)    BP 112/86   Pulse 84   Temp 97.7 F (36.5 C) (Oral)   Ht 5\' 10"  (1.778 m)   Wt 181 lb (82.1 kg)   SpO2 97%   BMI 25.97 kg/m   Assessment and Plan: 1. Hyperlipidemia associated with type 2 diabetes mellitus (HCC) Tolerating statin medication without side effects at this time LDL is at goal of < 70 on current dose Continue same therapy without change at this time. - Lipid panel  2. Stage 3b chronic kidney disease (HCC) Monitoring Avoid NSAIDS - Comprehensive metabolic panel  3. Type II diabetes mellitus with complication (HCC) Being followed by Endo Unable to tolerate even low dose ACE due to hypotension Recent A1C 7.4 Eye exam up to date Foot exam normal - Comprehensive metabolic panel - POCT UA - Microalbumin  4. Peptic ulcer disease Symptoms well controlled on daily PPI No red flag signs such as weight loss, n/v, melena Will continue omeprazole - CBC with Differential/Platelet - omeprazole (PRILOSEC) 40 MG capsule; TAKE 1 CAPSULE BY MOUTH EVERY DAY  Dispense: 90 capsule; Refill: 3  5. 1st degree AV block Recent ECHO normal  6. BPH with obstruction/lower urinary tract symptoms Followed by Urology Uroxatrol stopped due to low BP Now on Proscar    Partially dictated using . Any errors are unintentional.  Animal nutritionist, MD [facility]  07/13/2020

## 2020-07-14 ENCOUNTER — Telehealth: Payer: Self-pay

## 2020-07-14 LAB — COMPREHENSIVE METABOLIC PANEL
ALT: 74 IU/L — ABNORMAL HIGH (ref 0–44)
AST: 56 IU/L — ABNORMAL HIGH (ref 0–40)
Albumin/Globulin Ratio: 1.3 (ref 1.2–2.2)
Albumin: 4.5 g/dL (ref 3.7–4.7)
Alkaline Phosphatase: 81 IU/L (ref 44–121)
BUN/Creatinine Ratio: 13 (ref 10–24)
BUN: 22 mg/dL (ref 8–27)
Bilirubin Total: 0.6 mg/dL (ref 0.0–1.2)
CO2: 16 mmol/L — ABNORMAL LOW (ref 20–29)
Calcium: 10.2 mg/dL (ref 8.6–10.2)
Chloride: 100 mmol/L (ref 96–106)
Creatinine, Ser: 1.67 mg/dL — ABNORMAL HIGH (ref 0.76–1.27)
Globulin, Total: 3.6 g/dL (ref 1.5–4.5)
Glucose: 107 mg/dL — ABNORMAL HIGH (ref 65–99)
Potassium: 5 mmol/L (ref 3.5–5.2)
Sodium: 136 mmol/L (ref 134–144)
Total Protein: 8.1 g/dL (ref 6.0–8.5)
eGFR: 42 mL/min/{1.73_m2} — ABNORMAL LOW (ref >59)

## 2020-07-14 LAB — LIPID PANEL
Chol/HDL Ratio: 2.7 ratio (ref 0.0–5.0)
Cholesterol, Total: 135 mg/dL (ref 100–199)
HDL: 50 mg/dL (ref >39)
LDL Chol Calc (NIH): 71 mg/dL (ref 0–99)
Triglycerides: 67 mg/dL (ref 0–149)
VLDL Cholesterol Cal: 14 mg/dL (ref 5–40)

## 2020-07-14 LAB — CBC WITH DIFFERENTIAL/PLATELET
Basophils Absolute: 0 10*3/uL (ref 0.0–0.2)
Basos: 0 %
EOS (ABSOLUTE): 0.1 10*3/uL (ref 0.0–0.4)
Eos: 1 %
Hematocrit: 39.8 % (ref 37.5–51.0)
Hemoglobin: 13.5 g/dL (ref 13.0–17.7)
Immature Grans (Abs): 0 10*3/uL (ref 0.0–0.1)
Immature Granulocytes: 0 %
Lymphocytes Absolute: 2.7 10*3/uL (ref 0.7–3.1)
Lymphs: 30 %
MCH: 27.1 pg (ref 26.6–33.0)
MCHC: 33.9 g/dL (ref 31.5–35.7)
MCV: 80 fL (ref 79–97)
Monocytes Absolute: 0.9 10*3/uL (ref 0.1–0.9)
Monocytes: 10 %
Neutrophils Absolute: 5.3 10*3/uL (ref 1.4–7.0)
Neutrophils: 59 %
Platelets: 236 10*3/uL (ref 150–450)
RBC: 4.99 x10E6/uL (ref 4.14–5.80)
RDW: 13.5 % (ref 11.6–15.4)
WBC: 8.9 10*3/uL (ref 3.4–10.8)

## 2020-07-14 NOTE — Telephone Encounter (Signed)
Please call pt to schedule an appt for lab only for liver function recheck.  KP

## 2020-07-18 ENCOUNTER — Ambulatory Visit: Payer: Medicare Other

## 2020-07-19 ENCOUNTER — Other Ambulatory Visit: Payer: Self-pay | Admitting: Internal Medicine

## 2020-07-19 DIAGNOSIS — M5136 Other intervertebral disc degeneration, lumbar region: Secondary | ICD-10-CM

## 2020-09-27 ENCOUNTER — Telehealth: Payer: Self-pay | Admitting: Internal Medicine

## 2020-09-27 ENCOUNTER — Telehealth: Payer: Self-pay

## 2020-09-27 NOTE — Telephone Encounter (Signed)
Medication Refill - Medication: Requesting an alternative to Celebrex, his arthritis is "getting the best of him now and it is affecting his mobility" -Transferred to triage   Caller is Suzzette Righter,  Best contact: [phone]  Has the patient contacted their pharmacy? Yes.   (Agent: If no, request that the patient contact the pharmacy for the refill.) (Agent: If yes, when and what did the pharmacy advise?)  Preferred Pharmacy (with phone number or street name):  [id number] Jeanice Lim, [place] - [address] AT [facility] [address] [place] [address]  [address] [place] [address]  Phone: [phone] Fax: [phone]    Agent: Please be advised that RX refills may take up to 3 business days. We ask that you follow-up with your pharmacy.

## 2020-09-27 NOTE — Telephone Encounter (Signed)
Dr. Judithann Graves cannot change medication for this until he is seen in person. Please call him or his GF Suzzette Righter and schedule a visit as soon as possible for arthritis/med change.

## 2020-09-27 NOTE — Telephone Encounter (Signed)
Patient needs to schedule a visit with Dr Judithann Graves. She will need to see patient before she can change medication for documentation purposes.   Sent message to front desk to schedule patient an appt asap.

## 2020-09-27 NOTE — Telephone Encounter (Signed)
Pt. Reports he continues to have arthritic pain in both hips and hands. States the Celebrex was stopped "because I had been on it a long time." Requesting PCP put him on another "arthritis medication." Please advise.

## 2020-09-28 ENCOUNTER — Ambulatory Visit
Admission: RE | Admit: 2020-09-28 | Discharge: 2020-09-28 | Disposition: A | Discharge status: Home / Self Care | Payer: Medicare Other | Attending: Internal Medicine | Admitting: Internal Medicine

## 2020-09-28 ENCOUNTER — Ambulatory Visit
Admission: RE | Admit: 2020-09-28 | Discharge: 2020-09-28 | Disposition: A | Discharge status: Home / Self Care | Payer: Medicare Other | Source: Ambulatory Visit | Attending: Internal Medicine | Admitting: Internal Medicine

## 2020-09-28 ENCOUNTER — Other Ambulatory Visit: Payer: Self-pay

## 2020-09-28 ENCOUNTER — Ambulatory Visit (INDEPENDENT_AMBULATORY_CARE_PROVIDER_SITE_OTHER): Payer: Medicare Other | Admitting: Internal Medicine

## 2020-09-28 ENCOUNTER — Encounter: Payer: Self-pay | Admitting: Internal Medicine

## 2020-09-28 VITALS — BP 112/70 | HR 90 | Temp 97.6°F | Ht 70.0 in | Wt 182.0 lb

## 2020-09-28 DIAGNOSIS — N1832 Chronic kidney disease, stage 3b: Secondary | ICD-10-CM | POA: Diagnosis not present

## 2020-09-28 DIAGNOSIS — M25552 Pain in left hip: Secondary | ICD-10-CM

## 2020-09-28 DIAGNOSIS — M180 Bilateral primary osteoarthritis of first carpometacarpal joints: Secondary | ICD-10-CM

## 2020-09-28 NOTE — Progress Notes (Signed)
Date:  09/28/2020   Name:  Marc Bond   DOB:  1945-08-10   MRN:  [id number]   Chief Complaint: Arthritis (Celebrex is no longer working like it used to. Pt complaining of arthritis giving him a harder time lately and wants to discuss changing to a diff medication.)  Hip Pain  There was no injury mechanism. The pain is present in the left hip and right hip. The pain has been Worsening since onset. Associated symptoms include an inability to bear weight. Associated symptoms comments: Worse after prolonged sitting. He has tried NSAIDs (stopped celebrex due to [place] -) for the symptoms. Improvement on treatment: has not tried taking tylenol.  Hand/thumb pain - he has stiffness and pain in his hand - mainly in both thumbs.  It is hard to write and twist off caps.  Lab Results  Component Value Date   CREATININE 1.67 (H) 07/13/2020   BUN 22 07/13/2020   NA 136 07/13/2020   K 5.0 07/13/2020   CL 100 07/13/2020   CO2 16 (L) 07/13/2020   Lab Results  Component Value Date   CHOL 135 07/13/2020   HDL 50 07/13/2020   LDLCALC 71 07/13/2020   TRIG 67 07/13/2020   CHOLHDL 2.7 07/13/2020   Lab Results  Component Value Date   TSH 1.30 09/29/2017   Lab Results  Component Value Date   HGBA1C 7.4 06/28/2020   Lab Results  Component Value Date   WBC 8.9 07/13/2020   HGB 13.5 07/13/2020   HCT 39.8 07/13/2020   MCV 80 07/13/2020   PLT 236 07/13/2020   Lab Results  Component Value Date   ALT 74 (H) 07/13/2020   AST 56 (H) 07/13/2020   ALKPHOS 81 07/13/2020   BILITOT 0.6 07/13/2020     Review of Systems  Constitutional:  Negative for chills, fatigue and fever.  Respiratory:  Negative for chest tightness and shortness of breath.   Cardiovascular:  Negative for chest pain.  Musculoskeletal:  Positive for arthralgias and back pain.   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 Earlean Polka   PROSTATE SURGERY      Social History   Tobacco Use   Smoking status: Former    Packs/day: 0.00    Years: 13.00    Pack years: 0.00    Types: Cigarettes, Pipe, Cigars    Quit date: 1980    Years since quitting: 42.4   Smokeless tobacco: Never   Tobacco comments:  I quit smoking about 40 years ago.  Vaping Use   Vaping Use: Never used  Substance Use Topics   Alcohol use: No    Alcohol/week: 0.0 standard drinks   Drug use: Not Currently     Medication list has been reviewed and updated.  Current Meds  Medication Sig   aspirin 81 MG chewable tablet Chew by mouth daily.   cholecalciferol (VITAMIN D) 1000 units tablet Take 1,000 Units by mouth daily.   finasteride (PROSCAR) 5 MG tablet Take 5 mg by mouth daily.   glucose blood test strip ONETOUCH VERIO (In Vitro Strip) - Historical Medication  1 daily Active   glucose blood test strip OneTouch Verio test strips  BY XX ROUTE 2 TIMES DAILY USE AS INSTRUCTED   Lancets (ONETOUCH DELICA PLUS LANCET33G) MISC Apply topically 2 (two) times daily.    omeprazole (PRILOSEC) 40 MG capsule TAKE 1 CAPSULE BY MOUTH EVERY DAY   rosuvastatin (CRESTOR) 40 MG tablet TAKE 1 TABLET(40 MG) BY MOUTH DAILY   sitaGLIPtin-metformin (JANUMET) 50-1000 MG tablet Take 1 tablet by mouth 2 (two) times daily with a meal.    PHQ 2/9 Scores 09/28/2020 07/13/2020 06/14/2020 02/10/2020  PHQ - 2 Score 3 0 0 0  PHQ- 9 Score 5 0 0 0    GAD 7 : Generalized Anxiety Score 09/28/2020 07/13/2020 06/14/2020 02/10/2020  Nervous, Anxious, on Edge 0 0 0 0  Control/stop worrying 0 0 0 0  Worry too much - different things 0 0 0 0  Trouble relaxing 0 0 0 0  Restless 0 0 0 0  Easily annoyed or irritable 0 0 0 0  Afraid - awful might happen 0 0 0 0  Total GAD 7 Score 0 0 0 0  Anxiety Difficulty - - - Not difficult at all    BP Readings from Last 3 Encounters:  09/28/20 112/70  07/13/20 112/86  06/14/20 110/70    Physical Exam Vitals and nursing note reviewed.  Constitutional:      General: He is not in acute distress.    Appearance: He is well-developed.  HENT:     Head: Normocephalic and atraumatic.  Cardiovascular:     Rate and Rhythm: Normal rate and regular rhythm.  Pulmonary:     Effort: Pulmonary effort is normal. No respiratory distress.     Breath sounds: No wheezing or rhonchi.  Musculoskeletal:     Right hand: Tenderness (base of thumb) present.     Left hand: Tenderness (base of thumb) present.     Right hip: No tenderness or bony tenderness. Normal range of motion.     Left hip: No tenderness or bony tenderness. Decreased range of motion.     Comments: Mild thenar atrophy bilaterally  Skin:    General: Skin is warm and dry.     Findings: No rash.  Neurological:     Mental Status: He is alert and oriented to person, place, and time.  Psychiatric:        Mood and Affect: Mood normal.        Behavior: Behavior normal.    Wt Readings from Last 3 Encounters:  09/28/20 182 lb (82.6 kg)  07/13/20 181 lb (82.1 kg)  06/14/20 186 lb (84.4 kg)    BP  112/70 (BP Location: Right Arm, Patient Position: Sitting, Cuff Size: Large)   Pulse 90   Temp 97.6 F (36.4 C) (Oral)   Ht 5\' 10"  (1.778 m)   Wt 182 lb (82.6 kg)  SpO2 98%   BMI 26.11 kg/m   Assessment and Plan: 1. Left hip pain Has been told years ago that he had OA.  Left hip appears to be worsening. Will image. Start Tylenol AS 650 mg tid - DG Hip Unilat W OR W/O Pelvis Min 4 Views Left; Future  2. Primary osteoarthritis of both first carpometacarpal joints Would benefit from Tylenol Also topical rubs can be helpful in this joint  3. Stage 3b chronic kidney disease (HCC) Continue to avoid all NSAIDS Most recent GFR was stable at 42   Partially dictated using [employer]. Any errors are unintentional.  Bari Edward, MD [facility]  09/28/2020

## 2020-09-28 NOTE — Patient Instructions (Addendum)
Tylenol AS 650 mg three times a day.  Voltaren gel or similar are good for thumb arthritis - use several times a day

## 2020-11-10 ENCOUNTER — Telehealth: Payer: Self-pay | Admitting: Internal Medicine

## 2020-11-10 NOTE — Telephone Encounter (Signed)
Copied from CRM [id number]. Topic: Medicare AWV >> Nov 10, 2020 12:17 PM Claudette Laws R wrote: Reason for CRM:  No answer unable to leave a message for patient to call back and schedule Medicare Annual Wellness Visit (AWV) in office.   If unable to come into the office for AWV,  please offer to do virtually or by telephone.  Last AWV:  11/09/2019  Please schedule at anytime with [facility] Health Advisor.  40 minute appointment  Any questions, please contact me at [phone]

## 2020-11-15 ENCOUNTER — Ambulatory Visit: Payer: Medicare Other

## 2020-11-15 ENCOUNTER — Other Ambulatory Visit: Payer: Self-pay

## 2020-11-15 DIAGNOSIS — R7989 Other specified abnormal findings of blood chemistry: Secondary | ICD-10-CM

## 2020-11-15 DIAGNOSIS — N1832 Chronic kidney disease, stage 3b: Secondary | ICD-10-CM

## 2020-11-16 LAB — COMPREHENSIVE METABOLIC PANEL
ALT: 25 IU/L (ref 0–44)
AST: 24 IU/L (ref 0–40)
Albumin/Globulin Ratio: 1.3 (ref 1.2–2.2)
Albumin: 4.4 g/dL (ref 3.7–4.7)
Alkaline Phosphatase: 73 IU/L (ref 44–121)
BUN/Creatinine Ratio: 13 (ref 10–24)
BUN: 21 mg/dL (ref 8–27)
Bilirubin Total: 0.4 mg/dL (ref 0.0–1.2)
CO2: 18 mmol/L — ABNORMAL LOW (ref 20–29)
Calcium: 10.3 mg/dL — ABNORMAL HIGH (ref 8.6–10.2)
Chloride: 100 mmol/L (ref 96–106)
Creatinine, Ser: 1.62 mg/dL — ABNORMAL HIGH (ref 0.76–1.27)
Globulin, Total: 3.4 g/dL (ref 1.5–4.5)
Glucose: 133 mg/dL — ABNORMAL HIGH (ref 65–99)
Potassium: 4.9 mmol/L (ref 3.5–5.2)
Sodium: 135 mmol/L (ref 134–144)
Total Protein: 7.8 g/dL (ref 6.0–8.5)
eGFR: 44 mL/min/{1.73_m2} — ABNORMAL LOW (ref >59)

## 2020-12-01 ENCOUNTER — Ambulatory Visit: Payer: Self-pay | Admitting: *Deleted

## 2020-12-01 NOTE — Telephone Encounter (Signed)
His girlfriend called in for him but he was on speaker phone with [place].   She is on the [facility].   Suzzette Righter.   He is c/o feeling like there is something in his right ear for the last 3 weeks.    Since he tested positive for Covid on Aug. 13, 2022 it will not allow me to schedule him an in office appt which he needs to have his ear examined.    I'm sending a note to Dr. Judithann Graves to see if he can be seen in the office.   Please call Rinaldo Cloud at [phone] to schedule him.     I have sent my notes to [facility] for Dr. Judithann Graves.

## 2020-12-01 NOTE — Telephone Encounter (Signed)
Scheduled pt for 8/23.  KP

## 2020-12-01 NOTE — Telephone Encounter (Signed)
Reason for Disposition . Earache  (Exceptions: brief ear pain of < 60 minutes duration, earache occurring during air travel    No earache just feels like something is in there for the last 3 weeks.  Answer Assessment - Initial Assessment Questions 1. DESCRIPTION: "Describe your dizziness."     Ear ache.   Feels like something is inside his right ear.  No pain.   Yes it itches.   No drainage.   He had Covid.    He had a cough only for 1 day.    Bothering him for 3 weeks.   No injuries or swimming.     2. LIGHTHEADED: "Do you feel lightheaded?" (e.g., somewhat faint, woozy, weak upon standing)     *No Answer* 3. VERTIGO: "Do you feel like either you or the room is spinning or tilting?" (i.e. vertigo)     *No Answer* 4. SEVERITY: "How bad is it?"  "Do you feel like you are going to faint?" "Can you stand and walk?"   - MILD: Feels slightly dizzy, but walking normally.   - MODERATE: Feels unsteady when walking, but not falling; interferes with normal activities (e.g., school, work).   - SEVERE: Unable to walk without falling, or requires assistance to walk without falling; feels like passing out now.      *No Answer* 5. ONSET:  "When did the dizziness begin?"     *No Answer* 6. AGGRAVATING FACTORS: "Does anything make it worse?" (e.g., standing, change in head position)     *No Answer* 7. HEART RATE: "Can you tell me your heart rate?" "How many beats in 15 seconds?"  (Note: not all patients can do this)       *No Answer* 8. CAUSE: "What do you think is causing the dizziness?"     *No Answer* 9. RECURRENT SYMPTOM: "Have you had dizziness before?" If Yes, ask: "When was the last time?" "What happened that time?"     *No Answer* 10. OTHER SYMPTOMS: "Do you have any other symptoms?" (e.g., fever, chest pain, vomiting, diarrhea, bleeding)       *No Answer* 11. PREGNANCY: "Is there any chance you are pregnant?" "When was your last menstrual period?"       *No Answer*  Answer Assessment -  Initial Assessment Questions 1. LOCATION: "Which ear is involved?"     Right ear feels like something is in his right ear. 2. ONSET: "When did the ear start hurting"      Had this feeling for 3 weeks 3. SEVERITY: "How bad is the pain?"  (Scale 1-10; mild, moderate or severe)   - MILD (1-3): doesn't interfere with normal activities    - MODERATE (4-7): interferes with normal activities or awakens from sleep    - SEVERE (8-10): excruciating pain, unable to do any normal activities      No pain, drainage.   It just itches 4. URI SYMPTOMS: "Do you have a runny nose or cough?"     No runny nose, coughing or sore throat.   He did test positive for Covid on 11/26/2020 only symptom was a cough for 1 day. 5. FEVER: "Do you have a fever?" If Yes, ask: "What is your temperature, how was it measured, and when did it start?"     No fever 6. CAUSE: "Have you been swimming recently?", "How often do you use Q-TIPS?", "Have you had any recent air travel or scuba diving?"     Not been swimming.   No injuries  Does not feel anything crawling in his ear. 7. OTHER SYMPTOMS: "Do you have any other symptoms?" (e.g., headache, stiff neck, dizziness, vomiting, runny nose, decreased hearing)     No 8. PREGNANCY: "Is there any chance you are pregnant?" "When was your last menstrual period?"     N/A  Protocols used: Dizziness - Karene Fry

## 2020-12-05 ENCOUNTER — Ambulatory Visit: Payer: Medicare Other | Admitting: Internal Medicine

## 2020-12-06 ENCOUNTER — Ambulatory Visit (INDEPENDENT_AMBULATORY_CARE_PROVIDER_SITE_OTHER): Payer: Medicare Other | Admitting: Internal Medicine

## 2020-12-06 ENCOUNTER — Other Ambulatory Visit: Payer: Self-pay

## 2020-12-06 ENCOUNTER — Encounter: Payer: Self-pay | Admitting: Internal Medicine

## 2020-12-06 VITALS — BP 122/86 | HR 88 | Temp 97.5°F | Ht 70.0 in | Wt 181.0 lb

## 2020-12-06 DIAGNOSIS — H6121 Impacted cerumen, right ear: Secondary | ICD-10-CM

## 2020-12-06 DIAGNOSIS — H60501 Unspecified acute noninfective otitis externa, right ear: Secondary | ICD-10-CM | POA: Diagnosis not present

## 2020-12-06 MED ORDER — NEOMYCIN-POLYMYXIN-HC 1 % OT SOLN: 3.0000 [drp] | Freq: Three times a day (TID) | OTIC | 0 refills | Status: DC

## 2020-12-06 NOTE — Progress Notes (Signed)
Date:  12/06/2020   Name:  Marc Bond   DOB:  Aug 18, 1945   MRN:  [id number]   Chief Complaint: Ear Pain (Right ear, dizziness, feels like something fluid is in his ear, not draining, was using sweet oil )  Otalgia  There is pain in the right ear. This is a new problem. The problem has been unchanged (sensation of fullnes and itching). There has been no fever. The patient is experiencing no pain. Pertinent negatives include no ear discharge, headaches, hearing loss or sore throat. He has tried ear drops for the symptoms. The treatment provided no relief (made the sensation of fullness worse).   Lab Results  Component Value Date   CREATININE 1.62 (H) 11/15/2020   BUN 21 11/15/2020   NA 135 11/15/2020   K 4.9 11/15/2020   CL 100 11/15/2020   CO2 18 (L) 11/15/2020   Lab Results  Component Value Date   CHOL 135 07/13/2020   HDL 50 07/13/2020   LDLCALC 71 07/13/2020   TRIG 67 07/13/2020   CHOLHDL 2.7 07/13/2020   Lab Results  Component Value Date   TSH 1.30 09/29/2017   Lab Results  Component Value Date   HGBA1C 7.4 06/28/2020   Lab Results  Component Value Date   WBC 8.9 07/13/2020   HGB 13.5 07/13/2020   HCT 39.8 07/13/2020   MCV 80 07/13/2020   PLT 236 07/13/2020   Lab Results  Component Value Date   ALT 25 11/15/2020   AST 24 11/15/2020   ALKPHOS 73 11/15/2020   BILITOT 0.4 11/15/2020     Review of Systems  Constitutional:  Negative for chills and fever.  HENT:  Negative for ear discharge, ear pain, hearing loss and sore throat.   Respiratory:  Negative for chest tightness and shortness of breath.   Cardiovascular:  Negative for chest pain.  Neurological:  Negative for dizziness and headaches.   Patient Active Problem List   Diagnosis Date Noted   1st degree AV block 05/31/2020   CKD (chronic kidney disease) stage 3, GFR 30-59 ml/min (HCC) 10/13/2019   Ulnar impingement syndrome, left 10/27/2018   Renal cyst, right 10/29/2016   Pulmonary cavitary  lesion 10/16/2016   PPD positive, treated 10/16/2016   Degenerative disc disease, lumbar 09/29/2015   Primary osteoarthritis of right knee 09/29/2015   Irritable colon 03/12/2015   Type II diabetes mellitus with complication (HCC) 03/12/2015   Hyperlipidemia associated with type 2 diabetes mellitus (HCC) 03/12/2015   BPH with obstruction/lower urinary tract symptoms 03/12/2015   Failure of erection 03/12/2015   Seborrhea capitis 03/12/2015   Peptic ulcer disease 03/12/2015   Acquired deformities of toe 03/03/2012   Bilateral cataracts 12/10/2011    Allergies  Allergen Reactions   Ace Inhibitors Other (See Comments)    Sx hypotension on low dose    Past Surgical History:  Procedure Laterality Date   COLONOSCOPY  11/2011   normal - 10 yr follow up Dr. Earlean Polka   PROSTATE SURGERY      Social History   Tobacco Use   Smoking status: Former    Packs/day: 0.00    Years: 13.00    Pack years: 0.00    Types: Cigarettes, Pipe, Cigars    Quit date: 1980    Years since quitting: 42.6   Smokeless tobacco: Never   Tobacco comments:    I quit smoking about 40 years ago.  Vaping Use   Vaping Use: Never used  Substance Use Topics  Alcohol use: No    Alcohol/week: 0.0 standard drinks   Drug use: Not Currently     Medication list has been reviewed and updated.  Current Meds  Medication Sig   alfuzosin (UROXATRAL) 10 MG 24 hr tablet Take 10 mg by mouth daily.   aspirin 81 MG chewable tablet Chew by mouth daily.   cholecalciferol (VITAMIN D) 1000 units tablet Take 1,000 Units by mouth daily.   finasteride (PROSCAR) 5 MG tablet Take 5 mg by mouth daily.   glucose blood test strip ONETOUCH VERIO (In Vitro Strip) - Historical Medication  1 daily Active   glucose blood test strip OneTouch Verio test strips  BY XX ROUTE 2 TIMES DAILY USE AS INSTRUCTED   Lancets (ONETOUCH DELICA PLUS LANCET33G) MISC Apply topically 2 (two) times daily.   omeprazole (PRILOSEC) 40 MG capsule TAKE 1  CAPSULE BY MOUTH EVERY DAY   rosuvastatin (CRESTOR) 40 MG tablet TAKE 1 TABLET(40 MG) BY MOUTH DAILY   sitaGLIPtin-metformin (JANUMET) 50-1000 MG tablet Take 1 tablet by mouth 2 (two) times daily with a meal.   [DISCONTINUED] ramipril (ALTACE) 2.5 MG capsule Take 2.5 mg by mouth daily.    PHQ 2/9 Scores 12/06/2020 09/28/2020 07/13/2020 06/14/2020  PHQ - 2 Score 0 3 0 0  PHQ- 9 Score 0 5 0 0    GAD 7 : Generalized Anxiety Score 12/06/2020 09/28/2020 07/13/2020 06/14/2020  Nervous, Anxious, on Edge 0 0 0 0  Control/stop worrying 0 0 0 0  Worry too much - different things 0 0 0 0  Trouble relaxing 0 0 0 0  Restless 0 0 0 0  Easily annoyed or irritable 0 0 0 0  Afraid - awful might happen 0 0 0 0  Total GAD 7 Score 0 0 0 0  Anxiety Difficulty - - - -    BP Readings from Last 3 Encounters:  12/06/20 122/86  09/28/20 112/70  07/13/20 112/86    Physical Exam Vitals and nursing note reviewed.  Constitutional:      General: He is not in acute distress.    Appearance: Normal appearance. He is well-developed.  HENT:     Head: Normocephalic and atraumatic.     Comments: Flushing of the right ear canal yielded a small amount of cerumen. Canal is now clear of obstruction - TM is yellow and cloudy in appearance    Right Ear: There is impacted cerumen.     Left Ear: Tympanic membrane and ear canal normal.  Cardiovascular:     Rate and Rhythm: Normal rate and regular rhythm.  Pulmonary:     Effort: Pulmonary effort is normal. No respiratory distress.     Breath sounds: No wheezing or rhonchi.  Skin:    General: Skin is warm and dry.     Findings: No rash.  Neurological:     Mental Status: He is alert and oriented to person, place, and time.  Psychiatric:        Mood and Affect: Mood normal.        Behavior: Behavior normal.    Wt Readings from Last 3 Encounters:  12/06/20 181 lb (82.1 kg)  09/28/20 182 lb (82.6 kg)  07/13/20 181 lb (82.1 kg)    BP 122/86   Pulse 88   Temp (!) 97.5  F (36.4 C) (Oral)   Ht 5\' 10"  (1.778 m)   Wt 181 lb (82.1 kg)   SpO2 99%   BMI 25.97 kg/m   Assessment and Plan:  1. Acute otitis externa of right ear, unspecified type Will treat with drops - if sx persist may need to see ENT - NEOMYCIN-POLYMYXIN-HYDROCORTISONE (CORTISPORIN) 1 % SOLN OTIC solution; Place 3 drops into the right ear every 8 (eight) hours.  Dispense: 10 mL; Refill: 0  2. Impacted cerumen of right ear Removed small amount of cerumen that was obstructing the view of the TM

## 2020-12-26 ENCOUNTER — Other Ambulatory Visit: Payer: Self-pay

## 2020-12-26 ENCOUNTER — Other Ambulatory Visit: Payer: Self-pay | Admitting: Internal Medicine

## 2020-12-26 ENCOUNTER — Ambulatory Visit (INDEPENDENT_AMBULATORY_CARE_PROVIDER_SITE_OTHER): Payer: Medicare Other

## 2020-12-26 VITALS — BP 120/80 | HR 77 | Temp 98.2°F | Resp 16 | Ht 70.0 in | Wt 184.2 lb

## 2020-12-26 DIAGNOSIS — Z23 Encounter for immunization: Secondary | ICD-10-CM | POA: Diagnosis not present

## 2020-12-26 DIAGNOSIS — Z0000 Encounter for general adult medical examination without abnormal findings: Secondary | ICD-10-CM | POA: Diagnosis not present

## 2020-12-26 DIAGNOSIS — I10 Essential (primary) hypertension: Secondary | ICD-10-CM

## 2020-12-26 NOTE — Patient Instructions (Signed)
[name] , Thank you for taking time to come for your Medicare Wellness Visit. I appreciate your ongoing commitment to your health goals. Please review the following plan we discussed and let me know if I can assist you in the future.   Screening recommendations/referrals: Colonoscopy: no longer required Recommended yearly ophthalmology/optometry visit for glaucoma screening and checkup Recommended yearly dental visit for hygiene and checkup  Vaccinations: Influenza vaccine: done today Pneumococcal vaccine: done 03/27/17 Tdap vaccine: done 01/07/20 Shingles vaccine: done 01/22/18 & 03/25/18   Covid-19: done 05/09/19, 06/09/19 & 02/09/20  Advanced directives: Advance directive discussed with you today. I have provided a copy for you to complete at home and have notarized. Once this is complete please bring a copy in to our office so we can scan it into your chart.   Conditions/risks identified: Keep up the great work!  Next appointment: Follow up in one year for your annual wellness visit.   Preventive Care 8 Years and Older, Male Preventive care refers to lifestyle choices and visits with your health care provider that can promote health and wellness. What does preventive care include? A yearly physical exam. This is also called an annual well check. Dental exams once or twice a year. Routine eye exams. Ask your health care provider how often you should have your eyes checked. Personal lifestyle choices, including: Daily care of your teeth and gums. Regular physical activity. Eating a healthy diet. Avoiding tobacco and drug use. Limiting alcohol use. Practicing safe sex. Taking low doses of aspirin every day. Taking vitamin and mineral supplements as recommended by your health care provider. What happens during an annual well check? The services and screenings done by your health care provider during your annual well check will depend on your age, overall health, lifestyle risk  factors, and family history of disease. Counseling  Your health care provider may ask you questions about your: Alcohol use. Tobacco use. Drug use. Emotional well-being. Home and relationship well-being. Sexual activity. Eating habits. History of falls. Memory and ability to understand (cognition). Work and work Astronomer. Screening  You may have the following tests or measurements: Height, weight, and BMI. Blood pressure. Lipid and cholesterol levels. These may be checked every 5 years, or more frequently if you are over 47 years old. Skin check. Lung cancer screening. You may have this screening every year starting at age 30 if you have a 30-pack-year history of smoking and currently smoke or have quit within the past 15 years. Fecal occult blood test (FOBT) of the stool. You may have this test every year starting at age 99. Flexible sigmoidoscopy or colonoscopy. You may have a sigmoidoscopy every 5 years or a colonoscopy every 10 years starting at age 3. Prostate cancer screening. Recommendations will vary depending on your family history and other risks. Hepatitis C blood test. Hepatitis B blood test. Sexually transmitted disease (STD) testing. Diabetes screening. This is done by checking your blood sugar (glucose) after you have not eaten for a while (fasting). You may have this done every 1-3 years. Abdominal aortic aneurysm (AAA) screening. You may need this if you are a current or former smoker. Osteoporosis. You may be screened starting at age 70 if you are at high risk. Talk with your health care provider about your test results, treatment options, and if necessary, the need for more tests. Vaccines  Your health care provider may recommend certain vaccines, such as: Influenza vaccine. This is recommended every year. Tetanus, diphtheria, and acellular pertussis (  Tdap, Td) vaccine. You may need a Td booster every 10 years. Zoster vaccine. You may need this after age  16. Pneumococcal 13-valent conjugate (PCV13) vaccine. One dose is recommended after age 65. Pneumococcal polysaccharide (PPSV23) vaccine. One dose is recommended after age 66. Talk to your health care provider about which screenings and vaccines you need and how often you need them. This information is not intended to replace advice given to you by your health care provider. Make sure you discuss any questions you have with your health care provider. Document Released: 04/29/2015 Document Revised: 12/21/2015 Document Reviewed: 02/01/2015 Elsevier Interactive Patient Education  2017 [employer] Prevention in the Home Falls can cause injuries. They can happen to people of all ages. There are many things you can do to make your home safe and to help prevent falls. What can I do on the outside of my home? Regularly fix the edges of walkways and driveways and fix any cracks. Remove anything that might make you trip as you walk through a door, such as a raised step or threshold. Trim any bushes or trees on the path to your home. Use bright outdoor lighting. Clear any walking paths of anything that might make someone trip, such as rocks or tools. Regularly check to see if handrails are loose or broken. Make sure that both sides of any steps have handrails. Any raised decks and porches should have guardrails on the edges. Have any leaves, snow, or ice cleared regularly. Use sand or salt on walking paths during winter. Clean up any spills in your garage right away. This includes oil or grease spills. What can I do in the bathroom? Use night lights. Install grab bars by the toilet and in the tub and shower. Do not use towel bars as grab bars. Use non-skid mats or decals in the tub or shower. If you need to sit down in the shower, use a plastic, non-slip stool. Keep the floor dry. Clean up any water that spills on the floor as soon as it happens. Remove soap buildup in the tub or shower  regularly. Attach bath mats securely with double-sided non-slip rug tape. Do not have throw rugs and other things on the floor that can make you trip. What can I do in the bedroom? Use night lights. Make sure that you have a light by your bed that is easy to reach. Do not use any sheets or blankets that are too big for your bed. They should not hang down onto the floor. Have a firm chair that has side arms. You can use this for support while you get dressed. Do not have throw rugs and other things on the floor that can make you trip. What can I do in the kitchen? Clean up any spills right away. Avoid walking on wet floors. Keep items that you use a lot in easy-to-reach places. If you need to reach something above you, use a strong step stool that has a grab bar. Keep electrical cords out of the way. Do not use floor polish or wax that makes floors slippery. If you must use wax, use non-skid floor wax. Do not have throw rugs and other things on the floor that can make you trip. What can I do with my stairs? Do not leave any items on the stairs. Make sure that there are handrails on both sides of the stairs and use them. Fix handrails that are broken or loose. Make sure that handrails are  as long as the stairways. Check any carpeting to make sure that it is firmly attached to the stairs. Fix any carpet that is loose or worn. Avoid having throw rugs at the top or bottom of the stairs. If you do have throw rugs, attach them to the floor with carpet tape. Make sure that you have a light switch at the top of the stairs and the bottom of the stairs. If you do not have them, ask someone to add them for you. What else can I do to help prevent falls? Wear shoes that: Do not have high heels. Have rubber bottoms. Are comfortable and fit you well. Are closed at the toe. Do not wear sandals. If you use a stepladder: Make sure that it is fully opened. Do not climb a closed stepladder. Make sure that  both sides of the stepladder are locked into place. Ask someone to hold it for you, if possible. Clearly mark and make sure that you can see: Any grab bars or handrails. First and last steps. Where the edge of each step is. Use tools that help you move around (mobility aids) if they are needed. These include: Canes. Walkers. Scooters. Crutches. Turn on the lights when you go into a dark area. Replace any light bulbs as soon as they burn out. Set up your furniture so you have a clear path. Avoid moving your furniture around. If any of your floors are uneven, fix them. If there are any pets around you, be aware of where they are. Review your medicines with your doctor. Some medicines can make you feel dizzy. This can increase your chance of falling. Ask your doctor what other things that you can do to help prevent falls. This information is not intended to replace advice given to you by your health care provider. Make sure you discuss any questions you have with your health care provider. Document Released: 01/27/2009 Document Revised: 09/08/2015 Document Reviewed: 05/07/2014 Elsevier Interactive Patient Education  2017 [employer].

## 2020-12-26 NOTE — Progress Notes (Signed)
Subjective:   Marc Bond is a 75 y.o. male who presents for Medicare Annual/Subsequent preventive examination.  Review of Systems     Cardiac Risk Factors include: advanced age (>21men, >34 women);diabetes mellitus;dyslipidemia;male gender     Objective:    Today's Vitals   12/26/20 1052  BP: 120/80  Pulse: 77  Resp: 16  Temp: 98.2 F (36.8 C)  TempSrc: Oral  SpO2: 99%  Weight: 184 lb 3.2 oz (83.6 kg)  Height: 5\' 10"  (1.778 m)   Body mass index is 26.43 kg/m.  Advanced Directives 12/26/2020 11/09/2019 10/20/2018 10/16/2017 09/29/2015  Does Patient Have a Medical Advance Directive? No No Yes Yes Yes  Type of Advance Directive - 10/01/2015;Living will Healthcare Power of [place];Living will Healthcare Power of [place];Living will  Does patient want to make changes to medical advance directive? - - - - No - Patient declined  Copy of Healthcare Power of Attorney in Chart? - - No - copy requested No - copy requested -  Would patient like information on creating a medical advance directive? Yes (MAU/Ambulatory/Procedural Areas - Information given) Yes (MAU/Ambulatory/Procedural Areas - Information given) - - -    Current Medications (verified) Outpatient Encounter Medications as of 12/26/2020  Medication Sig   alfuzosin (UROXATRAL) 10 MG 24 hr tablet Take 10 mg by mouth daily.   aspirin 81 MG chewable tablet Chew by mouth daily.   cholecalciferol (VITAMIN D) 1000 units tablet Take 1,000 Units by mouth daily.   finasteride (PROSCAR) 5 MG tablet Take 5 mg by mouth daily.   glucose blood test strip ONETOUCH VERIO (In Vitro Strip) - Historical Medication  1 daily Active   Lancets (ONETOUCH DELICA PLUS LANCET33G) MISC Apply topically 2 (two) times daily.   omeprazole (PRILOSEC) 40 MG capsule TAKE 1 CAPSULE BY MOUTH EVERY DAY   rosuvastatin (CRESTOR) 40 MG tablet TAKE 1 TABLET(40 MG) BY MOUTH DAILY   sitaGLIPtin-metformin (JANUMET) 50-1000 MG tablet Take 1 tablet  by mouth 2 (two) times daily with a meal.   [DISCONTINUED] glucose blood test strip OneTouch Verio test strips  BY XX ROUTE 2 TIMES DAILY USE AS INSTRUCTED   [DISCONTINUED] NEOMYCIN-POLYMYXIN-HYDROCORTISONE (CORTISPORIN) 1 % SOLN OTIC solution Place 3 drops into the right ear every 8 (eight) hours.   No facility-administered encounter medications on file as of 12/26/2020.    Allergies (verified) Ace inhibitors   History: Past Medical History:  Diagnosis Date   Bilateral cataracts    CKD (chronic kidney disease) stage 3, GFR 30-59 ml/min (HCC)    Degenerative disc disease, lumbar    Diabetes mellitus without complication (HCC)    Enlarged prostate    GERD (gastroesophageal reflux disease)    Hyperlipidemia    Hypertension    IBS (irritable bowel syndrome)    Mood disorder (HCC) 08/20/2016   Peptic ulcer disease    Primary osteoarthritis of right knee    Renal cyst, right    Past Surgical History:  Procedure Laterality Date   COLONOSCOPY  11/2011   normal - 10 yr follow up Dr. 12/2011   PROSTATE SURGERY     Family History  Problem Relation Age of Onset   Hypertension Sister    Kidney disease Mother    Hypertension Mother    Emphysema Father    Diabetes Brother    Diabetes Sister    Hypertension Sister    Diabetes Brother    Social History   Socioeconomic History   Marital status: Widowed  Spouse name: Not on file   Number of children: 2   Years of education: Not on file   Highest education level: 11th grade  Occupational History   Occupation: Retired  Tobacco Use   Smoking status: Former    Packs/day: 0.00    Years: 13.00    Pack years: 0.00    Types: Cigarettes, Pipe, Cigars    Quit date: 04/16/1978    Years since quitting: 42.7   Smokeless tobacco: Never   Tobacco comments:    I quit smoking about 40 years ago.  Vaping Use   Vaping Use: Never used  Substance and Sexual Activity   Alcohol use: No   Drug use: Not Currently   Sexual activity: Yes  Other  Topics Concern   Not on file  Social History Narrative   Pt's sister lives with him and 2 grandsons.    Social Determinants of Health   Financial Resource Strain: Low Risk    Difficulty of Paying Living Expenses: Not hard at all  Food Insecurity: No Food Insecurity   Worried About Programme researcher, broadcasting/film/video in the Last Year: Never true   Ran Out of Food in the Last Year: Never true  Transportation Needs: No Transportation Needs   Lack of Transportation (Medical): No   Lack of Transportation (Non-Medical): No  Physical Activity: Sufficiently Active   Days of Exercise per Week: 5 days   Minutes of Exercise per Session: 60 min  Stress: No Stress Concern Present   Feeling of Stress : Not at all  Social Connections: Moderately Isolated   Frequency of Communication with Friends and Family: More than three times a week   Frequency of Social Gatherings with Friends and Family: Three times a week   Attends Religious Services: More than 4 times per year   Active Member of Clubs or Organizations: No   Attends Banker Meetings: Never   Marital Status: Widowed    Tobacco Counseling Counseling given: Not Answered Tobacco comments: I quit smoking about 40 years ago.   Clinical Intake:  Pre-visit preparation completed: Yes  Pain : No/denies pain     BMI - recorded: 26.43 Nutritional Status: BMI 25 -29 Overweight Nutritional Risks: None Diabetes: Yes CBG done?: No Did pt. bring in CBG monitor from home?: No  How often do you need to have someone help you when you read instructions, pamphlets, or other written materials from your doctor or pharmacy?: 1 - Never  Nutrition Risk Assessment:  Has the patient had any N/V/D within the last 2 months?  No  Does the patient have any non-healing wounds?  No  Has the patient had any unintentional weight loss or weight gain?  No   Diabetes:  Is the patient diabetic?  Yes  If diabetic, was a CBG obtained today?  No  Did the  patient bring in their glucometer from home?  No  How often do you monitor your CBG's? Twice daily.   Financial Strains and Diabetes Management:  Are you having any financial strains with the device, your supplies or your medication? No .  Does the patient want to be seen by Chronic Care Management for management of their diabetes?  No  Would the patient like to be referred to a Nutritionist or for Diabetic Management?  No   Diabetic Exams:  Diabetic Eye Exam: Completed 01/01/20 negative retinopathy.   Diabetic Foot Exam: Completed 07/13/20.   Interpreter Needed?: No  Information entered by :: Reather Littler  LPN   Activities of Daily Living In your present state of health, do you have any difficulty performing the following activities: 12/26/2020 12/06/2020  Hearing? N N  Vision? N N  Difficulty concentrating or making decisions? N Y  Walking or climbing stairs? N N  Dressing or bathing? N N  Doing errands, shopping? N N  Preparing Food and eating ? N -  Using the Toilet? N -  In the past six months, have you accidently leaked urine? N -  Do you have problems with loss of bowel control? N -  Managing your Medications? N -  Managing your Finances? N -  Housekeeping or managing your Housekeeping? N -  Some recent data might be hidden    Patient Care Team: Reubin MilanBerglund, Laura H, MD as PCP - General (Internal Medicine) Victorino DecemberFriedman, Eugene, MD as Referring Physician (Pulmonary Disease) Jeronimo NormaJelesoff, Nicole (Inactive) as Physician Assistant (Endocrinology) Jacquiline Doeothman, Jason Robert, MD as Referring Physician (Urology) Arrie SenatePatterson, Brandy, MD as Referring Physician (Cardiology)  Indicate any recent Medical Services you may have received from other than [facility] providers in the past year (date may be approximate).     Assessment:   This is a routine wellness examination for Lollie SailsHarry.  Hearing/Vision screen Hearing Screening - Comments::  Pt denies hearing difficulty Vision Screening - Comments::  [facility]  Dietary issues and exercise activities discussed: Current Exercise Habits: Home exercise routine, Type of exercise: walking, Time (Minutes): 60, Frequency (Times/Week): 5, Weekly Exercise (Minutes/Week): 300, Intensity: Mild, Exercise limited by: None identified   Goals Addressed             This Visit's Progress    DIET - INCREASE WATER INTAKE   On track    Recommend to drink at least 6-8 8oz glasses of water per day.       Depression Screen PHQ 2/9 Scores 12/26/2020 12/06/2020 09/28/2020 07/13/2020 06/14/2020 02/10/2020 11/09/2019  PHQ - 2 Score 0 0 3 0 0 0 0  PHQ- 9 Score 0 0 5 0 0 0 -    Fall Risk Fall Risk  12/26/2020 12/06/2020 09/28/2020 07/13/2020 06/14/2020  Falls in the past year? 0 0 0 0 0  Number falls in past yr: 0 0 - - -  Injury with Fall? 0 0 - - -  Risk for fall due to : No Fall Risks No Fall Risks - - -  Risk for fall due to: Comment - - - - -  Follow up Falls prevention discussed Falls evaluation completed Falls evaluation completed Falls evaluation completed Falls evaluation completed    FALL RISK PREVENTION PERTAINING TO THE HOME:  Any stairs in or around the home? No  If so, are there any without handrails? No  Home free of loose throw rugs in walkways, pet beds, electrical cords, etc? Yes  Adequate lighting in your home to reduce risk of falls? Yes   ASSISTIVE DEVICES UTILIZED TO PREVENT FALLS:  Life alert? No  Use of a cane, walker or w/c? No  Grab bars in the bathroom? Yes  Shower chair or bench in shower? Yes  Elevated toilet seat or a handicapped toilet? No   TIMED UP AND GO:  Was the test performed? Yes .  Length of time to ambulate 10 feet: 5 sec.   Gait steady and fast without use of assistive device  Cognitive Function: Normal cognitive status assessed by direct observation by this Nurse Health Advisor. No abnormalities found.  6CIT Screen 11/09/2019 10/20/2018 10/16/2017  What Year? 0 points 0  points 0 points  What month? 0 points 0 points 0 points  What time? 0 points 0 points 0 points  Count back from 20 0 points 0 points 0 points  Months in reverse 0 points 0 points 0 points  Repeat phrase 0 points 4 points 10 points  Total Score 0 4 10    Immunizations Immunization History  Administered Date(s) Administered   Fluad Quad(high Dose 65+) 12/30/2018, 01/07/2020, 12/26/2020   Influenza, High Dose Seasonal PF 12/19/2017   Influenza,inj,Quad PF,6+ Mos 12/26/2016   Influenza-Unspecified 02/16/2015, 12/26/2016   Moderna Sars-Covid-2 Vaccination 05/09/2019, 06/09/2019, 02/09/2020   Pneumococcal Conjugate-13 09/29/2015, 03/27/2017   Pneumococcal Polysaccharide-23 04/17/2008, 12/04/2012   Tdap 10/24/2017   Zoster Recombinat (Shingrix) 01/22/2018, 03/25/2018    TDAP status: Up to date  Flu Vaccine status: Completed at today's visit  Pneumococcal vaccine status: Up to date  Covid-19 vaccine status: Completed vaccines  Qualifies for Shingles Vaccine? Yes   Zostavax completed Yes   Shingrix Completed?: Yes  Screening Tests Health Maintenance  Topic Date Due   COVID-19 Vaccine (4 - Booster for Moderna series) 05/03/2020   HEMOGLOBIN A1C  12/29/2020   OPHTHALMOLOGY EXAM  12/31/2020   FOOT EXAM  07/13/2021   URINE MICROALBUMIN  07/13/2021   TETANUS/TDAP  10/25/2027   INFLUENZA VACCINE  Completed   PNA vac Low Risk Adult  Completed   Zoster Vaccines- Shingrix  Completed   Hepatitis C Screening  Addressed   HPV VACCINES  Aged Out    Health Maintenance  Health Maintenance Due  Topic Date Due   COVID-19 Vaccine (4 - Booster for Moderna series) 05/03/2020    Colorectal cancer screening: No longer required.   Lung Cancer Screening: (Low Dose CT Chest recommended if Age 43-80 years, 30 pack-year currently smoking OR have quit w/in 15years.) does not qualify.   Additional Screening:  Hepatitis C Screening: does qualify; Completed 03/02/12  Vision Screening:  Recommended annual ophthalmology exams for early detection of glaucoma and other disorders of the eye. Is the patient up to date with their annual eye exam?  Yes  Who is the provider or what is the name of the office in which the patient attends annual eye exams? [facility].   Dental Screening: Recommended annual dental exams for proper oral hygiene  Community Resource Referral / Chronic Care Management: CRR required this visit?  No   CCM required this visit?  No      Plan:     I have personally reviewed and noted the following in the patient's chart:   Medical and social history Use of alcohol, tobacco or illicit drugs  Current medications and supplements including opioid prescriptions. Patient is not currently taking opioid prescriptions. Functional ability and status Nutritional status Physical activity Advanced directives List of other physicians Hospitalizations, surgeries, and ER visits in previous 12 months Vitals Screenings to include cognitive, depression, and falls Referrals and appointments  In addition, I have reviewed and discussed with patient certain preventive protocols, quality metrics, and best practice recommendations. A written personalized care plan for preventive services as well as general preventive health recommendations were provided to patient.     Reather Littler, LPN   [id number]   Nurse Notes: none

## 2021-02-09 LAB — MICROALBUMIN / CREATININE URINE RATIO: Microalb Creat Ratio: 29

## 2021-02-09 LAB — MICROALBUMIN, URINE: Microalb, Ur: 29

## 2021-05-15 LAB — HEMOGLOBIN A1C: Hemoglobin A1C: 7.1

## 2021-05-30 LAB — HM DIABETES EYE EXAM

## 2021-07-18 ENCOUNTER — Encounter: Payer: Self-pay | Admitting: Internal Medicine

## 2021-07-18 ENCOUNTER — Ambulatory Visit (INDEPENDENT_AMBULATORY_CARE_PROVIDER_SITE_OTHER): Payer: Medicare Other | Admitting: Internal Medicine

## 2021-07-18 VITALS — BP 128/72 | HR 80 | Ht 70.0 in | Wt 182.0 lb

## 2021-07-18 DIAGNOSIS — N1832 Chronic kidney disease, stage 3b: Secondary | ICD-10-CM | POA: Diagnosis not present

## 2021-07-18 DIAGNOSIS — E785 Hyperlipidemia, unspecified: Secondary | ICD-10-CM

## 2021-07-18 DIAGNOSIS — E118 Type 2 diabetes mellitus with unspecified complications: Secondary | ICD-10-CM

## 2021-07-18 DIAGNOSIS — E1169 Type 2 diabetes mellitus with other specified complication: Secondary | ICD-10-CM

## 2021-07-18 DIAGNOSIS — K5901 Slow transit constipation: Secondary | ICD-10-CM

## 2021-07-18 DIAGNOSIS — K279 Peptic ulcer, site unspecified, unspecified as acute or chronic, without hemorrhage or perforation: Secondary | ICD-10-CM

## 2021-07-18 DIAGNOSIS — Z1211 Encounter for screening for malignant neoplasm of colon: Secondary | ICD-10-CM

## 2021-07-18 DIAGNOSIS — N401 Enlarged prostate with lower urinary tract symptoms: Secondary | ICD-10-CM

## 2021-07-18 DIAGNOSIS — N138 Other obstructive and reflux uropathy: Secondary | ICD-10-CM

## 2021-07-18 MED ORDER — ROSUVASTATIN CALCIUM 40 MG PO TABS: ORAL_TABLET | ORAL | 3 refills | Status: DC

## 2021-07-18 MED ORDER — DICYCLOMINE HCL 10 MG PO CAPS: 10.0000 mg | ORAL_CAPSULE | Freq: Three times a day (TID) | ORAL | 0 refills | Status: DC

## 2021-07-18 MED ORDER — OMEPRAZOLE 40 MG PO CPDR: DELAYED_RELEASE_CAPSULE | ORAL | 3 refills | Status: DC

## 2021-07-18 NOTE — Progress Notes (Signed)
? ? ?Date:  07/18/2021  ? ?Name:  Marc Bond   DOB:  1945-11-27   MRN:  [id number] ? ? ?Chief Complaint: Annual Exam (Medicare Annual. ) ?Marc Bond is a 76 y.o. male who presents today for his Complete Annual Exam. He feels well. He reports walking. He reports he is sleeping well.  ? ?Colonoscopy: 11/2011 aged out? ? ?Immunization History  ?Administered Date(s) Administered  ? Fluad Quad(high Dose 65+) 12/30/2018, 01/07/2020, 12/26/2020  ? Influenza, High Dose Seasonal PF 12/19/2017  ? Influenza,inj,Quad PF,6+ Mos 12/26/2016  ? Influenza-Unspecified 02/16/2015, 12/26/2016  ? Moderna Sars-Covid-2 Vaccination 05/09/2019, 06/09/2019, 02/09/2020  ? Pneumococcal Conjugate-13 09/29/2015, 03/27/2017  ? Pneumococcal Polysaccharide-23 04/17/2008, 12/04/2012  ? Tdap 10/24/2017  ? Zoster Recombinat (Shingrix) 01/22/2018, 03/25/2018  ? ?Health Maintenance Due  ?Topic Date Due  ? FOOT EXAM  07/13/2021  ?  ?Lab Results  ?Component Value Date  ? PSA1 2.7 05/11/2019  ? PSA1 1.6 09/29/2015  ? ? ?Diabetes ?He presents for his follow-up diabetic visit. He has type 2 diabetes mellitus. His disease course has been stable. Pertinent negatives for hypoglycemia include no dizziness, headaches or nervousness/anxiousness. Pertinent negatives for diabetes include no chest pain, no fatigue, no foot paresthesias and no visual change. Symptoms are stable. Current diabetic treatment includes oral agent (dual therapy) (janumet).  ?Gastroesophageal Reflux ?He complains of heartburn. He reports no abdominal pain, no chest pain, no choking, no coughing, no dysphagia, no sore throat or no wheezing. This is a recurrent problem. The problem occurs rarely. Pertinent negatives include no fatigue. He has tried a PPI for the symptoms.  ?Hyperlipidemia ?This is a chronic problem. The problem is controlled. Pertinent negatives include no chest pain, myalgias or shortness of breath. Current antihyperlipidemic treatment includes statins. The current treatment  provides significant improvement of lipids.  ?Benign Prostatic Hypertrophy ?This is a chronic problem. The problem is unchanged. Irritative symptoms do not include frequency. Pertinent negatives include no chills or dysuria. Past treatments include finasteride (and Uroxatral).  ? ?Lab Results  ?Component Value Date  ? NA 135 11/15/2020  ? K 4.9 11/15/2020  ? CO2 18 (L) 11/15/2020  ? GLUCOSE 133 (H) 11/15/2020  ? BUN 21 11/15/2020  ? CREATININE 1.62 (H) 11/15/2020  ? CALCIUM 10.3 (H) 11/15/2020  ? EGFR 44 (L) 11/15/2020  ? GFRNONAA 40 (L) 02/10/2020  ? ?Lab Results  ?Component Value Date  ? CHOL 135 07/13/2020  ? HDL 50 07/13/2020  ? [place] 71 07/13/2020  ? TRIG 67 07/13/2020  ? CHOLHDL 2.7 07/13/2020  ? ?Lab Results  ?Component Value Date  ? TSH 1.30 09/29/2017  ? ?Lab Results  ?Component Value Date  ? HGBA1C 7.1 05/15/2021  ? ?Lab Results  ?Component Value Date  ? WBC 8.9 07/13/2020  ? HGB 13.5 07/13/2020  ? HCT 39.8 07/13/2020  ? MCV 80 07/13/2020  ? PLT 236 07/13/2020  ? ?Lab Results  ?Component Value Date  ? ALT 25 11/15/2020  ? AST 24 11/15/2020  ? ALKPHOS 73 11/15/2020  ? BILITOT 0.4 11/15/2020  ? ?Lab Results  ?Component Value Date  ? VD25OH 65.0 02/10/2020  ?  ? ?Review of Systems  ?Constitutional:  Negative for appetite change, chills, diaphoresis, fatigue and unexpected weight change.  ?HENT:  Negative for hearing loss, sore throat, tinnitus, trouble swallowing and voice change.   ?Eyes:  Negative for visual disturbance.  ?     Eye irritation  ?Respiratory:  Negative for cough, choking, shortness of breath and wheezing.   ?  Cardiovascular:  Negative for chest pain, palpitations and leg swelling.  ?Gastrointestinal:  Positive for heartburn. Negative for abdominal pain, blood in stool, constipation, diarrhea and dysphagia.  ?Genitourinary:  Negative for difficulty urinating, dysuria and frequency.  ?Musculoskeletal:  Negative for arthralgias, back pain and myalgias.  ?Skin:  Negative for color change and  rash.  ?Neurological:  Negative for dizziness, syncope and headaches.  ?Hematological:  Negative for adenopathy.  ?Psychiatric/Behavioral:  Negative for dysphoric mood and sleep disturbance. The patient is not nervous/anxious.   ? ?Patient Active Problem List  ? Diagnosis Date Noted  ? 1st degree AV block 05/31/2020  ? CKD (chronic kidney disease) stage 3, GFR 30-59 ml/min ([place]) 10/13/2019  ? 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 Renal cyst, right 10/29/2016  ? Pulmonary cavitary lesion 10/16/2016  ? 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 Degenerative disc disease, lumbar 09/29/2015  ? Primary osteoarthritis of right knee 09/29/2015  ? Irritable colon 03/12/2015  ? Type II diabetes mellitus with complication ([place]) 87/86/7672  ? Hyperlipidemia associated with type 2 diabetes mellitus ([place]) 03/12/2015  ? BPH with obstruction/lower urinary tract symptoms 03/12/2015  ? Failure of erection 03/12/2015  ? Seborrhea capitis 03/12/2015  ? Peptic ulcer disease 03/12/2015  ? Acquired deformities of toe 03/03/2012  ? Bilateral cataracts 12/10/2011  ? ? ?Allergies  ?Allergen Reactions  ? Ace Inhibitors Other (See Comments)  ?  Sx hypotension on low dose  ? ? ?Past Surgical History:  ?Procedure Laterality Date  ? COLONOSCOPY  11/2011  ? normal - 10 yr follow up Dr. Epimenio Foot  ? PROSTATE SURGERY    ? ? ?Social History  ? ?Tobacco Use  ? Smoking status: Former  ?  Packs/day: 0.00  ?  Years: 13.00  ?  Pack years: 0.00  ?  Types: Cigarettes, Pipe, Cigars  ?  Quit date: 04/16/1978  ?  Years since quitting: 43.2  ? Smokeless tobacco: Never  ? Tobacco comments:  ?  I quit smoking about 40 years ago.  ?Vaping Use  ? Vaping Use: Never used  ?Substance Use Topics  ? Alcohol use: No  ? Drug use: Not Currently  ? ? ? ?Medication list has been reviewed and updated. ? ?Current Meds  ?Medication Sig  ? alfuzosin (UROXATRAL) 10 MG 24 hr tablet Take 10 mg by mouth daily.  ? aspirin 81 MG chewable tablet Chew by mouth daily.  ? cholecalciferol  (VITAMIN D) 1000 units tablet Take 1,000 Units by mouth daily.  ? Cysteamine Bitartrate (PROCYSBI) 300 MG PACK Use 1 each 2 (two) times daily Use as instructed.  ? finasteride (PROSCAR) 5 MG tablet Take 5 mg by mouth daily.  ? glucose blood test strip ONETOUCH VERIO (In Vitro Strip) - Historical Medication ? 1 daily ?Active  ? Lancets (ONETOUCH DELICA PLUS [id number]) MISC Apply topically 2 (two) times daily.  ? omeprazole (PRILOSEC) 40 MG capsule TAKE 1 CAPSULE BY MOUTH EVERY DAY  ? rosuvastatin (CRESTOR) 40 MG tablet TAKE 1 TABLET(40 MG) BY MOUTH DAILY  ? sitaGLIPtin-metformin (JANUMET) 50-1000 MG tablet Take 1 tablet by mouth 2 (two) times daily with a meal.  ? ? ? ?  07/18/2021  ?  9:28 AM 12/06/2020  ? 11:35 AM 09/28/2020  ? 11:04 AM 07/13/2020  ?  9:53 AM  ?GAD 7 : Generalized Anxiety Score  ?Nervous, Anxious, on Edge 0 0 0 0  ?Control/stop worrying 0 0 0 0  ?Worry too much - different things 0 0 0 0  ?  Trouble relaxing 0 0 0 0  ?Restless 0 0 0 0  ?Easily annoyed or irritable 0 0 0 0  ?Afraid - awful might happen 0 0 0 0  ?Total GAD 7 Score 0 0 0 0  ?Anxiety Difficulty Not difficult at all     ? ? ? ?  07/18/2021  ?  9:28 AM  ?Depression screen PHQ 2/9  ?Decreased Interest 0  ?Down, Depressed, Hopeless 0  ?PHQ - 2 Score 0  ?Altered sleeping 0  ?Tired, decreased energy 0  ?Change in appetite 0  ?Feeling bad or failure about yourself  0  ?Trouble concentrating 0  ?Moving slowly or fidgety/restless 0  ?Suicidal thoughts 0  ?PHQ-9 Score 0  ?Difficult doing work/chores Not difficult at all  ? ? ?BP Readings from Last 3 Encounters:  ?07/18/21 128/72  ?12/26/20 120/80  ?12/06/20 122/86  ? ? ?Physical Exam ?Vitals and nursing note reviewed.  ?Constitutional:   ?   Appearance: Normal appearance. He is well-developed.  ?HENT:  ?   Head: Normocephalic.  ?   Right Ear: Tympanic membrane, ear canal and external ear normal.  ?   Left Ear: Tympanic membrane, ear canal and external ear normal.  ?   Nose: Nose normal.  ?Eyes:  ?    Conjunctiva/sclera: Conjunctivae normal.  ?   Pupils: Pupils are equal, round, and reactive to light.  ?Neck:  ?   Thyroid: No thyromegaly.  ?   Vascular: No carotid bruit.  ?Cardiovascular:  ?   Rate and Rhythm:

## 2021-07-19 LAB — LIPID PANEL
Chol/HDL Ratio: 2.6 ratio (ref 0.0–5.0)
Cholesterol, Total: 134 mg/dL (ref 100–199)
HDL: 52 mg/dL (ref >39)
LDL Chol Calc (NIH): 67 mg/dL (ref 0–99)
Triglycerides: 73 mg/dL (ref 0–149)
VLDL Cholesterol Cal: 15 mg/dL (ref 5–40)

## 2021-07-19 LAB — CBC WITH DIFFERENTIAL/PLATELET
Basophils Absolute: 0.1 10*3/uL (ref 0.0–0.2)
Basos: 1 %
EOS (ABSOLUTE): 0.1 10*3/uL (ref 0.0–0.4)
Eos: 1 %
Hematocrit: 37.4 % — ABNORMAL LOW (ref 37.5–51.0)
Hemoglobin: 12.9 g/dL — ABNORMAL LOW (ref 13.0–17.7)
Immature Grans (Abs): 0 10*3/uL (ref 0.0–0.1)
Immature Granulocytes: 0 %
Lymphocytes Absolute: 1.7 10*3/uL (ref 0.7–3.1)
Lymphs: 21 %
MCH: 27.7 pg (ref 26.6–33.0)
MCHC: 34.5 g/dL (ref 31.5–35.7)
MCV: 80 fL (ref 79–97)
Monocytes Absolute: 0.7 10*3/uL (ref 0.1–0.9)
Monocytes: 9 %
Neutrophils Absolute: 5.9 10*3/uL (ref 1.4–7.0)
Neutrophils: 68 %
Platelets: 222 10*3/uL (ref 150–450)
RBC: 4.66 x10E6/uL (ref 4.14–5.80)
RDW: 13.3 % (ref 11.6–15.4)
WBC: 8.5 10*3/uL (ref 3.4–10.8)

## 2021-07-19 LAB — COMPREHENSIVE METABOLIC PANEL
ALT: 19 IU/L (ref 0–44)
AST: 18 IU/L (ref 0–40)
Albumin/Globulin Ratio: 1.3 (ref 1.2–2.2)
Albumin: 4.4 g/dL (ref 3.7–4.7)
Alkaline Phosphatase: 74 IU/L (ref 44–121)
BUN/Creatinine Ratio: 15 (ref 10–24)
BUN: 22 mg/dL (ref 8–27)
Bilirubin Total: 0.4 mg/dL (ref 0.0–1.2)
CO2: 20 mmol/L (ref 20–29)
Calcium: 10.3 mg/dL — ABNORMAL HIGH (ref 8.6–10.2)
Chloride: 102 mmol/L (ref 96–106)
Creatinine, Ser: 1.49 mg/dL — ABNORMAL HIGH (ref 0.76–1.27)
Globulin, Total: 3.3 g/dL (ref 1.5–4.5)
Glucose: 112 mg/dL — ABNORMAL HIGH (ref 70–99)
Potassium: 5.2 mmol/L (ref 3.5–5.2)
Sodium: 138 mmol/L (ref 134–144)
Total Protein: 7.7 g/dL (ref 6.0–8.5)
eGFR: 48 mL/min/{1.73_m2} — ABNORMAL LOW (ref >59)

## 2021-07-19 LAB — PSA: Prostate Specific Ag, Serum: 2.2 ng/mL (ref 0.0–4.0)

## 2021-07-19 LAB — TSH: TSH: 1.93 u[IU]/mL (ref 0.450–4.500)

## 2021-08-30 ENCOUNTER — Other Ambulatory Visit: Payer: Self-pay | Admitting: Internal Medicine

## 2021-08-30 DIAGNOSIS — K279 Peptic ulcer, site unspecified, unspecified as acute or chronic, without hemorrhage or perforation: Secondary | ICD-10-CM

## 2021-08-30 NOTE — Telephone Encounter (Addendum)
Last RF 07/18/21 #90 3 RF there are refills on this med- attempted to call pharmacy but could not hold on that long  ? ?Requested Prescriptions  ?Refused Prescriptions Disp Refills  ? omeprazole (PRILOSEC) 40 MG capsule [Pharmacy Med Name: OMEPRAZOLE 40MG  CAPSULES] 90 capsule 3  ?  Sig: TAKE 1 CAPSULE BY MOUTH EVERY DAY  ?  ? Gastroenterology: Proton Pump Inhibitors Passed - 08/30/2021 10:03 AM  ?  ?  Passed - Valid encounter within last 12 months  ?  Recent Outpatient Visits   ? ?      ? 1 month ago Hyperlipidemia associated with type 2 diabetes mellitus (HCC)  ? [facility], MD  ? 8 months ago Acute otitis externa of right ear, unspecified type  ? [facility], MD  ? 11 months ago Left hip pain  ? [facility], MD  ? 1 year ago Hyperlipidemia associated with type 2 diabetes mellitus (HCC)  ? [facility], MD  ? 1 year ago Hypotension due to medication  ? [facility], MD  ? ?  ?  ?Future Appointments   ? ?        ? In 10 months Reubin Milan, MD [facility]  ? ?  ? ? ?  ?  ?  ? ? ?

## 2021-10-11 LAB — HEMOGLOBIN A1C: Hemoglobin A1C: 7.6

## 2021-12-22 ENCOUNTER — Ambulatory Visit: Payer: Self-pay | Admitting: *Deleted

## 2021-12-22 NOTE — Telephone Encounter (Signed)
  Chief Complaint: neck pain Symptoms: right neck/shoulder pain- nerve pain Frequency: woke from nap with pain yesterday Pertinent Negatives: Patient denies headache, fever, chest pain, difficulty breathing, neck swelling Disposition: [] ED /[] Urgent Care (no appt availability in office) / [] Appointment(In office/virtual)/ []  [place] [place] [facility]/ [] Home Care/ [x] Refused Recommended Disposition /[] [place] [facility]/ []  Follow-up with PCP Additional Notes: Patient lives in and is requesting appointment- next week- advised UC for pain until that appointment.

## 2021-12-22 NOTE — Telephone Encounter (Signed)
Reason for Disposition  [1] SEVERE neck pain (e.g., excruciating, unable to do any normal activities) AND [2] not improved after 2 hours of pain medicine  Answer Assessment - Initial Assessment Questions 1. ONSET: "When did the pain begin?"      yesterday 2. LOCATION: "Where does it hurt?"      Neck to shoulder- Right 3. PATTERN "Does the pain come and go, or has it been constant since it started?"      constant 4. SEVERITY: "How bad is the pain?"  (Scale 1-10; or mild, moderate, severe)   - NO PAIN (0): no pain or only slight stiffness    - MILD (1-3): doesn't interfere with normal activities    - MODERATE (4-7): interferes with normal activities or awakens from sleep    - SEVERE (8-10):  excruciating pain, unable to do any normal activities      severe 5. RADIATION: "Does the pain go anywhere else, shoot into your arms?"     Into shoulder 6. CORD SYMPTOMS: "Any weakness or numbness of the arms or legs?"     no 7. CAUSE: "What do you think is causing the neck pain?"     Not sure 8. NECK OVERUSE: "Any recent activities that involved turning or twisting the neck?"     Woke that way 9. OTHER SYMPTOMS: "Do you have any other symptoms?" (e.g., headache, fever, chest pain, difficulty breathing, neck swelling)     no 10. PREGNANCY: "Is there any chance you are pregnant?" "When was your last menstrual period?"  Protocols used: Neck Pain or Stiffness-A-AH

## 2021-12-26 NOTE — Progress Notes (Unsigned)
Subjective:   Marc Bond is a 76 y.o. male who presents for Medicare Annual/Subsequent preventive examination.  I connected with  Marc Bond on 12/27/21 by an in person visit and verified that I am speaking with the correct person using two identifiers.  Patient Location: Other:  In Office  Provider Location: Office/Clinic  Review of Systems    Defer to PCP Cardiac Risk Factors include: advanced age (>32men, >69 women);male gender     Objective:    Today's Vitals   12/27/21 0943  BP: 106/78  Pulse: 94  Weight: 177 lb (80.3 kg)  Height: 5\' 10"  (1.778 m)  PainSc: 0-No pain   Body mass index is 25.4 kg/m.     12/27/2021    9:34 AM 12/26/2020   10:57 AM 11/09/2019   10:10 AM 10/20/2018    9:30 AM 10/16/2017   10:25 AM 09/29/2015    9:29 AM  Advanced Directives  Does Patient Have a Medical Advance Directive? No No No Yes Yes Yes  Type of 10/01/2015;Living will Healthcare Power of [place];Living will Healthcare Power of [place];Living will  Does patient want to make changes to medical advance directive?      No - Patient declined  Copy of Healthcare Power of Attorney in Chart?    No - copy requested No - copy requested   Would patient like information on creating a medical advance directive? Yes (Inpatient - patient defers creating a medical advance directive at this time - Information given) Yes (MAU/Ambulatory/Procedural Areas - Information given) Yes (MAU/Ambulatory/Procedural Areas - Information given)       Current Medications (verified) Outpatient Encounter Medications as of 12/27/2021  Medication Sig   alfuzosin (UROXATRAL) 10 MG 24 hr tablet Take 10 mg by mouth daily.   aspirin 81 MG chewable tablet Chew by mouth daily.   baclofen (LIORESAL) 10 MG tablet Take 1 tablet (10 mg total) by mouth 3 (three) times daily.   Blood Glucose Monitoring Suppl (GLUCOCOM BLOOD GLUCOSE MONITOR) DEVI as directed (Daily)   cholecalciferol  (VITAMIN D) 1000 units tablet Take 1,000 Units by mouth daily.   Cysteamine Bitartrate (PROCYSBI) 300 MG PACK Use 1 each 2 (two) times daily Use as instructed.   diclofenac (VOLTAREN) 50 MG EC tablet Take 50 mg by mouth 2 (two) times daily.   dicyclomine (BENTYL) 10 MG capsule Take 1 capsule (10 mg total) by mouth 4 (four) times daily -  before meals and at bedtime.   finasteride (PROSCAR) 5 MG tablet Take 5 mg by mouth daily.   glucose blood test strip 1 each by Other route 2 (two) times daily.   Lancets (ONETOUCH DELICA PLUS LANCET33G) MISC Apply topically 2 (two) times daily.   omeprazole (PRILOSEC) 40 MG capsule TAKE 1 CAPSULE BY MOUTH EVERY DAY   rosuvastatin (CRESTOR) 40 MG tablet TAKE 1 TABLET(40 MG) BY MOUTH DAILY   sitaGLIPtin-metformin (JANUMET) 50-1000 MG tablet Take 1 tablet by mouth 2 (two) times daily with a meal.   [DISCONTINUED] dicyclomine (BENTYL) 10 MG capsule Take 1 capsule (10 mg total) by mouth 4 (four) times daily -  before meals and at bedtime.   [DISCONTINUED] glucose blood test strip ONETOUCH VERIO (In Vitro Strip) - Historical Medication  1 daily Active   No facility-administered encounter medications on file as of 12/27/2021.    Allergies (verified) Ace inhibitors   History: Past Medical History:  Diagnosis Date   Bilateral cataracts    CKD (  chronic kidney disease) stage 3, GFR 30-59 ml/min (HCC)    Degenerative disc disease, lumbar    Diabetes mellitus without complication (HCC)    Enlarged prostate    GERD (gastroesophageal reflux disease)    Hyperlipidemia    Hypertension    IBS (irritable bowel syndrome)    Mood disorder ([facility]) 08/20/2016   Peptic ulcer disease    Primary osteoarthritis of right knee    Renal cyst, right    Past Surgical History:  Procedure Laterality Date   COLONOSCOPY  11/2011   normal - 10 yr follow up Dr. Epimenio Foot   PROSTATE SURGERY     Family History  Problem Relation Age of Onset   Hypertension Sister    Kidney disease  Mother    Hypertension Mother    Emphysema Father    Diabetes Brother    Diabetes Sister    Hypertension Sister    Diabetes Brother    Social History   Socioeconomic History   Marital status: Widowed    Spouse name: Not on file   Number of children: 2   Years of education: Not on file   Highest education level: 11th grade  Occupational History   Occupation: Retired  Tobacco Use   Smoking status: Former    Packs/day: 0.50    Years: 13.00    Total pack years: 6.50    Types: Cigarettes, Pipe, Cigars    Quit date: 04/16/1978    Years since quitting: 43.7   Smokeless tobacco: Never   Tobacco comments:    I quit smoking about 40 years ago.  Vaping Use   Vaping Use: Never used  Substance and Sexual Activity   Alcohol use: No   Drug use: Not Currently   Sexual activity: Yes  Other Topics Concern   Not on file  Social History Narrative   Pt's sister lives with him and 2 grandsons.    Social Determinants of Health   Financial Resource Strain: Low Risk  (12/27/2021)   Overall Financial Resource Strain (CARDIA)    Difficulty of Paying Living Expenses: Not hard at all  Food Insecurity: No Food Insecurity (12/27/2021)   Hunger Vital Sign    Worried About Running Out of Food in the Last Year: Never true    Ran Out of Food in the Last Year: Never true  Transportation Needs: No Transportation Needs (12/27/2021)   PRAPARE - Hydrologist (Medical): No    Lack of Transportation (Non-Medical): No  Physical Activity: Sufficiently Active (12/27/2021)   Exercise Vital Sign    Days of Exercise per Week: 7 days    Minutes of Exercise per Session: 30 min  Stress: No Stress Concern Present (12/27/2021)   [place]    Feeling of Stress : Not at all  Social Connections: Moderately Isolated (12/27/2021)   Social Connection and Isolation Panel [NHANES]    Frequency of Communication with Friends and  Family: Twice a week    Frequency of Social Gatherings with Friends and Family: More than three times a week    Attends Religious Services: More than 4 times per year    Active Member of [employer] or Organizations: No    Attends Archivist Meetings: Never    Marital Status: Widowed    Tobacco Counseling Counseling given: Yes Tobacco comments: I quit smoking about 40 years ago.   Clinical Intake:  Pre-visit preparation completed: Yes  Pain : No/denies  pain Pain Score: 0-No pain     BMI - recorded: 25.4 Nutritional Status: BMI 25 -29 Overweight Nutritional Risks: None Diabetes: Yes CBG done?: No Did pt. bring in CBG monitor from home?: No     Diabetic? Yes.  Interpreter Needed?: No  Information entered by :: Margaretha Sheffield, CMA   Activities of Daily Living    12/27/2021    9:35 AM  In your present state of health, do you have any difficulty performing the following activities:  Hearing? 0  Vision? 0  Difficulty concentrating or making decisions? 0  Walking or climbing stairs? 0  Dressing or bathing? 0  Doing errands, shopping? 0  Preparing Food and eating ? N  Using the Toilet? N  In the past six months, have you accidently leaked urine? N  Do you have problems with loss of bowel control? N  Managing your Medications? N  Managing your Finances? N  Housekeeping or managing your Housekeeping? N    Patient Care Team: Reubin Milan, MD as PCP - General (Internal Medicine) Victorino December, MD as Referring Physician (Pulmonary Disease) Jeronimo Norma (Inactive) as Physician Assistant (Endocrinology) Jacquiline Doe, MD as Referring Physician (Urology) Arrie Senate, MD as Referring Physician (Cardiology)  Indicate any recent Medical Services you may have received from other than [facility] providers in the past year (date may be approximate).     Assessment:   This is a routine wellness examination for Versie.  Hearing/Vision  screen Hearing Screening - Comments:: No concerns. Vision Screening - Comments:: Wears prescriptions glasses.  Dietary issues and exercise activities discussed: Current Exercise Habits: Home exercise routine, Type of exercise: walking, Time (Minutes): 30, Frequency (Times/Week): >7, Weekly Exercise (Minutes/Week): 0, Intensity: Moderate   Goals Addressed   None   Depression Screen    12/27/2021    9:28 AM 12/27/2021    9:03 AM 07/18/2021    9:28 AM 12/26/2020   10:56 AM 12/06/2020   11:35 AM 09/28/2020   11:03 AM 07/13/2020    9:53 AM  PHQ 2/9 Scores  PHQ - 2 Score 0 0 0 0 0 3 0  PHQ- 9 Score 0 0 0 0 0 5 0    Fall Risk    12/27/2021    9:35 AM 12/27/2021    9:03 AM 07/18/2021    9:28 AM 12/26/2020   10:58 AM 12/06/2020   11:35 AM  Fall Risk   Falls in the past year? 0 0 0 0 0  Number falls in past yr: 0 0 0 0 0  Injury with Fall? 0 0 0 0 0  Risk for fall due to : No Fall Risks No Fall Risks No Fall Risks No Fall Risks No Fall Risks  Follow up Falls evaluation completed Falls evaluation completed Falls evaluation completed Falls prevention discussed Falls evaluation completed    FALL RISK PREVENTION PERTAINING TO THE HOME:  Any stairs in or around the home? No  If so, are there any without handrails?  N/A Home free of loose throw rugs in walkways, pet beds, electrical cords, etc? Yes  Adequate lighting in your home to reduce risk of falls? Yes   ASSISTIVE DEVICES UTILIZED TO PREVENT FALLS:  Life alert? No  Use of a cane, walker or w/c? No  Grab bars in the bathroom? Yes  Shower chair or bench in shower? No  Elevated toilet seat or a handicapped toilet? Yes   TIMED UP AND GO:  Was the test performed? Yes .  Gait steady and fast without use of assistive device  Cognitive Function:        12/27/2021    9:36 AM 11/09/2019   10:13 AM 10/20/2018    9:36 AM 10/16/2017   10:29 AM  6CIT Screen  What Year? 0 points 0 points 0 points 0 points  What month? 0 points 0 points 0  points 0 points  What time? 0 points 0 points 0 points 0 points  Count back from 20 0 points 0 points 0 points 0 points  Months in reverse 0 points 0 points 0 points 0 points  Repeat phrase 6 points 0 points 4 points 10 points  Total Score 6 points 0 points 4 points 10 points    Immunizations Immunization History  Administered Date(s) Administered   Fluad Quad(high Dose 65+) 12/30/2018, 01/07/2020, 12/26/2020, 12/27/2021   Influenza, High Dose Seasonal PF 12/19/2017   Influenza,inj,Quad PF,6+ Mos 12/26/2016   Influenza-Unspecified 02/16/2015, 12/26/2016   Moderna Sars-Covid-2 Vaccination 05/09/2019, 06/09/2019, 02/09/2020   Pneumococcal Conjugate-13 09/29/2015, 03/27/2017   Pneumococcal Polysaccharide-23 04/17/2008, 12/04/2012   Tdap 10/24/2017   Zoster Recombinat (Shingrix) 01/22/2018, 03/25/2018    TDAP status: Up to date  Flu Vaccine status: Completed at today's visit  Pneumococcal vaccine status: Up to date  Covid-19 vaccine status: Completed vaccines  Qualifies for Shingles Vaccine? Yes   Zostavax completed No   Shingrix Completed?: Yes  Screening Tests Health Maintenance  Topic Date Due   URINE MICROALBUMIN  02/09/2022   COVID-19 Vaccine (4 - Moderna risk series) 01/11/2022 (Originally 04/05/2020)   HEMOGLOBIN A1C  04/12/2022   OPHTHALMOLOGY EXAM  05/30/2022   FOOT EXAM  07/19/2022   TETANUS/TDAP  10/25/2027   Pneumonia Vaccine 31+ Years old  Completed   INFLUENZA VACCINE  Completed   Zoster Vaccines- Shingrix  Completed   Hepatitis C Screening  Addressed   HPV VACCINES  Aged Out   COLONOSCOPY (Pts 45-54yrs Insurance coverage will need to be confirmed)  Discontinued    Health Maintenance  Health Maintenance Due  Topic Date Due   URINE MICROALBUMIN  02/09/2022    Colorectal cancer screening: Type of screening: Colonoscopy. Completed 11/26/2011. Repeat every 0 years. No longer required.   Lung Cancer Screening: (Low Dose CT Chest recommended if Age  24-80 years, 30 pack-year currently smoking OR have quit w/in 15years.) does not qualify.   Lung Cancer Screening Referral: N/A   Additional Screening:  Hepatitis C Screening: does qualify; Completed 02/21/2012  Vision Screening: Recommended annual ophthalmology exams for early detection of glaucoma and other disorders of the eye. Is the patient up to date with their annual eye exam?  Yes  Who is the provider or what is the name of the office in which the patient attends annual eye exams? [facility] If pt is not established with a provider, would they like to be referred to a provider to establish care?  N/A .   Dental Screening: Recommended annual dental exams for proper oral hygiene  Community Resource Referral / Chronic Care Management: CRR required this visit?  No   CCM required this visit?  No      Plan:     I have personally reviewed and noted the following in the patient's chart:   Medical and social history Use of alcohol, tobacco or illicit drugs  Current medications and supplements including opioid prescriptions. Patient is not currently taking opioid prescriptions. Functional ability and status Nutritional status Physical activity Advanced directives List of other physicians  Hospitalizations, surgeries, and ER visits in previous 12 months Vitals Screenings to include cognitive, depression, and falls Referrals and appointments  In addition, I have reviewed and discussed with patient certain preventive protocols, quality metrics, and best practice recommendations. A written personalized care plan for preventive services as well as general preventive health recommendations were provided to patient.     Clista Bernhardt, [place]   12/27/2021   Nurse Notes: None.

## 2021-12-27 ENCOUNTER — Encounter: Payer: Self-pay | Admitting: Internal Medicine

## 2021-12-27 ENCOUNTER — Ambulatory Visit (INDEPENDENT_AMBULATORY_CARE_PROVIDER_SITE_OTHER): Payer: Medicare Other | Admitting: Internal Medicine

## 2021-12-27 ENCOUNTER — Ambulatory Visit (INDEPENDENT_AMBULATORY_CARE_PROVIDER_SITE_OTHER): Payer: Medicare Other

## 2021-12-27 ENCOUNTER — Other Ambulatory Visit: Payer: Self-pay | Admitting: Internal Medicine

## 2021-12-27 VITALS — BP 106/78 | HR 94 | Ht 70.0 in | Wt 177.0 lb

## 2021-12-27 DIAGNOSIS — M62838 Other muscle spasm: Secondary | ICD-10-CM | POA: Diagnosis not present

## 2021-12-27 DIAGNOSIS — Z0000 Encounter for general adult medical examination without abnormal findings: Secondary | ICD-10-CM | POA: Diagnosis not present

## 2021-12-27 DIAGNOSIS — E118 Type 2 diabetes mellitus with unspecified complications: Secondary | ICD-10-CM

## 2021-12-27 DIAGNOSIS — Z23 Encounter for immunization: Secondary | ICD-10-CM | POA: Diagnosis not present

## 2021-12-27 DIAGNOSIS — K5901 Slow transit constipation: Secondary | ICD-10-CM

## 2021-12-27 MED ORDER — GLUCOSE BLOOD VI STRP: 1.0000 | ORAL_STRIP | Freq: Two times a day (BID) | 3 refills | Status: DC

## 2021-12-27 MED ORDER — DICYCLOMINE HCL 10 MG PO CAPS: 10.0000 mg | ORAL_CAPSULE | Freq: Three times a day (TID) | ORAL | 0 refills | Status: DC

## 2021-12-27 MED ORDER — BACLOFEN 10 MG PO TABS: 10.0000 mg | ORAL_TABLET | Freq: Three times a day (TID) | ORAL | 0 refills | Status: DC

## 2021-12-27 NOTE — Patient Instructions (Signed)
Tylenol AS - take 2 three times a day  Use heat on your neck and shoulder three times a day for 30 minutes  Take Baclofen 10 mg three times a day

## 2021-12-27 NOTE — Progress Notes (Signed)
Date:  12/27/2021   Name:  Marc Bond   DOB:  01/04/1946   MRN:  [id number]   Chief Complaint: Neck Pain and Flu Vaccine  Neck Pain  This is a recurrent problem. The current episode started more than 1 year ago. The problem occurs constantly. The problem has been unchanged. The pain is associated with nothing. The pain is present in the right side. The quality of the pain is described as aching. The pain is at a severity of 8/10. The pain is mild. Nothing aggravates the symptoms. The pain is Worse during the night. Stiffness is present All day. Associated symptoms include weight loss. Pertinent negatives include no chest pain, fever, numbness or weakness. He has tried acetaminophen (Flexiril) for the symptoms. The treatment provided no relief.    Lab Results  Component Value Date   NA 138 07/18/2021   K 5.2 07/18/2021   CO2 20 07/18/2021   GLUCOSE 112 (H) 07/18/2021   BUN 22 07/18/2021   CREATININE 1.49 (H) 07/18/2021   CALCIUM 10.3 (H) 07/18/2021   EGFR 48 (L) 07/18/2021   GFRNONAA 40 (L) 02/10/2020   Lab Results  Component Value Date   CHOL 134 07/18/2021   HDL 52 07/18/2021   LDLCALC 67 07/18/2021   TRIG 73 07/18/2021   CHOLHDL 2.6 07/18/2021   Lab Results  Component Value Date   TSH 1.930 07/18/2021   Lab Results  Component Value Date   HGBA1C 7.6 10/11/2021   Lab Results  Component Value Date   WBC 8.5 07/18/2021   HGB 12.9 (L) 07/18/2021   HCT 37.4 (L) 07/18/2021   MCV 80 07/18/2021   PLT 222 07/18/2021   Lab Results  Component Value Date   ALT 19 07/18/2021   AST 18 07/18/2021   ALKPHOS 74 07/18/2021   BILITOT 0.4 07/18/2021   Lab Results  Component Value Date   VD25OH 65.0 02/10/2020     Review of Systems  Constitutional:  Positive for weight loss. Negative for chills, fatigue and fever.  Respiratory:  Negative for chest tightness and shortness of breath.   Cardiovascular:  Negative for chest pain.  Musculoskeletal:  Positive for neck pain  and neck stiffness.  Neurological:  Negative for tremors, weakness and numbness.  Psychiatric/Behavioral:  Negative for dysphoric mood and sleep disturbance. The patient is not nervous/anxious.     Patient Active Problem List   Diagnosis Date Noted   1st degree AV block 05/31/2020   CKD (chronic kidney disease) stage 3, GFR 30-59 ml/min (HCC) 10/13/2019   Ulnar impingement syndrome, left 10/27/2018   Renal cyst, right 10/29/2016   Pulmonary cavitary lesion 10/16/2016   PPD positive, treated 10/16/2016   Degenerative disc disease, lumbar 09/29/2015   Primary osteoarthritis of right knee 09/29/2015   Irritable colon 03/12/2015   Type II diabetes mellitus with complication ([place]) 45/62/5638   Hyperlipidemia associated with type 2 diabetes mellitus ([place]) 03/12/2015   BPH with obstruction/lower urinary tract symptoms 03/12/2015   Failure of erection 03/12/2015   Seborrhea capitis 03/12/2015   Peptic ulcer disease 03/12/2015   Acquired deformities of toe 03/03/2012   Bilateral cataracts 12/10/2011    Allergies  Allergen Reactions   Ace Inhibitors Other (See Comments)    Sx hypotension on low dose    Past Surgical History:  Procedure Laterality Date   COLONOSCOPY  11/2011   normal - 10 yr follow up Dr. Epimenio Foot   PROSTATE SURGERY      Social History  Tobacco Use   Smoking status: Former    Packs/day: 0.50    Years: 13.00    Total pack years: 6.50    Types: Cigarettes, Pipe, Cigars    Quit date: 04/16/1978    Years since quitting: 43.7   Smokeless tobacco: Never   Tobacco comments:    I quit smoking about 40 years ago.  Vaping Use   Vaping Use: Never used  Substance Use Topics   Alcohol use: No   Drug use: Not Currently     Medication list has been reviewed and updated.  Current Meds  Medication Sig   alfuzosin (UROXATRAL) 10 MG 24 hr tablet Take 10 mg by mouth daily.   aspirin 81 MG chewable tablet Chew by mouth daily.   Blood Glucose Monitoring Suppl (GLUCOCOM  BLOOD GLUCOSE MONITOR) DEVI as directed (Daily)   cholecalciferol (VITAMIN D) 1000 units tablet Take 1,000 Units by mouth daily.   Cysteamine Bitartrate (PROCYSBI) 300 MG PACK Use 1 each 2 (two) times daily Use as instructed.   diclofenac (VOLTAREN) 50 MG EC tablet Take 50 mg by mouth 2 (two) times daily.   dicyclomine (BENTYL) 10 MG capsule Take 1 capsule (10 mg total) by mouth 4 (four) times daily -  before meals and at bedtime.   finasteride (PROSCAR) 5 MG tablet Take 5 mg by mouth daily.   glucose blood test strip ONETOUCH VERIO (In Vitro Strip) - Historical Medication  1 daily Active   Lancets (ONETOUCH DELICA PLUS [id number]) MISC Apply topically 2 (two) times daily.   omeprazole (PRILOSEC) 40 MG capsule TAKE 1 CAPSULE BY MOUTH EVERY DAY   rosuvastatin (CRESTOR) 40 MG tablet TAKE 1 TABLET(40 MG) BY MOUTH DAILY   sitaGLIPtin-metformin (JANUMET) 50-1000 MG tablet Take 1 tablet by mouth 2 (two) times daily with a meal.   [DISCONTINUED] simvastatin (ZOCOR) 40 MG tablet        12/27/2021    9:03 AM 07/18/2021    9:28 AM 12/06/2020   11:35 AM 09/28/2020   11:04 AM  GAD 7 : Generalized Anxiety Score  Nervous, Anxious, on Edge 0 0 0 0  Control/stop worrying 0 0 0 0  Worry too much - different things 0 0 0 0  Trouble relaxing 0 0 0 0  Restless 0 0 0 0  Easily annoyed or irritable 0 0 0 0  Afraid - awful might happen 0 0 0 0  Total GAD 7 Score 0 0 0 0  Anxiety Difficulty Not difficult at all Not difficult at all         12/27/2021    9:03 AM 07/18/2021    9:28 AM 12/26/2020   10:56 AM  Depression screen PHQ 2/9  Decreased Interest 0 0 0  Down, Depressed, Hopeless 0 0 0  PHQ - 2 Score 0 0 0  Altered sleeping 0 0 0  Tired, decreased energy 0 0 0  Change in appetite 0 0 0  Feeling bad or failure about yourself  0 0 0  Trouble concentrating 0 0 0  Moving slowly or fidgety/restless 0 0 0  Suicidal thoughts 0 0 0  PHQ-9 Score 0 0 0  Difficult doing work/chores Not difficult at all Not  difficult at all Not difficult at all    BP Readings from Last 3 Encounters:  12/27/21 106/78  07/18/21 128/72  12/26/20 120/80    Physical Exam Vitals and nursing note reviewed.  Constitutional:      General: He is not in  acute distress.    Appearance: Normal appearance. He is well-developed.  HENT:     Head: Normocephalic and atraumatic.  Cardiovascular:     Rate and Rhythm: Normal rate and regular rhythm.  Pulmonary:     Effort: Pulmonary effort is normal. No respiratory distress.     Breath sounds: No wheezing or rhonchi.  Musculoskeletal:     Cervical back: Spasms present. Pain with movement present. Decreased range of motion.  Skin:    General: Skin is warm and dry.     Findings: No rash.  Neurological:     Mental Status: He is alert and oriented to person, place, and time.     Sensory: Sensation is intact.     Motor: Motor function is intact.     Deep Tendon Reflexes:     Reflex Scores:      Bicep reflexes are 1+ on the right side and 1+ on the left side. Psychiatric:        Mood and Affect: Mood normal.        Behavior: Behavior normal.     Wt Readings from Last 3 Encounters:  12/27/21 177 lb (80.3 kg)  07/18/21 182 lb (82.6 kg)  12/26/20 184 lb 3.2 oz (83.6 kg)    BP 106/78   Pulse 94   Ht '5\' 10"'  (1.778 m)   Wt 177 lb (80.3 kg)   SpO2 98%   BMI 25.40 kg/m   Assessment and Plan: 1. Neck muscle spasm Recommend Tylenol, heat and baclofen tid - baclofen (LIORESAL) 10 MG tablet; Take 1 tablet (10 mg total) by mouth 3 (three) times daily.  Dispense: 30 each; Refill: 0  2. Type II diabetes mellitus with complication (HCC) Continue follow up with Endo - glucose blood test strip; 1 each by Other route 2 (two) times daily.  Dispense: 100 each; Refill: 3  3. Need for immunization against influenza - Flu Vaccine QUAD High Dose(Fluad)  4. Slow transit constipation - dicyclomine (BENTYL) 10 MG capsule; Take 1 capsule (10 mg total) by mouth 4 (four) times  daily -  before meals and at bedtime.  Dispense: 270 capsule; Refill: 0   Partially dictated using Editor, commissioning. Any errors are unintentional.  Halina Maidens, MD [facility]  12/27/2021

## 2021-12-28 ENCOUNTER — Telehealth: Payer: Self-pay | Admitting: Internal Medicine

## 2021-12-28 ENCOUNTER — Other Ambulatory Visit: Payer: Self-pay

## 2021-12-28 DIAGNOSIS — E118 Type 2 diabetes mellitus with unspecified complications: Secondary | ICD-10-CM

## 2021-12-28 MED ORDER — GLUCOSE BLOOD VI STRP: 1.0000 | ORAL_STRIP | Freq: Two times a day (BID) | 3 refills | Status: DC

## 2021-12-28 NOTE — Telephone Encounter (Signed)
'  Marc Bond' from [facility] calling. States they need prior authorization for test strips for Medicare to cover. States pt has been in "All week" and need this ASAP.

## 2021-12-28 NOTE — Telephone Encounter (Signed)
Copied from CRM [phone]. Topic: General - Inquiry >> Dec 28, 2021  9:26 AM Payton Doughty wrote: Reason for CRM: pt went to pharmacy and they told him there is a problem w/ Medicare paying for his test strips. Something about the way Rx sent in.  Pt states he is completely out of strips.

## 2021-12-28 NOTE — Telephone Encounter (Signed)
Spoke to pt we do not have a updated medicare card. Pt will try to send a picture of his card thru Mychart.  KP

## 2021-12-28 NOTE — Telephone Encounter (Signed)
Noted  Sent pt the fax number thru Mychart.  KP

## 2021-12-28 NOTE — Telephone Encounter (Signed)
Unable to refill per protocol, last refill by  Provider 12/27/21 for 270 days and 0 rf. E-Prescribing Status: Receipt confirmed by pharmacy (12/27/2021 9:15 AM .Will refuse this duplicate request.  Requested Prescriptions  Pending Prescriptions Disp Refills  . dicyclomine (BENTYL) 10 MG capsule [Pharmacy Med Name: DICYCLOMINE 10MG  CAPSULES] 362 capsule     Sig: TAKE 1 CAPSULE(10 MG) BY MOUTH FOUR TIMES DAILY BEFORE MEALS AND AT BEDTIME     Gastroenterology:  Antispasmodic Agents Passed - 12/27/2021 10:12 AM      Passed - Valid encounter within last 12 months    Recent Outpatient Visits          Yesterday Neck muscle spasm   [place] [facility] at [facility], [facility], MD   5 months ago Hyperlipidemia associated with type 2 diabetes mellitus [facility])   [place] [facility] at [facility], [facility], MD   1 year ago Acute otitis externa of right ear, unspecified type   [place] [facility] at [facility], [facility], MD   1 year ago Left hip pain   [place] [facility] at [facility], [facility], MD   1 year ago Hyperlipidemia associated with type 2 diabetes mellitus [facility])   [place] [facility] at [facility], [facility], MD      Future Appointments            In 6 months Nyoka Cowden, Judithann Graves, MD [facility] at [facility], [facility]

## 2021-12-28 NOTE — Telephone Encounter (Signed)
Copied from CRM [id number]. Topic: General - Other >> Dec 28, 2021  3:09 PM Ja-Kwan M wrote: Reason for CRM: Pt girlfriend Suzzette Righter called to report that she does not know how to send the picture through [facility] but as soon as the pt brings it to her she will send it in by fax.

## 2021-12-28 NOTE — Telephone Encounter (Signed)
Resent test strips with diagnosis code attached.

## 2022-03-28 ENCOUNTER — Other Ambulatory Visit: Payer: Self-pay

## 2022-03-28 DIAGNOSIS — E118 Type 2 diabetes mellitus with unspecified complications: Secondary | ICD-10-CM

## 2022-03-28 MED ORDER — GLUCOSE BLOOD VI STRP: 1.0000 | ORAL_STRIP | Freq: Two times a day (BID) | 3 refills | Status: DC

## 2022-05-10 LAB — HEMOGLOBIN A1C: Hemoglobin A1C: 7.5

## 2022-05-10 LAB — MICROALBUMIN / CREATININE URINE RATIO: Microalb Creat Ratio: 15

## 2022-05-10 LAB — MICROALBUMIN, URINE: Microalb, Ur: 11

## 2022-06-21 ENCOUNTER — Other Ambulatory Visit: Payer: Self-pay | Admitting: Internal Medicine

## 2022-06-21 DIAGNOSIS — K5901 Slow transit constipation: Secondary | ICD-10-CM

## 2022-06-26 LAB — HM DIABETES EYE EXAM

## 2022-07-06 ENCOUNTER — Other Ambulatory Visit: Payer: Self-pay | Admitting: Internal Medicine

## 2022-07-06 DIAGNOSIS — E118 Type 2 diabetes mellitus with unspecified complications: Secondary | ICD-10-CM

## 2022-07-19 ENCOUNTER — Other Ambulatory Visit: Payer: Self-pay | Admitting: Internal Medicine

## 2022-07-19 DIAGNOSIS — E118 Type 2 diabetes mellitus with unspecified complications: Secondary | ICD-10-CM

## 2022-07-19 NOTE — Telephone Encounter (Signed)
Medication Refill - Medication: glucose blood (ONETOUCH VERIO) test strip [id number]   Has the patient contacted their pharmacy? Yes.    (Agent: If yes, when and what did the pharmacy advise?) Contact PCP   Preferred Pharmacy (with phone number or street name): [place], [place] [facility] [address]   Has the patient been seen for an appointment in the last year OR does the patient have an upcoming appointment? Yes.    Agent: Please be advised that RX refills may take up to 3 business days. We ask that you follow-up with your pharmacy.   Pt says a prescription for the diabetic test meter was sent in instead of the strips, and pt needs the strips.

## 2022-07-19 NOTE — Telephone Encounter (Signed)
Refilled 07/06/22 #100 with 3 refills. Requested Prescriptions  Refused Prescriptions Disp Refills   glucose blood (ONETOUCH VERIO) test strip 100 strip 3    Sig: Use as instructed     Endocrinology: Diabetes - Testing Supplies Failed - 07/19/2022  2:12 PM      Failed - Valid encounter within last 12 months    Recent Outpatient Visits           6 months ago Neck muscle spasm   [place] [facility] at [facility], Jesse Sans, MD   1 year ago Hyperlipidemia associated with type 2 diabetes mellitus [facility])   [place] [facility] at [facility], Jesse Sans, MD   1 year ago Acute otitis externa of right ear, unspecified type   [facility] at [facility], Jesse Sans, MD   1 year ago Left hip pain   [place] [facility] at [facility], Jesse Sans, MD   2 years ago Hyperlipidemia associated with type 2 diabetes mellitus [facility])   [place] at [facility], Jesse Sans, MD       Future Appointments             In 4 days Army Melia Jesse Sans, MD [place] at [facility], [facility]

## 2022-07-20 ENCOUNTER — Encounter: Payer: Medicare Other | Admitting: Internal Medicine

## 2022-07-23 ENCOUNTER — Encounter: Payer: Self-pay | Admitting: Internal Medicine

## 2022-07-23 ENCOUNTER — Ambulatory Visit (INDEPENDENT_AMBULATORY_CARE_PROVIDER_SITE_OTHER): Payer: Medicare Other | Admitting: Internal Medicine

## 2022-07-23 VITALS — BP 100/70 | HR 107 | Ht 70.0 in | Wt 187.6 lb

## 2022-07-23 DIAGNOSIS — E785 Hyperlipidemia, unspecified: Secondary | ICD-10-CM

## 2022-07-23 DIAGNOSIS — E1169 Type 2 diabetes mellitus with other specified complication: Secondary | ICD-10-CM

## 2022-07-23 DIAGNOSIS — E118 Type 2 diabetes mellitus with unspecified complications: Secondary | ICD-10-CM | POA: Diagnosis not present

## 2022-07-23 DIAGNOSIS — N138 Other obstructive and reflux uropathy: Secondary | ICD-10-CM

## 2022-07-23 DIAGNOSIS — N401 Enlarged prostate with lower urinary tract symptoms: Secondary | ICD-10-CM

## 2022-07-23 DIAGNOSIS — N1832 Chronic kidney disease, stage 3b: Secondary | ICD-10-CM | POA: Diagnosis not present

## 2022-07-23 DIAGNOSIS — K279 Peptic ulcer, site unspecified, unspecified as acute or chronic, without hemorrhage or perforation: Secondary | ICD-10-CM | POA: Diagnosis not present

## 2022-07-23 DIAGNOSIS — M1711 Unilateral primary osteoarthritis, right knee: Secondary | ICD-10-CM

## 2022-07-23 MED ORDER — ONETOUCH VERIO VI STRP: ORAL_STRIP | 3 refills | Status: AC

## 2022-07-23 MED ORDER — OMEPRAZOLE 40 MG PO CPDR: DELAYED_RELEASE_CAPSULE | ORAL | 3 refills | Status: DC

## 2022-07-23 NOTE — Assessment & Plan Note (Addendum)
Crestor stopped months ago due to low bp and lightheadedness LDL is  Lab Results  Component Value Date   LDLCALC 67 07/18/2021   with a goal of < 70. Will likely need to start medications - suggest atorvastatin 10 mg

## 2022-07-23 NOTE — Assessment & Plan Note (Signed)
Symptoms well controlled on daily PPI No red flag signs such as weight loss, n/v, melena  

## 2022-07-23 NOTE — Assessment & Plan Note (Signed)
Followed by Urology Having LUTS and needs surgery but he does not want the recommended surgery

## 2022-07-23 NOTE — Assessment & Plan Note (Addendum)
S/p Synovisc injections and doing well Takes diclofenac daily as well

## 2022-07-23 NOTE — Assessment & Plan Note (Signed)
Clinically stable without s/s of hypoglycemia. Tolerating janumet well without side effects or other concerns. Lab Results  Component Value Date   HGBA1C 7.6 10/11/2021

## 2022-07-23 NOTE — Progress Notes (Signed)
Date:  07/23/2022   Name:  Marc Bond   DOB:  February 18, 1946   MRN:  [id number]   Chief Complaint: Annual Exam (Foot exam.) Marc Bond is a 77 y.o. male who presents today for his Complete Annual Exam. He feels well. He reports exercising - walking. He reports he is sleeping well.   Colonoscopy: 2013 aged out  Immunization History  Administered Date(s) Administered   Fluad Quad(high Dose 65+) 12/30/2018, 01/07/2020, 12/26/2020, 12/27/2021   Influenza, High Dose Seasonal PF 12/19/2017   Influenza,inj,Quad PF,6+ Mos 12/26/2016   Influenza-Unspecified 02/16/2015, 12/26/2016   Moderna Sars-Covid-2 Vaccination 05/09/2019, 06/09/2019, 02/09/2020   Pneumococcal Conjugate-13 09/29/2015, 03/27/2017   Pneumococcal Polysaccharide-23 04/17/2008, 12/04/2012   Tdap 10/24/2017   Zoster Recombinat (Shingrix) 01/22/2018, 03/25/2018   Health Maintenance Due  Topic Date Due   COVID-19 Vaccine (4 - 2023-24 season) 12/15/2021   OPHTHALMOLOGY EXAM  05/30/2022   Diabetic kidney evaluation - eGFR measurement  07/19/2022    Lab Results  Component Value Date   PSA1 2.2 07/18/2021   PSA1 2.7 05/11/2019   PSA1 1.6 09/29/2015    Diabetes He presents for his follow-up diabetic visit. He has type 2 diabetes mellitus. His disease course has been stable. Pertinent negatives for hypoglycemia include no dizziness, headaches or nervousness/anxiousness. Pertinent negatives for diabetes include no chest pain and no fatigue.  Hyperlipidemia This is a chronic problem. The problem is controlled. Pertinent negatives include no chest pain, myalgias or shortness of breath. He is currently on no antihyperlipidemic treatment.  Gastroesophageal Reflux He complains of heartburn. He reports no abdominal pain, no chest pain, no choking or no wheezing. This is a recurrent problem. The problem occurs rarely. Pertinent negatives include no fatigue. He has tried a PPI for the symptoms.    Lab Results  Component Value Date    NA 138 07/18/2021   K 5.2 07/18/2021   CO2 20 07/18/2021   GLUCOSE 112 (H) 07/18/2021   BUN 22 07/18/2021   CREATININE 1.49 (H) 07/18/2021   CALCIUM 10.3 (H) 07/18/2021   EGFR 48 (L) 07/18/2021   GFRNONAA 40 (L) 02/10/2020   Lab Results  Component Value Date   CHOL 134 07/18/2021   HDL 52 07/18/2021   LDLCALC 67 07/18/2021   TRIG 73 07/18/2021   CHOLHDL 2.6 07/18/2021   Lab Results  Component Value Date   TSH 1.930 07/18/2021   Lab Results  Component Value Date   HGBA1C 7.5 05/10/2022   Lab Results  Component Value Date   WBC 8.5 07/18/2021   HGB 12.9 (L) 07/18/2021   HCT 37.4 (L) 07/18/2021   MCV 80 07/18/2021   PLT 222 07/18/2021   Lab Results  Component Value Date   ALT 19 07/18/2021   AST 18 07/18/2021   ALKPHOS 74 07/18/2021   BILITOT 0.4 07/18/2021   Lab Results  Component Value Date   VD25OH 65.0 02/10/2020     Review of Systems  Constitutional:  Negative for appetite change, chills, diaphoresis, fatigue and unexpected weight change.  HENT:  Negative for hearing loss, tinnitus, trouble swallowing and voice change.   Eyes:  Negative for visual disturbance.  Respiratory:  Negative for choking, shortness of breath and wheezing.   Cardiovascular:  Negative for chest pain, palpitations and leg swelling.  Gastrointestinal:  Positive for heartburn. Negative for abdominal pain, blood in stool, constipation and diarrhea.  Genitourinary:  Negative for difficulty urinating, dysuria and frequency.  Musculoskeletal:  Negative for arthralgias, back pain and myalgias.  Skin:  Negative for color change and rash.  Neurological:  Negative for dizziness, syncope and headaches.  Hematological:  Negative for adenopathy.  Psychiatric/Behavioral:  Negative for dysphoric mood and sleep disturbance. The patient is not nervous/anxious.     Patient Active Problem List   Diagnosis Date Noted   1st degree AV block 05/31/2020   Stage 3b chronic kidney disease 10/13/2019    Ulnar impingement syndrome, left 10/27/2018   Renal cyst, right 10/29/2016   Pulmonary cavitary lesion 10/16/2016   PPD positive, treated 10/16/2016   Degenerative disc disease, lumbar 09/29/2015   Primary osteoarthritis of right knee 09/29/2015   Irritable colon 03/12/2015   Type II diabetes mellitus with complication 03/12/2015   Hyperlipidemia associated with type 2 diabetes mellitus 03/12/2015   BPH with obstruction/lower urinary tract symptoms 03/12/2015   Failure of erection 03/12/2015   Seborrhea capitis 03/12/2015   Peptic ulcer disease 03/12/2015   Acquired deformities of toe 03/03/2012   Bilateral cataracts 12/10/2011    Allergies  Allergen Reactions   Ace Inhibitors Other (See Comments)    Sx hypotension on low dose    Past Surgical History:  Procedure Laterality Date   COLONOSCOPY  11/2011   normal - 10 yr follow up Dr. Earlean Polka   PROSTATE SURGERY      Social History   Tobacco Use   Smoking status: Former    Packs/day: 0.50    Years: 13.00    Additional pack years: 0.00    Total pack years: 6.50    Types: Cigarettes, Pipe, Cigars    Quit date: 04/16/1978    Years since quitting: 44.2   Smokeless tobacco: Never   Tobacco comments:    I quit smoking about 40 years ago.  Vaping Use   Vaping Use: Never used  Substance Use Topics   Alcohol use: No   Drug use: Not Currently     Medication list has been reviewed and updated.  Current Meds  Medication Sig   aspirin 81 MG chewable tablet Chew by mouth daily.   Blood Glucose Monitoring Suppl (GLUCOCOM BLOOD GLUCOSE MONITOR) DEVI as directed (Daily)   cholecalciferol (VITAMIN D) 1000 units tablet Take 1,000 Units by mouth daily.   diclofenac (VOLTAREN) 50 MG EC tablet Take 50 mg by mouth 2 (two) times daily.   dicyclomine (BENTYL) 10 MG capsule TAKE 1 CAPSULE(10 MG) BY MOUTH FOUR TIMES DAILY BEFORE MEALS AND AT BEDTIME   finasteride (PROSCAR) 5 MG tablet Take 5 mg by mouth daily.   Lancets (ONETOUCH DELICA  PLUS LANCET33G) MISC Apply topically 2 (two) times daily.   sitaGLIPtin-metformin (JANUMET) 50-1000 MG tablet Take 1 tablet by mouth 2 (two) times daily with a meal.   [DISCONTINUED] glucose blood (ONETOUCH VERIO) test strip USE 1 TEST STRIP TWICE DAILY   [DISCONTINUED] omeprazole (PRILOSEC) 40 MG capsule TAKE 1 CAPSULE BY MOUTH EVERY DAY       07/23/2022    9:57 AM 12/27/2021    9:03 AM 07/18/2021    9:28 AM 12/06/2020   11:35 AM  GAD 7 : Generalized Anxiety Score  Nervous, Anxious, on Edge 0 0 0 0  Control/stop worrying 0 0 0 0  Worry too much - different things 0 0 0 0  Trouble relaxing 0 0 0 0  Restless 0 0 0 0  Easily annoyed or irritable 0 0 0 0  Afraid - awful might happen 0 0 0 0  Total GAD 7 Score 0 0 0 0  Anxiety  Difficulty Not difficult at all Not difficult at all Not difficult at all        07/23/2022    9:56 AM 12/27/2021    9:28 AM 12/27/2021    9:03 AM  Depression screen PHQ 2/9  Decreased Interest 0  0  Down, Depressed, Hopeless 0 0 0  PHQ - 2 Score 0 0 0  Altered sleeping 0 0 0  Tired, decreased energy 0 0 0  Change in appetite 0 0 0  Feeling bad or failure about yourself  0 0 0  Trouble concentrating 0 0 0  Moving slowly or fidgety/restless 0 0 0  Suicidal thoughts 0 0 0  PHQ-9 Score 0 0 0  Difficult doing work/chores Not difficult at all Not difficult at all Not difficult at all    BP Readings from Last 3 Encounters:  07/23/22 100/70  12/27/21 106/78  12/27/21 106/78    Physical Exam Vitals and nursing note reviewed.  Constitutional:      Appearance: Normal appearance. He is well-developed.  HENT:     Head: Normocephalic.     Right Ear: Tympanic membrane, ear canal and external ear normal.     Left Ear: Tympanic membrane, ear canal and external ear normal.     Nose: Nose normal.  Eyes:     Conjunctiva/sclera: Conjunctivae normal.     Pupils: Pupils are equal, round, and reactive to light.  Neck:     Thyroid: No thyromegaly.     Vascular: No  carotid bruit.  Cardiovascular:     Rate and Rhythm: Normal rate and regular rhythm.     Heart sounds: Normal heart sounds.  Pulmonary:     Effort: Pulmonary effort is normal.     Breath sounds: Normal breath sounds. No wheezing.  Chest:  Breasts:    Right: No mass.     Left: No mass.  Abdominal:     General: Bowel sounds are normal.     Palpations: Abdomen is soft.     Tenderness: There is no abdominal tenderness.  Musculoskeletal:        General: Normal range of motion.     Cervical back: Normal range of motion and neck supple.  Lymphadenopathy:     Cervical: No cervical adenopathy.  Skin:    General: Skin is warm and dry.  Neurological:     Mental Status: He is alert and oriented to person, place, and time.     Deep Tendon Reflexes: Reflexes are normal and symmetric.  Psychiatric:        Attention and Perception: Attention normal.        Mood and Affect: Mood normal.        Thought Content: Thought content normal.    Diabetic Foot Exam - Simple   Simple Foot Form Diabetic Foot exam was performed with the following findings: Yes 07/23/2022 10:15 AM  Visual Inspection No deformities, no ulcerations, no other skin breakdown bilaterally: Yes Sensation Testing Intact to touch and monofilament testing bilaterally: Yes Pulse Check Posterior Tibialis and Dorsalis pulse intact bilaterally: Yes Comments      Wt Readings from Last 3 Encounters:  07/23/22 187 lb 9.6 oz (85.1 kg)  12/27/21 177 lb (80.3 kg)  12/27/21 177 lb (80.3 kg)    BP 100/70   Pulse (!) 107   Ht 5\' 10"  (1.778 m)   Wt 187 lb 9.6 oz (85.1 kg)   SpO2 98%   BMI 26.92 kg/m   Assessment and Plan:  Problem List Items Addressed This Visit       Digestive   Peptic ulcer disease (Chronic)    Symptoms well controlled on daily PPI No red flag signs such as weight loss, n/v, melena       Relevant Medications   omeprazole (PRILOSEC) 40 MG capsule   Other Relevant Orders   CBC with  Differential/Platelet     Endocrine   Hyperlipidemia associated with type 2 diabetes mellitus (Chronic)    Crestor stopped months ago due to low bp and lightheadedness LDL is  Lab Results  Component Value Date   LDLCALC 67 07/18/2021  with a goal of < 70. Will likely need to start medications - suggest atorvastatin 10 mg        Relevant Orders   Comprehensive metabolic panel   Lipid panel   Type II diabetes mellitus with complication - Primary (Chronic)    Clinically stable without s/s of hypoglycemia. Tolerating janumet well without side effects or other concerns. 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   Partially dictated using Dragon software, any errors are not intentional.  Reubin Milan, MD [facility] [place], [place]

## 2022-07-24 ENCOUNTER — Other Ambulatory Visit: Payer: Self-pay | Admitting: Internal Medicine

## 2022-07-24 DIAGNOSIS — E1169 Type 2 diabetes mellitus with other specified complication: Secondary | ICD-10-CM

## 2022-07-24 LAB — LIPID PANEL
Chol/HDL Ratio: 3.7 ratio (ref 0.0–5.0)
Cholesterol, Total: 198 mg/dL (ref 100–199)
HDL: 54 mg/dL (ref >39)
LDL Chol Calc (NIH): 126 mg/dL — ABNORMAL HIGH (ref 0–99)
Triglycerides: 103 mg/dL (ref 0–149)
VLDL Cholesterol Cal: 18 mg/dL (ref 5–40)

## 2022-07-24 LAB — CBC WITH DIFFERENTIAL/PLATELET
Basophils Absolute: 0.1 10*3/uL (ref 0.0–0.2)
Basos: 1 %
EOS (ABSOLUTE): 0.1 10*3/uL (ref 0.0–0.4)
Eos: 1 %
Hematocrit: 37 % — ABNORMAL LOW (ref 37.5–51.0)
Hemoglobin: 12.3 g/dL — ABNORMAL LOW (ref 13.0–17.7)
Immature Grans (Abs): 0 10*3/uL (ref 0.0–0.1)
Immature Granulocytes: 0 %
Lymphocytes Absolute: 1.8 10*3/uL (ref 0.7–3.1)
Lymphs: 24 %
MCH: 26.7 pg (ref 26.6–33.0)
MCHC: 33.2 g/dL (ref 31.5–35.7)
MCV: 80 fL (ref 79–97)
Monocytes Absolute: 0.6 10*3/uL (ref 0.1–0.9)
Monocytes: 7 %
Neutrophils Absolute: 5.2 10*3/uL (ref 1.4–7.0)
Neutrophils: 67 %
Platelets: 221 10*3/uL (ref 150–450)
RBC: 4.61 x10E6/uL (ref 4.14–5.80)
RDW: 14.5 % (ref 11.6–15.4)
WBC: 7.7 10*3/uL (ref 3.4–10.8)

## 2022-07-24 LAB — COMPREHENSIVE METABOLIC PANEL
ALT: 13 IU/L (ref 0–44)
AST: 22 IU/L (ref 0–40)
Albumin/Globulin Ratio: 1.3 (ref 1.2–2.2)
Albumin: 4.1 g/dL (ref 3.8–4.8)
Alkaline Phosphatase: 74 IU/L (ref 44–121)
BUN/Creatinine Ratio: 9 — ABNORMAL LOW (ref 10–24)
BUN: 14 mg/dL (ref 8–27)
Bilirubin Total: 0.5 mg/dL (ref 0.0–1.2)
CO2: 17 mmol/L — ABNORMAL LOW (ref 20–29)
Calcium: 9.6 mg/dL (ref 8.6–10.2)
Chloride: 102 mmol/L (ref 96–106)
Creatinine, Ser: 1.56 mg/dL — ABNORMAL HIGH (ref 0.76–1.27)
Globulin, Total: 3.2 g/dL (ref 1.5–4.5)
Glucose: 125 mg/dL — ABNORMAL HIGH (ref 70–99)
Potassium: 5.4 mmol/L — ABNORMAL HIGH (ref 3.5–5.2)
Sodium: 137 mmol/L (ref 134–144)
Total Protein: 7.3 g/dL (ref 6.0–8.5)
eGFR: 45 mL/min/{1.73_m2} — ABNORMAL LOW (ref >59)

## 2022-07-24 MED ORDER — ATORVASTATIN CALCIUM 10 MG PO TABS: 10.0000 mg | ORAL_TABLET | Freq: Every day | ORAL | 1 refills | Status: DC

## 2022-07-24 NOTE — Telephone Encounter (Signed)
Pt stated he has been having issues with the pharmacy giving him his glucose blood (ONETOUCH VERIO) test strip. Stated pharmacy advised him a pre-auth is needed for Medicare.  Pt stated he is completely out of his test strips.  Please advise.

## 2022-08-02 ENCOUNTER — Other Ambulatory Visit: Payer: Self-pay | Admitting: Internal Medicine

## 2022-08-02 ENCOUNTER — Other Ambulatory Visit: Payer: Self-pay

## 2022-08-02 DIAGNOSIS — E1169 Type 2 diabetes mellitus with other specified complication: Secondary | ICD-10-CM

## 2022-10-10 LAB — HEMOGLOBIN A1C: Hemoglobin A1C: 7.8

## 2022-11-21 ENCOUNTER — Encounter: Payer: Self-pay | Admitting: Internal Medicine

## 2022-11-21 ENCOUNTER — Ambulatory Visit (INDEPENDENT_AMBULATORY_CARE_PROVIDER_SITE_OTHER): Payer: Medicare Other | Admitting: Internal Medicine

## 2022-11-21 VITALS — BP 128/78 | HR 93 | Ht 70.0 in | Wt 185.0 lb

## 2022-11-21 DIAGNOSIS — Z7984 Long term (current) use of oral hypoglycemic drugs: Secondary | ICD-10-CM | POA: Diagnosis not present

## 2022-11-21 DIAGNOSIS — E1169 Type 2 diabetes mellitus with other specified complication: Secondary | ICD-10-CM

## 2022-11-21 DIAGNOSIS — E785 Hyperlipidemia, unspecified: Secondary | ICD-10-CM

## 2022-11-21 DIAGNOSIS — E118 Type 2 diabetes mellitus with unspecified complications: Secondary | ICD-10-CM | POA: Diagnosis not present

## 2022-11-21 DIAGNOSIS — Z23 Encounter for immunization: Secondary | ICD-10-CM | POA: Diagnosis not present

## 2022-11-21 NOTE — Assessment & Plan Note (Signed)
Blood sugars stable without hypoglycemic symptoms or events. Current regimen is Janumet. Changes made last visit are none. Lab Results  Component Value Date   HGBA1C 7.8 10/10/2022

## 2022-11-21 NOTE — Assessment & Plan Note (Addendum)
Cholesterol elevated last visit on no medications. He had stopped Crestor due to SE of lightheadedness. Recommend atorvastatin but he did not start it - resumed Crestor 40 mg instead - no SE noted so far Lab Results  Component Value Date   LDLCALC 126 (H) 07/23/2022

## 2022-11-21 NOTE — Progress Notes (Signed)
Date:  11/21/2022   Name:  Marc Bond   DOB:  [birth year]   MRN:  [id number]   Chief Complaint: Hyperlipidemia  Hyperlipidemia This is a chronic problem. The problem is uncontrolled. Recent lipid tests were reviewed and are high. Pertinent negatives include no chest pain or shortness of breath. Current antihyperlipidemic treatment includes statins (atorvastatin started last visit).  Diabetes He presents for his follow-up diabetic visit. He has type 2 diabetes mellitus. His disease course has been stable. Pertinent negatives for hypoglycemia include no dizziness or headaches. Pertinent negatives for diabetes include no chest pain, no fatigue and no weakness. Current diabetic treatments: janumet.    Lab Results  Component Value Date   NA 137 07/23/2022   K 5.4 (H) 07/23/2022   CO2 17 (L) 07/23/2022   GLUCOSE 125 (H) 07/23/2022   BUN 14 07/23/2022   CREATININE 1.56 (H) 07/23/2022   CALCIUM 9.6 07/23/2022   EGFR 45 (L) 07/23/2022   GFRNONAA 40 (L) 02/10/2020   Lab Results  Component Value Date   CHOL 198 07/23/2022   HDL 54 07/23/2022   LDLCALC 126 (H) 07/23/2022   TRIG 103 07/23/2022   CHOLHDL 3.7 07/23/2022   Lab Results  Component Value Date   TSH 1.930 07/18/2021   Lab Results  Component Value Date   HGBA1C 7.8 10/10/2022   Lab Results  Component Value Date   WBC 7.7 07/23/2022   HGB 12.3 (L) 07/23/2022   HCT 37.0 (L) 07/23/2022   MCV 80 07/23/2022   PLT 221 07/23/2022   Lab Results  Component Value Date   ALT 13 07/23/2022   AST 22 07/23/2022   ALKPHOS 74 07/23/2022   BILITOT 0.5 07/23/2022   Lab Results  Component Value Date   VD25OH 65.0 02/10/2020     Review of Systems  Constitutional:  Negative for fatigue and unexpected weight change.  HENT:  Negative for nosebleeds.   Eyes:  Negative for visual disturbance.  Respiratory:  Negative for cough, chest tightness, shortness of breath and wheezing.   Cardiovascular:  Negative for chest pain,  palpitations and leg swelling.  Gastrointestinal:  Negative for abdominal pain, constipation and diarrhea.  Neurological:  Negative for dizziness, weakness, light-headedness and headaches.    Patient Active Problem List   Diagnosis Date Noted   1st degree AV block 05/31/2020   Stage 3b chronic kidney disease (HCC) 10/13/2019   Ulnar impingement syndrome, left 10/27/2018   Renal cyst, right 10/29/2016   Pulmonary cavitary lesion 10/16/2016   PPD positive, treated 10/16/2016   Degenerative disc disease, lumbar 09/29/2015   Primary osteoarthritis of right knee 09/29/2015   Irritable colon 03/12/2015   Type II diabetes mellitus with complication (HCC) 03/12/2015   Hyperlipidemia associated with type 2 diabetes mellitus (HCC) 03/12/2015   BPH with obstruction/lower urinary tract symptoms 03/12/2015   Failure of erection 03/12/2015   Seborrhea capitis 03/12/2015   Peptic ulcer disease 03/12/2015   Acquired deformities of toe 03/03/2012   Bilateral cataracts 12/10/2011    Allergies  Allergen Reactions   Ace Inhibitors Other (See Comments)    Sx hypotension on low dose    Past Surgical History:  Procedure Laterality Date   COLONOSCOPY  11/2011   normal - 10 yr follow up Dr. Earlean Polka   PROSTATE SURGERY      Social History   Tobacco Use   Smoking status: Former    Current packs/day: 0.00    Average packs/day: 0.5 packs/day for 13.0 years (6.5  ttl pk-yrs)    Types: Cigarettes, Pipe, Cigars    Start date: 04/16/1965    Quit date: 04/16/1978    Years since quitting: 44.6   Smokeless tobacco: Never   Tobacco comments:    I quit smoking about 40 years ago.  Vaping Use   Vaping status: Never Used  Substance Use Topics   Alcohol use: No   Drug use: Not Currently     Medication list has been reviewed and updated.  Current Meds  Medication Sig   aspirin 81 MG chewable tablet Chew by mouth daily.   cholecalciferol (VITAMIN D) 1000 units tablet Take 1,000 Units by mouth daily.    diclofenac (VOLTAREN) 50 MG EC tablet Take 50 mg by mouth 2 (two) times daily.   dicyclomine (BENTYL) 10 MG capsule TAKE 1 CAPSULE(10 MG) BY MOUTH FOUR TIMES DAILY BEFORE MEALS AND AT BEDTIME   glucose blood (ONETOUCH VERIO) test strip USE 1 TEST STRIP TWICE DAILY   glucose blood test strip USE TO TEST BLOOD SUGAR TWICE DAILY   Lancets (ONETOUCH DELICA PLUS LANCET33G) MISC Apply topically 2 (two) times daily.   omeprazole (PRILOSEC) 40 MG capsule TAKE 1 CAPSULE BY MOUTH EVERY DAY   rosuvastatin (CRESTOR) 40 MG tablet Take 40 mg by mouth daily.   sitaGLIPtin-metformin (JANUMET) 50-1000 MG tablet Take 1 tablet by mouth 2 (two) times daily with a meal.   SPIRIVA RESPIMAT 2.5 MCG/ACT AERS SMARTSIG:2 Puff(s) Via Inhaler Daily   [DISCONTINUED] finasteride (PROSCAR) 5 MG tablet Take 5 mg by mouth daily.       11/21/2022    8:17 AM 07/23/2022    9:57 AM 12/27/2021    9:03 AM 07/18/2021    9:28 AM  GAD 7 : Generalized Anxiety Score  Nervous, Anxious, on Edge 0 0 0 0  Control/stop worrying 0 0 0 0  Worry too much - different things 0 0 0 0  Trouble relaxing 0 0 0 0  Restless 0 0 0 0  Easily annoyed or irritable 0 0 0 0  Afraid - awful might happen 0 0 0 0  Total GAD 7 Score 0 0 0 0  Anxiety Difficulty Not difficult at all Not difficult at all Not difficult at all Not difficult at all       11/21/2022    8:17 AM 07/23/2022    9:56 AM 12/27/2021    9:28 AM  Depression screen PHQ 2/9  Decreased Interest 0 0   Down, Depressed, Hopeless 0 0 0  PHQ - 2 Score 0 0 0  Altered sleeping 0 0 0  Tired, decreased energy 0 0 0  Change in appetite 0 0 0  Feeling bad or failure about yourself  0 0 0  Trouble concentrating 0 0 0  Moving slowly or fidgety/restless 0 0 0  Suicidal thoughts 0 0 0  PHQ-9 Score 0 0 0  Difficult doing work/chores Not difficult at all Not difficult at all Not difficult at all    BP Readings from Last 3 Encounters:  11/21/22 128/78  07/23/22 100/70  12/27/21 106/78     Physical Exam Vitals and nursing note reviewed.  Constitutional:      General: He is not in acute distress.    Appearance: He is well-developed.  HENT:     Head: Normocephalic and atraumatic.  Neck:     Vascular: No carotid bruit.  Cardiovascular:     Rate and Rhythm: Normal rate and regular rhythm.  Pulmonary:  Effort: Pulmonary effort is normal. No respiratory distress.     Breath sounds: No wheezing or rhonchi.  Musculoskeletal:     Cervical back: Normal range of motion.     Right lower leg: No edema.     Left lower leg: No edema.  Skin:    General: Skin is warm and dry.     Findings: No rash.  Neurological:     Mental Status: He is alert and oriented to person, place, and time.  Psychiatric:        Mood and Affect: Mood normal.        Behavior: Behavior normal.     Wt Readings from Last 3 Encounters:  11/21/22 185 lb (83.9 kg)  07/23/22 187 lb 9.6 oz (85.1 kg)  12/27/21 177 lb (80.3 kg)    BP 128/78   Pulse 93   Ht 5\' 10"  (1.778 m)   Wt 185 lb (83.9 kg)   SpO2 97%   BMI 26.54 kg/m   Assessment and Plan:  Problem List Items Addressed This Visit       Unprioritized   Type II diabetes mellitus with complication (HCC) (Chronic)    Blood sugars stable without hypoglycemic symptoms or events. Current regimen is Janumet. Changes made last visit are none. Lab Results  Component Value Date   HGBA1C 7.8 10/10/2022         Relevant Medications   rosuvastatin (CRESTOR) 40 MG tablet   Hyperlipidemia associated with type 2 diabetes mellitus (HCC) - Primary (Chronic)    Cholesterol elevated last visit on no medications. He had stopped Crestor due to SE of lightheadedness. Recommend atorvastatin but he did not start it - resumed Crestor 40 mg instead - no SE noted so far Lab Results  Component Value Date   LDLCALC 126 (H) 07/23/2022         Relevant Medications   rosuvastatin (CRESTOR) 40 MG tablet   Other Relevant Orders   Comprehensive  metabolic panel   Lipid panel   Other Visit Diagnoses     Long term current use of oral hypoglycemic drug       Need for vaccination for pneumococcus       Relevant Orders   Pneumococcal conjugate vaccine 20-valent       No follow-ups on file.    Reubin Milan, MD [facility] [place]

## 2022-12-18 LAB — HEMOGLOBIN A1C: Hemoglobin A1C: 7.3

## 2023-01-08 ENCOUNTER — Ambulatory Visit: Payer: Medicare Other

## 2023-01-08 VITALS — BP 120/70 | Ht 70.0 in | Wt 183.8 lb

## 2023-01-08 DIAGNOSIS — Z23 Encounter for immunization: Secondary | ICD-10-CM

## 2023-01-08 DIAGNOSIS — Z0000 Encounter for general adult medical examination without abnormal findings: Secondary | ICD-10-CM

## 2023-01-08 NOTE — Progress Notes (Signed)
Subjective:   Marc Bond is a 77 y.o. male who presents for Medicare Annual/Subsequent preventive examination.  Visit Complete: In person  Cardiac Risk Factors include: advanced age (>67men, >5 women);male gender;diabetes mellitus;dyslipidemia     Objective:    Today's Vitals   01/08/23 0945  BP: 120/70  Weight: 183 lb 12.8 oz (83.4 kg)  Height: 5\' 10"  (1.778 m)   Body mass index is 26.37 kg/m.     01/08/2023    9:53 AM 12/27/2021    9:34 AM 12/26/2020   10:57 AM 11/09/2019   10:10 AM 10/20/2018    9:30 AM 10/16/2017   10:25 AM 09/29/2015    9:29 AM  Advanced Directives  Does Patient Have a Medical Advance Directive? No No No No Yes Yes Yes  Type of Agricultural consultant;Living will Healthcare Power of [place];Living will Healthcare Power of [place];Living will  Does patient want to make changes to medical advance directive?       No - Patient declined  Copy of Healthcare Power of Attorney in Chart?     No - copy requested No - copy requested   Would patient like information on creating a medical advance directive? No - Patient declined Yes (Inpatient - patient defers creating a medical advance directive at this time - Information given) Yes (MAU/Ambulatory/Procedural Areas - Information given) Yes (MAU/Ambulatory/Procedural Areas - Information given)       Current Medications (verified) Outpatient Encounter Medications as of 01/08/2023  Medication Sig   aspirin 81 MG chewable tablet Chew by mouth daily.   cholecalciferol (VITAMIN D) 1000 units tablet Take 1,000 Units by mouth daily.   diclofenac (VOLTAREN) 50 MG EC tablet Take 50 mg by mouth 2 (two) times daily.   dicyclomine (BENTYL) 10 MG capsule TAKE 1 CAPSULE(10 MG) BY MOUTH FOUR TIMES DAILY BEFORE MEALS AND AT BEDTIME   glucose blood (ONETOUCH VERIO) test strip USE 1 TEST STRIP TWICE DAILY   glucose blood test strip USE TO TEST BLOOD SUGAR TWICE DAILY   Lancets (ONETOUCH DELICA PLUS  LANCET33G) MISC Apply topically 2 (two) times daily.   omeprazole (PRILOSEC) 40 MG capsule TAKE 1 CAPSULE BY MOUTH EVERY DAY   rosuvastatin (CRESTOR) 40 MG tablet Take 40 mg by mouth daily.   sitaGLIPtin-metformin (JANUMET) 50-1000 MG tablet Take 1 tablet by mouth 2 (two) times daily with a meal.   SPIRIVA RESPIMAT 2.5 MCG/ACT AERS SMARTSIG:2 Puff(s) Via Inhaler Daily   No facility-administered encounter medications on file as of 01/08/2023.    Allergies (verified) Ace inhibitors   History: Past Medical History:  Diagnosis Date   Bilateral cataracts    CKD (chronic kidney disease) stage 3, GFR 30-59 ml/min (HCC)    Degenerative disc disease, lumbar    Diabetes mellitus without complication (HCC)    Enlarged prostate    GERD (gastroesophageal reflux disease)    Hyperlipidemia    Hypertension    IBS (irritable bowel syndrome)    Mood disorder (HCC) 08/20/2016   Peptic ulcer disease    Primary osteoarthritis of right knee    Renal cyst, right    Past Surgical History:  Procedure Laterality Date   COLONOSCOPY  11/2011   normal - 10 yr follow up Dr. Earlean Polka   PROSTATE SURGERY     Family History  Problem Relation Age of Onset   Hypertension Sister    Kidney disease Mother    Hypertension Mother    Emphysema Father  Diabetes Brother    Diabetes Sister    Hypertension Sister    Diabetes Brother    Social History   Socioeconomic History   Marital status: Widowed    Spouse name: Not on file   Number of children: 2   Years of education: Not on file   Highest education level: 11th grade  Occupational History   Occupation: Retired  Tobacco Use   Smoking status: Former    Current packs/day: 0.00    Average packs/day: 0.5 packs/day for 13.0 years (6.5 ttl pk-yrs)    Types: Cigarettes, Pipe, Cigars    Start date: 04/16/1965    Quit date: 04/16/1978    Years since quitting: 44.7   Smokeless tobacco: Never   Tobacco comments:    I quit smoking about 40 years ago.  Vaping Use    Vaping status: Never Used  Substance and Sexual Activity   Alcohol use: No   Drug use: Not Currently   Sexual activity: Yes  Other Topics Concern   Not on file  Social History Narrative   Pt's sister lives with him and 2 grandsons.    Social Determinants of Health   Financial Resource Strain: Low Risk  (01/08/2023)   Overall Financial Resource Strain (CARDIA)    Difficulty of Paying Living Expenses: Not hard at all  Food Insecurity: No Food Insecurity (01/08/2023)   Hunger Vital Sign    Worried About Running Out of Food in the Last Year: Never true    Ran Out of Food in the Last Year: Never true  Transportation Needs: No Transportation Needs (01/08/2023)   PRAPARE - Administrator, Civil Service (Medical): No    Lack of Transportation (Non-Medical): No  Physical Activity: Sufficiently Active (01/08/2023)   Exercise Vital Sign    Days of Exercise per Week: 7 days    Minutes of Exercise per Session: 30 min  Stress: No Stress Concern Present (01/08/2023)   [employer] of Occupational Health - Occupational Stress Questionnaire    Feeling of Stress : Not at all  Social Connections: Moderately Isolated (01/08/2023)   Social Connection and Isolation Panel [NHANES]    Frequency of Communication with Friends and Family: More than three times a week    Frequency of Social Gatherings with Friends and Family: More than three times a week    Attends Religious Services: More than 4 times per year    Active Member of [employer] or Organizations: No    Attends Banker Meetings: Never    Marital Status: Widowed    Tobacco Counseling Counseling given: Not Answered Tobacco comments: I quit smoking about 40 years ago.   Clinical Intake:  Pre-visit preparation completed: Yes  Pain : No/denies pain     Nutritional Status: BMI 25 -29 Overweight Nutritional Risks: None Diabetes: Yes CBG done?: No Did pt. bring in CBG monitor from home?: No  How often do you  need to have someone help you when you read instructions, pamphlets, or other written materials from your doctor or pharmacy?: 1 - Never  Interpreter Needed?: No  Information entered by :: Kennedy Bucker, LPN   Activities of Daily Living    01/08/2023    9:55 AM  In your present state of health, do you have any difficulty performing the following activities:  Hearing? 0  Vision? 0  Difficulty concentrating or making decisions? 0  Walking or climbing stairs? 1  Comment knee  Dressing or bathing? 0  Doing errands,  shopping? 0  Preparing Food and eating ? N  Using the Toilet? N  In the past six months, have you accidently leaked urine? N  Do you have problems with loss of bowel control? N  Managing your Medications? N  Managing your Finances? N  Housekeeping or managing your Housekeeping? N    Patient Care Team: Reubin Milan, MD as PCP - General (Internal Medicine) Victorino December, MD as Referring Physician (Pulmonary Disease) Jeronimo Norma (Inactive) as Physician Assistant (Endocrinology) Jacquiline Doe, MD as Referring Physician (Urology) Arrie Senate, MD as Referring Physician (Cardiology)  Indicate any recent Medical Services you may have received from other than [facility] providers in the past year (date may be approximate).     Assessment:   This is a routine wellness examination for Marc Bond.  Hearing/Vision screen Hearing Screening - Comments:: No aids Vision Screening - Comments:: Wears glasses- Dr.Newman, also sees specialist at [facility] center   Goals Addressed             This Visit's Progress    DIET - EAT MORE FRUITS AND VEGETABLES         Depression Screen    01/08/2023    9:51 AM 11/21/2022    8:17 AM 07/23/2022    9:56 AM 12/27/2021    9:28 AM 12/27/2021    9:03 AM 07/18/2021    9:28 AM 12/26/2020   10:56 AM  PHQ 2/9 Scores  PHQ - 2 Score 0 0 0 0 0 0 0  PHQ- 9 Score 0 0 0 0 0 0 0    Fall Risk    01/08/2023    9:54 AM 11/21/2022     8:17 AM 07/23/2022    9:57 AM 12/27/2021    9:35 AM 12/27/2021    9:03 AM  Fall Risk   Falls in the past year? 1 1 0 0 0  Number falls in past yr: 0 1 0 0 0  Injury with Fall? 0 0 0 0 0  Risk for fall due to : History of fall(s) History of fall(s) No Fall Risks No Fall Risks No Fall Risks  Follow up Falls evaluation completed;Falls prevention discussed Falls evaluation completed Falls evaluation completed Falls evaluation completed Falls evaluation completed    MEDICARE RISK AT HOME: Medicare Risk at Home Any stairs in or around the home?: No If so, are there any without handrails?: No Home free of loose throw rugs in walkways, pet beds, electrical cords, etc?: Yes Adequate lighting in your home to reduce risk of falls?: Yes Life alert?: No Use of a cane, walker or w/c?: No Grab bars in the bathroom?: Yes Shower chair or bench in shower?: No Elevated toilet seat or a handicapped toilet?: No  TIMED UP AND GO:  Was the test performed?  Yes  Length of time to ambulate 10 feet: 4 sec Gait steady and fast without use of assistive device    Cognitive Function:        01/08/2023    9:56 AM 12/27/2021    9:36 AM 11/09/2019   10:13 AM 10/20/2018    9:36 AM 10/16/2017   10:29 AM  6CIT Screen  What Year? 0 points 0 points 0 points 0 points 0 points  What month? 0 points 0 points 0 points 0 points 0 points  What time? 0 points 0 points 0 points 0 points 0 points  Count back from 20 0 points 0 points 0 points 0 points 0 points  Months in reverse 4 points 0 points 0 points 0 points 0 points  Repeat phrase 0 points 6 points 0 points 4 points 10 points  Total Score 4 points 6 points 0 points 4 points 10 points    Immunizations Immunization History  Administered Date(s) Administered   Fluad Quad(high Dose 65+) 12/30/2018, 01/07/2020, 12/26/2020, 12/27/2021   Influenza, High Dose Seasonal PF 12/19/2017   Influenza,inj,Quad PF,6+ Mos 12/26/2016   Influenza-Unspecified 02/16/2015, 12/26/2016    Moderna Sars-Covid-2 Vaccination 05/09/2019, 06/09/2019, 02/09/2020   PNEUMOCOCCAL CONJUGATE-20 11/21/2022   Pneumococcal Conjugate-13 09/29/2015, 03/27/2017   Pneumococcal Polysaccharide-23 04/17/2008, 12/04/2012   Tdap 10/24/2017   Zoster Recombinant(Shingrix) 01/22/2018, 03/25/2018    TDAP status: Up to date  Flu Vaccine status: Completed at today's visit  Pneumococcal vaccine status: Up to date  Covid-19 vaccine status: Completed vaccines  Qualifies for Shingles Vaccine? Yes   Zostavax completed No   Shingrix Completed?: Yes  Screening Tests Health Maintenance  Topic Date Due   INFLUENZA VACCINE  11/15/2022   COVID-19 Vaccine (4 - 2023-24 season) 12/16/2022   HEMOGLOBIN A1C  04/11/2023   Diabetic kidney evaluation - Urine ACR  05/11/2023   OPHTHALMOLOGY EXAM  06/26/2023   FOOT EXAM  07/23/2023   Diabetic kidney evaluation - eGFR measurement  11/21/2023   Medicare Annual Wellness (AWV)  01/08/2024   DTaP/Tdap/Td (2 - Td or Tdap) 10/25/2027   Pneumonia Vaccine 71+ Years old  Completed   Zoster Vaccines- Shingrix  Completed   Hepatitis C Screening  Addressed   HPV VACCINES  Aged Out   Colonoscopy  Discontinued    Health Maintenance  Health Maintenance Due  Topic Date Due   INFLUENZA VACCINE  11/15/2022   COVID-19 Vaccine (4 - 2023-24 season) 12/16/2022    Colorectal cancer screening: No longer required.   Lung Cancer Screening: (Low Dose CT Chest recommended if Age 61-80 years, 20 pack-year currently smoking OR have quit w/in 15years.) does not qualify.    Additional Screening:  Hepatitis C Screening: does qualify; Completed no  Vision Screening: Recommended annual ophthalmology exams for early detection of glaucoma and other disorders of the eye. Is the patient up to date with their annual eye exam?  Yes  Who is the provider or what is the name of the office in which the patient attends annual eye exams? Dr.Novey If pt is not established with a  provider, would they like to be referred to a provider to establish care? No .   Dental Screening: Recommended annual dental exams for proper oral hygiene  Diabetic Foot Exam: Diabetic Foot Exam: Completed 07/23/22  Community Resource Referral / Chronic Care Management: CRR required this visit?  No   CCM required this visit?  No     Plan:     I have personally reviewed and noted the following in the patient's chart:   Medical and social history Use of alcohol, tobacco or illicit drugs  Current medications and supplements including opioid prescriptions. Patient is not currently taking opioid prescriptions. Functional ability and status Nutritional status Physical activity Advanced directives List of other physicians Hospitalizations, surgeries, and ER visits in previous 12 months Vitals Screenings to include cognitive, depression, and falls Referrals and appointments  In addition, I have reviewed and discussed with patient certain preventive protocols, quality metrics, and best practice recommendations. A written personalized care plan for preventive services as well as general preventive health recommendations were provided to patient.     Hal Hope, LPN   [id number]  After Visit Summary: my chart  Nurse Notes: none- flu shot given

## 2023-02-25 IMAGING — CR DG HIP (WITH OR WITHOUT PELVIS) 2-3V*L*
3 series · 3 of 3 positions shown · non-contrast
Comparison: None.

CLINICAL DATA: Worsening left hip pain for 4 months.  No injury.

EXAM:
DG HIP (WITH OR WITHOUT PELVIS) 2-3V LEFT

[pelvis ap]
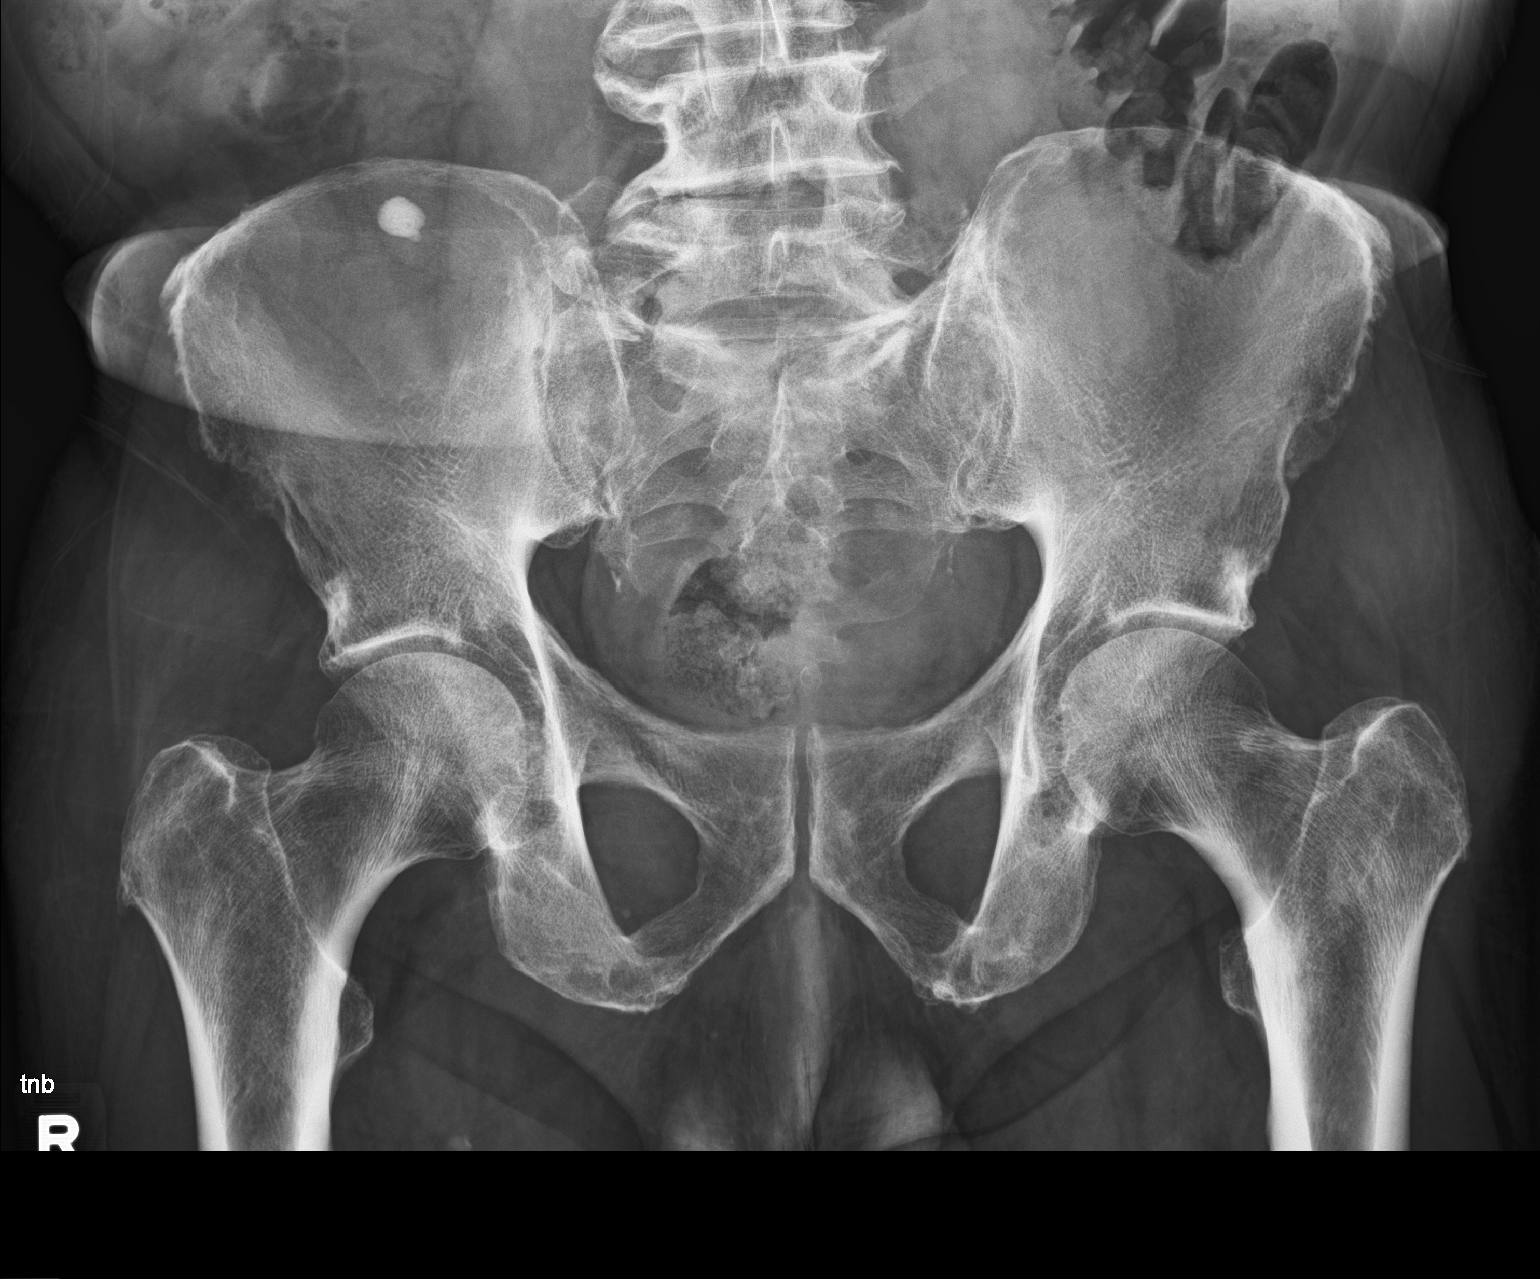

[hip ap]
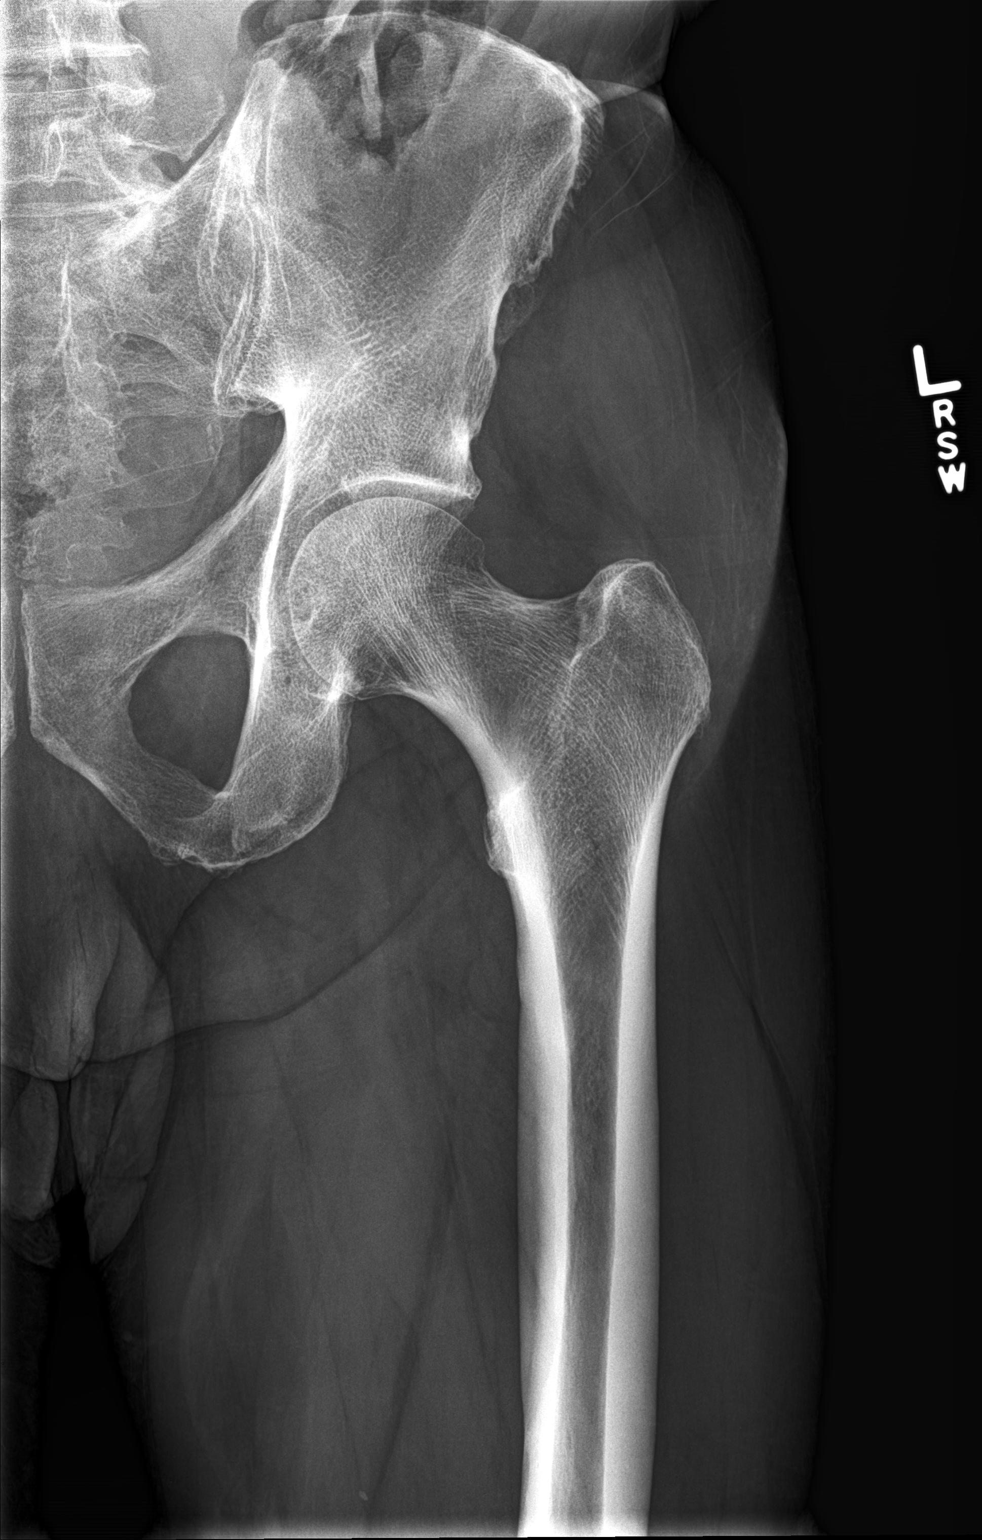

[hip lat]
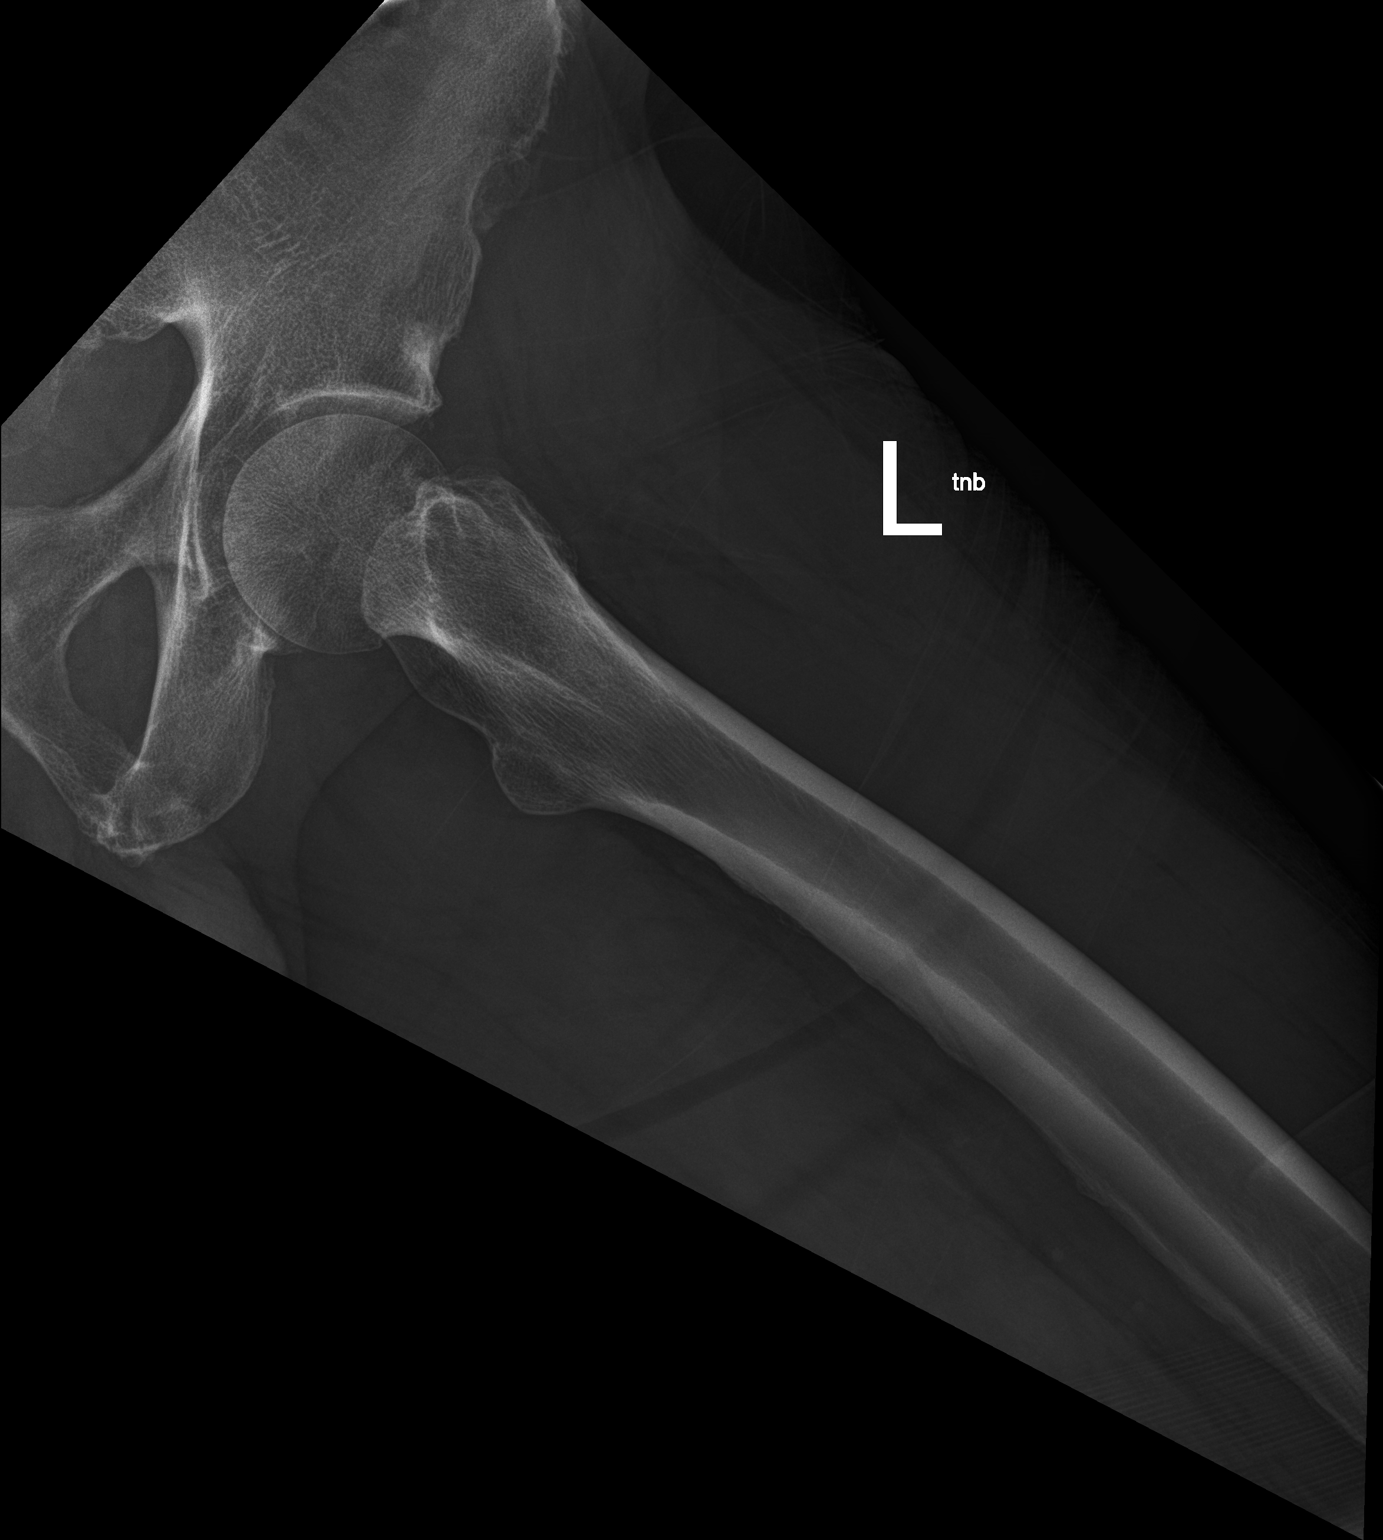

[3 of 3 positions shown; findings below may reference images not displayed]

FINDINGS: Degenerative changes in the lower lumbar spine and both hips. Pelvis
and left hip demonstrate no evidence of acute fracture or
dislocation. No focal bone lesion or bone destruction. Bone cortex
appears intact. Soft tissues are unremarkable.
IMPRESSION: Mild degenerative changes in the left hip. No acute displaced
fractures identified.

## 2023-04-03 LAB — HEMOGLOBIN A1C: Hemoglobin A1C: 7.6

## 2023-04-25 ENCOUNTER — Other Ambulatory Visit: Payer: Self-pay | Admitting: Internal Medicine

## 2023-04-25 DIAGNOSIS — K5901 Slow transit constipation: Secondary | ICD-10-CM

## 2023-04-25 NOTE — Telephone Encounter (Signed)
 Medication Refill -  Most Recent Primary Care Visit:  Provider: GWENN JHONNIE RAMAN  Department: [facility] [place]  Visit Type: MEDICARE AWV, SEQUENTIAL  Date: 01/08/2023  Medication: dicyclomine  (BENTYL ) 10 MG capsule    Has the patient contacted their pharmacy? No   Is this the correct pharmacy for this prescription? Yes If no, delete pharmacy and type the correct one.  This is the patient's preferred pharmacy:   [id number] [place] [place] - [address] Phone: [phone]  Fax: [phone]      Has the prescription been filled recently? No  Is the patient out of the medication? No but he will be tomorrow  Has the patient been seen for an appointment in the last year OR does the patient have an upcoming appointment? Yes  Can we respond through MyChart? Yes  Please assist patient further

## 2023-04-29 MED ORDER — DICYCLOMINE HCL 10 MG PO CAPS: 10.0000 mg | ORAL_CAPSULE | Freq: Four times a day (QID) | ORAL | 0 refills | Status: DC

## 2023-04-29 NOTE — Telephone Encounter (Signed)
 Duplicate request, Rx reordered today already. Requested Prescriptions  Pending Prescriptions Disp Refills   dicyclomine  (BENTYL ) 10 MG capsule [Pharmacy Med Name: DICYCLOMINE  10MG  CAPSULES] 362 capsule 0    Sig: TAKE 1 CAPSULE(10 MG) BY MOUTH FOUR TIMES DAILY BEFORE MEALS AND AT BEDTIME     Gastroenterology:  Antispasmodic Agents Passed - 04/29/2023 12:30 PM      Passed - Valid encounter within last 12 months    Recent Outpatient Visits           5 months ago Hyperlipidemia associated with type 2 diabetes mellitus (HCC)   [place] [facility] at [facility], Leita DEL, MD   9 months ago Type II diabetes mellitus with complication [facility])   [place] [facility] at [facility], Leita DEL, MD   1 year ago Neck muscle spasm   [place] [facility] at [facility], Leita DEL, MD   1 year ago Hyperlipidemia associated with type 2 diabetes mellitus [facility])   [place] [facility] at [facility], Leita DEL, MD   2 years ago Acute otitis externa of right ear, unspecified type   [facility] at [facility], Leita DEL, MD       Future Appointments             In 3 months Justus, Leita DEL, MD [facility] at [facility], [facility]

## 2023-04-29 NOTE — Telephone Encounter (Signed)
 Requested Prescriptions  Pending Prescriptions Disp Refills   dicyclomine  (BENTYL ) 10 MG capsule 360 capsule 0    Sig: Take 1 capsule (10 mg total) by mouth QID.     Gastroenterology:  Antispasmodic Agents Passed - 04/29/2023 11:37 AM      Passed - Valid encounter within last 12 months    Recent Outpatient Visits           5 months ago Hyperlipidemia associated with type 2 diabetes mellitus (HCC)   [place] [facility] at [facility], Leita DEL, MD   9 months ago Type II diabetes mellitus with complication [facility])   [place] [facility] at [facility], Leita DEL, MD   1 year ago Neck muscle spasm   [place] [facility] at [facility], Leita DEL, MD   1 year ago Hyperlipidemia associated with type 2 diabetes mellitus [facility])   [place] [facility] at [facility], Leita DEL, MD   2 years ago Acute otitis externa of right ear, unspecified type   [facility] at [facility], Leita DEL, MD       Future Appointments             In 3 months Justus, Leita DEL, MD [facility] at [employer] [facility], [facility]

## 2023-05-29 ENCOUNTER — Other Ambulatory Visit: Payer: Self-pay | Admitting: Internal Medicine

## 2023-05-29 DIAGNOSIS — K279 Peptic ulcer, site unspecified, unspecified as acute or chronic, without hemorrhage or perforation: Secondary | ICD-10-CM

## 2023-05-29 NOTE — Telephone Encounter (Signed)
Requested Prescriptions  Pending Prescriptions Disp Refills   omeprazole (PRILOSEC) 40 MG capsule [Pharmacy Med Name: OMEPRAZOLE 40MG  CAPSULES] 90 capsule 1    Sig: TAKE 1 CAPSULE BY MOUTH EVERY DAY     Gastroenterology: Proton Pump Inhibitors Passed - 05/29/2023  3:42 PM      Passed - Valid encounter within last 12 months    Recent Outpatient Visits           6 months ago Hyperlipidemia associated with type 2 diabetes mellitus (HCC)   [place] [facility] at [facility], Nyoka Cowden, MD   10 months ago Type II diabetes mellitus with complication [facility])   [place] [facility] at [facility], Nyoka Cowden, MD   1 year ago Neck muscle spasm   [place] [facility] at [facility], Nyoka Cowden, MD   1 year ago Hyperlipidemia associated with type 2 diabetes mellitus [facility])   [place] [facility] at [facility], Nyoka Cowden, MD   2 years ago Acute otitis externa of right ear, unspecified type   [facility] at [facility], Nyoka Cowden, MD       Future Appointments             In 2 months Judithann Graves, Nyoka Cowden, MD [facility] at [facility]

## 2023-07-08 ENCOUNTER — Ambulatory Visit: Payer: Self-pay

## 2023-07-08 NOTE — Telephone Encounter (Signed)
  Chief Complaint: Area of swelling to neck Symptoms: pain and swelling to neck Frequency: unknown Pertinent Negatives: Patient denies CP, fever Disposition: [] ED /[] Urgent Care (no appt availability in office) / [x] Appointment(In office/virtual)/ []  [place] [facility]/ [] Home Care/ [] Refused Recommended Disposition /[] [place] [facility]/ []  Follow-up with PCP Additional Notes: Patient's girlfriend called for patient while he was at a dentist office. Girlfriend reports that patient states he thinks he has a possible cyst to one side of his neck. Patient reports some swelling and pain to an area of neck. Girlfriend was unable to answer most of triage questions as patient had asked to her make him an appointment to be seen. Per protocol, patient is recommended to be seen in the next 24 hours. Appointment made for tomorrow with PCP for 10:20 AM. Patient's girlfriend verbalized understanding and all questions answered.    Copied from CRM [id number]. Topic: Clinical - Red Word Triage >> Jul 08, 2023  3:19 PM Payton Doughty wrote: Red Word that prompted transfer to Nurse Triage: pt feels like he has cyst in his neck and it is moving Reason for Disposition  [1] Swelling is painful to touch AND [2] no fever  Answer Assessment - Initial Assessment Questions 1. APPEARANCE of SWELLING: "What does it look like?"     Area of his neck- 2. SIZE: "How large is the swelling?" (e.g., inches, cm; or compare to size of pinhead, tip of pen, eraser, coin, pea, grape, ping pong ball)      unsure 3. LOCATION: "Where is the swelling located?"     On neck-girlfriend is unable to give location information other than neck 4. ONSET: "When did the swelling start?    unsure 5. COLOR: "What color is it?" "Is there more than one color?"      6. PAIN: "Is there any pain?" If Yes, ask: "How bad is the pain?" (e.g., scale 1-10; or mild, moderate, severe)     - NONE (0): no pain   - MILD (1-3): doesn't interfere with normal  activities    - MODERATE (4-7): interferes with normal activities or awakens from sleep    - SEVERE (8-10): excruciating pain, unable to do any normal activities     Moderate 7. ITCH: "Does it itch?" If Yes, ask: "How bad is the itch?"      unsure 8. CAUSE: "What do you think caused the swelling?" Thinks it might be cyst 9 OTHER SYMPTOMS: "Do you have any other symptoms?" (e.g., fever)     *No Answer*  Protocols used: Skin Lump or Localized Swelling-A-AH

## 2023-07-08 NOTE — Telephone Encounter (Signed)
 Noted. Patient has an appointment scheduled for 07/09/2023.

## 2023-07-09 ENCOUNTER — Encounter: Payer: Self-pay | Admitting: Internal Medicine

## 2023-07-09 ENCOUNTER — Ambulatory Visit (INDEPENDENT_AMBULATORY_CARE_PROVIDER_SITE_OTHER): Admitting: Internal Medicine

## 2023-07-09 VITALS — BP 114/74 | HR 87 | Ht 70.0 in | Wt 179.5 lb

## 2023-07-09 DIAGNOSIS — N1832 Chronic kidney disease, stage 3b: Secondary | ICD-10-CM

## 2023-07-09 DIAGNOSIS — E118 Type 2 diabetes mellitus with unspecified complications: Secondary | ICD-10-CM

## 2023-07-09 DIAGNOSIS — K047 Periapical abscess without sinus: Secondary | ICD-10-CM

## 2023-07-09 DIAGNOSIS — E785 Hyperlipidemia, unspecified: Secondary | ICD-10-CM

## 2023-07-09 DIAGNOSIS — R590 Localized enlarged lymph nodes: Secondary | ICD-10-CM | POA: Diagnosis not present

## 2023-07-09 DIAGNOSIS — E1169 Type 2 diabetes mellitus with other specified complication: Secondary | ICD-10-CM | POA: Diagnosis not present

## 2023-07-09 DIAGNOSIS — Z7984 Long term (current) use of oral hypoglycemic drugs: Secondary | ICD-10-CM

## 2023-07-09 MED ORDER — AMOXICILLIN-POT CLAVULANATE 875-125 MG PO TABS: 1.0000 | ORAL_TABLET | Freq: Two times a day (BID) | ORAL | 0 refills | Status: AC

## 2023-07-09 NOTE — Progress Notes (Signed)
 Date:  07/09/2023   Name:  Marc Bond   DOB:  1945-10-10   MRN:  [id number]   Chief Complaint: Mass (Patient said he has a lump on front right side of the neck, pain 6/10,noticed it last Saturday)  Neck Pain  This is a new problem. The current episode started in the past 7 days. The problem occurs constantly. The problem has been unchanged. Associated with: swelling under the right jaw. The pain is present in the right side. The quality of the pain is described as aching. The pain is at a severity of 1/10. The pain is mild. Pertinent negatives include no chest pain, fever or trouble swallowing. Associated symptoms comments: Has extensive dental issues and may have infection.    Review of Systems  Constitutional:  Negative for chills, fatigue and fever.  HENT:  Positive for dental problem and facial swelling. Negative for ear pain and trouble swallowing.   Eyes:  Negative for visual disturbance.  Respiratory:  Negative for chest tightness and shortness of breath.   Cardiovascular:  Negative for chest pain and palpitations.  Musculoskeletal:  Positive for neck pain.     Lab Results  Component Value Date   NA 137 11/21/2022   K 5.3 (H) 11/21/2022   CO2 20 11/21/2022   GLUCOSE 127 (H) 11/21/2022   BUN 20 11/21/2022   CREATININE 1.49 (H) 11/21/2022   CALCIUM 10.0 11/21/2022   EGFR 48 (L) 11/21/2022   GFRNONAA 40 (L) 02/10/2020   Lab Results  Component Value Date   CHOL 122 11/21/2022   HDL 51 11/21/2022   LDLCALC 58 11/21/2022   TRIG 61 11/21/2022   CHOLHDL 2.4 11/21/2022   Lab Results  Component Value Date   TSH 1.930 07/18/2021   Lab Results  Component Value Date   HGBA1C 7.6 04/03/2023   Lab Results  Component Value Date   WBC 7.7 07/23/2022   HGB 12.3 (L) 07/23/2022   HCT 37.0 (L) 07/23/2022   MCV 80 07/23/2022   PLT 221 07/23/2022   Lab Results  Component Value Date   ALT 10 11/21/2022   AST 15 11/21/2022   ALKPHOS 74 11/21/2022   BILITOT 0.4  11/21/2022   Lab Results  Component Value Date   VD25OH 65.0 02/10/2020     Patient Active Problem List   Diagnosis Date Noted   1st degree AV block 05/31/2020   Stage 3b chronic kidney disease (HCC) 10/13/2019   Ulnar impingement syndrome, left 10/27/2018   Renal cyst, right 10/29/2016   Pulmonary cavitary lesion 10/16/2016   PPD positive, treated 10/16/2016   Degenerative disc disease, lumbar 09/29/2015   Primary osteoarthritis of right knee 09/29/2015   Irritable colon 03/12/2015   Type II diabetes mellitus with complication (HCC) 03/12/2015   Hyperlipidemia associated with type 2 diabetes mellitus (HCC) 03/12/2015   BPH with obstruction/lower urinary tract symptoms 03/12/2015   Failure of erection 03/12/2015   Seborrhea capitis 03/12/2015   Peptic ulcer disease 03/12/2015   Acquired deformities of toe 03/03/2012   Bilateral cataracts 12/10/2011    Allergies  Allergen Reactions   Ace Inhibitors Other (See Comments)    Sx hypotension on low dose    Past Surgical History:  Procedure Laterality Date   COLONOSCOPY  11/2011   normal - 10 yr follow up Dr. Earlean Polka   PROSTATE SURGERY      Social History   Tobacco Use   Smoking status: Former    Current packs/day: 0.00  Average packs/day: 0.5 packs/day for 13.0 years (6.5 ttl pk-yrs)    Types: Cigarettes, Pipe, Cigars    Start date: 04/16/1965    Quit date: 04/16/1978    Years since quitting: 45.2   Smokeless tobacco: Never   Tobacco comments:    I quit smoking about 40 years ago.  Vaping Use   Vaping status: Never Used  Substance Use Topics   Alcohol use: No   Drug use: Not Currently     Medication list has been reviewed and updated.  Current Meds  Medication Sig   amoxicillin-clavulanate (AUGMENTIN) 875-125 MG tablet Take 1 tablet by mouth 2 (two) times daily for 10 days.   aspirin 81 MG chewable tablet Chew by mouth daily.   cholecalciferol (VITAMIN D) 1000 units tablet Take 1,000 Units by mouth daily.    diclofenac (VOLTAREN) 50 MG EC tablet Take 50 mg by mouth 2 (two) times daily.   dicyclomine (BENTYL) 10 MG capsule Take 1 capsule (10 mg total) by mouth QID.   glucose blood (ONETOUCH VERIO) test strip USE 1 TEST STRIP TWICE DAILY   glucose blood test strip USE TO TEST BLOOD SUGAR TWICE DAILY   Lancets (ONETOUCH DELICA PLUS LANCET33G) MISC Apply topically 2 (two) times daily.   omeprazole (PRILOSEC) 40 MG capsule TAKE 1 CAPSULE BY MOUTH EVERY DAY   rosuvastatin (CRESTOR) 40 MG tablet Take 40 mg by mouth daily.   sitaGLIPtin-metformin (JANUMET) 50-1000 MG tablet Take 1 tablet by mouth 2 (two) times daily with a meal.   SPIRIVA RESPIMAT 2.5 MCG/ACT AERS SMARTSIG:2 Puff(s) Via Inhaler Daily       07/09/2023   10:28 AM 11/21/2022    8:17 AM 07/23/2022    9:57 AM 12/27/2021    9:03 AM  GAD 7 : Generalized Anxiety Score  Nervous, Anxious, on Edge 0 0 0 0  Control/stop worrying 0 0 0 0  Worry too much - different things 0 0 0 0  Trouble relaxing 0 0 0 0  Restless 0 0 0 0  Easily annoyed or irritable 0 0 0 0  Afraid - awful might happen 0 0 0 0  Total GAD 7 Score 0 0 0 0  Anxiety Difficulty Not difficult at all Not difficult at all Not difficult at all Not difficult at all       07/09/2023   10:27 AM 01/08/2023    9:51 AM 11/21/2022    8:17 AM  Depression screen PHQ 2/9  Decreased Interest 0 0 0  Down, Depressed, Hopeless 0 0 0  PHQ - 2 Score 0 0 0  Altered sleeping 0 0 0  Tired, decreased energy 0 0 0  Change in appetite 0 0 0  Feeling bad or failure about yourself  0 0 0  Trouble concentrating 0 0 0  Moving slowly or fidgety/restless 0 0 0  Suicidal thoughts 0 0 0  PHQ-9 Score 0 0 0  Difficult doing work/chores Not difficult at all Not difficult at all Not difficult at all    BP Readings from Last 3 Encounters:  07/09/23 114/74  01/08/23 120/70  11/21/22 128/78    Physical Exam Vitals and nursing note reviewed.  Constitutional:      General: He is not in acute distress.     Appearance: Normal appearance. He is well-developed.  HENT:     Head: Normocephalic and atraumatic.     Right Ear: Tympanic membrane normal.     Left Ear: Tympanic membrane normal.  Nose:     Right Sinus: No maxillary sinus tenderness or frontal sinus tenderness.     Left Sinus: No maxillary sinus tenderness or frontal sinus tenderness.     Mouth/Throat:     Mouth: Mucous membranes are moist. No oral lesions.     Pharynx: Oropharynx is clear.  Neck:     Vascular: No carotid bruit.      Comments: 1 cm mobile, smooth, firm mass c/w lymph node Cardiovascular:     Rate and Rhythm: Normal rate and regular rhythm.     Heart sounds: No murmur heard. Pulmonary:     Effort: Pulmonary effort is normal. No respiratory distress.     Breath sounds: Normal breath sounds. No wheezing or rhonchi.  Musculoskeletal:     Cervical back: Normal range of motion.  Lymphadenopathy:     Cervical: Cervical adenopathy present.  Skin:    General: Skin is warm and dry.     Findings: No rash.  Neurological:     Mental Status: He is alert and oriented to person, place, and time.  Psychiatric:        Mood and Affect: Mood normal.        Behavior: Behavior normal.     Wt Readings from Last 3 Encounters:  07/09/23 179 lb 8 oz (81.4 kg)  01/08/23 183 lb 12.8 oz (83.4 kg)  11/21/22 185 lb (83.9 kg)    BP 114/74   Pulse 87   Ht 5\' 10"  (1.778 m)   Wt 179 lb 8 oz (81.4 kg)   SpO2 100%   BMI 25.76 kg/m   Assessment and Plan:  Problem List Items Addressed This Visit       Unprioritized   Type II diabetes mellitus with complication (HCC) (Chronic)   Recent A1C 7.6 on Janumet.      Hyperlipidemia associated with type 2 diabetes mellitus (HCC) (Chronic)   LDL is  Lab Results  Component Value Date   LDLCALC 58 11/21/2022   Current regimen is Crestor.  No medication side effects noted. Goal LDL is <70.       Stage 3b chronic kidney disease (HCC)   Other Visit Diagnoses        Submandibular lymphadenopathy    -  Primary   suspect this is due to dental infection will treat with augmentin - pt to message if no improvement for ENT referral in [facility]   Relevant Medications   amoxicillin-clavulanate (AUGMENTIN) 875-125 MG tablet     Dental infection       encourage dental follow up and treatment   Relevant Medications   amoxicillin-clavulanate (AUGMENTIN) 875-125 MG tablet     Long term current use of oral hypoglycemic drug           No follow-ups on file.    Reubin Milan, MD [facility] [place]

## 2023-07-09 NOTE — Assessment & Plan Note (Signed)
 LDL is  Lab Results  Component Value Date   LDLCALC 58 11/21/2022   Current regimen is Crestor.  No medication side effects noted. Goal LDL is <70.

## 2023-07-09 NOTE — Assessment & Plan Note (Signed)
 Recent A1C 7.6 on Janumet.

## 2023-07-11 LAB — MICROALBUMIN / CREATININE URINE RATIO: Microalb Creat Ratio: 45.6

## 2023-07-11 LAB — COMPREHENSIVE METABOLIC PANEL WITH GFR
Calcium: 9.6 (ref 8.7–10.7)
eGFR: 47

## 2023-07-11 LAB — LIPID PANEL
Cholesterol: 103 (ref 0–200)
HDL: 43 (ref 35–70)
LDL Cholesterol: 45
Triglycerides: 57 (ref 40–160)

## 2023-07-11 LAB — BASIC METABOLIC PANEL WITH GFR
BUN: 15 (ref 4–21)
CO2: 22 (ref 13–22)
Chloride: 105 (ref 99–108)
Creatinine: 1.5 — AB (ref 0.6–1.3)
Glucose: 104
Potassium: 4.6 meq/L (ref 3.5–5.1)
Sodium: 135 — AB (ref 137–147)

## 2023-07-11 LAB — PROTEIN / CREATININE RATIO, URINE: Albumin, U: 37.3

## 2023-07-11 LAB — HEMOGLOBIN A1C: Hemoglobin A1C: 7.7

## 2023-08-01 LAB — HM DIABETES EYE EXAM

## 2023-08-08 ENCOUNTER — Ambulatory Visit (INDEPENDENT_AMBULATORY_CARE_PROVIDER_SITE_OTHER): Payer: Self-pay | Admitting: Internal Medicine

## 2023-08-08 ENCOUNTER — Encounter: Payer: Self-pay | Admitting: Internal Medicine

## 2023-08-08 VITALS — BP 114/72 | HR 90 | Ht 70.0 in | Wt 178.0 lb

## 2023-08-08 DIAGNOSIS — N401 Enlarged prostate with lower urinary tract symptoms: Secondary | ICD-10-CM

## 2023-08-08 DIAGNOSIS — E1169 Type 2 diabetes mellitus with other specified complication: Secondary | ICD-10-CM

## 2023-08-08 DIAGNOSIS — Z7984 Long term (current) use of oral hypoglycemic drugs: Secondary | ICD-10-CM

## 2023-08-08 DIAGNOSIS — N138 Other obstructive and reflux uropathy: Secondary | ICD-10-CM

## 2023-08-08 DIAGNOSIS — Z0000 Encounter for general adult medical examination without abnormal findings: Secondary | ICD-10-CM | POA: Diagnosis not present

## 2023-08-08 DIAGNOSIS — E785 Hyperlipidemia, unspecified: Secondary | ICD-10-CM

## 2023-08-08 DIAGNOSIS — N1832 Chronic kidney disease, stage 3b: Secondary | ICD-10-CM

## 2023-08-08 DIAGNOSIS — E118 Type 2 diabetes mellitus with unspecified complications: Secondary | ICD-10-CM | POA: Diagnosis not present

## 2023-08-08 DIAGNOSIS — K5901 Slow transit constipation: Secondary | ICD-10-CM

## 2023-08-08 MED ORDER — DICYCLOMINE HCL 10 MG PO CAPS: 10.0000 mg | ORAL_CAPSULE | Freq: Four times a day (QID) | ORAL | 0 refills | Status: AC

## 2023-08-08 NOTE — Assessment & Plan Note (Addendum)
 Followed by Endo Blood sugars have been stable.  No recent hypoglycemic events requiring assistance. Currently medications are Janumet. Lab Results  Component Value Date   HGBA1C 7.7 07/11/2023   Last visit no changes were made. Recent eye exam was normal.

## 2023-08-08 NOTE — Assessment & Plan Note (Addendum)
 LDL is  Lab Results  Component Value Date   LDLCALC 45 07/11/2023   Current regimen is Crestor  40 mg.  No medication side effects noted. Goal LDL is <70.

## 2023-08-08 NOTE — Assessment & Plan Note (Signed)
 Followed by Urology - appointment in the near future Will get PSA for monitoring He continues on Proscar with some improvement in nocturia

## 2023-08-08 NOTE — Assessment & Plan Note (Signed)
 Stable; most recent GFR 47

## 2023-08-08 NOTE — Progress Notes (Signed)
 Date:  08/08/2023   Name:  Marc Bond   DOB:  11-27-1945   MRN:  [id number]   Chief Complaint: Annual Exam Marc Bond is a 78 y.o. male who presents today for his Complete Annual Exam. He feels well. He reports exercising walks 2 miles a day. He reports he is sleeping fairly well.   Health Maintenance  Topic Date Due   COVID-19 Vaccine (4 - 2024-25 season) 08/24/2023*   Flu Shot  11/15/2023   Medicare Annual Wellness Visit  01/08/2024   Hemoglobin A1C  01/11/2024   Yearly kidney function blood test for diabetes  07/10/2024   Yearly kidney health urinalysis for diabetes  07/10/2024   Eye exam for diabetics  07/31/2024   Complete foot exam   08/07/2024   DTaP/Tdap/Td vaccine (2 - Td or Tdap) 10/25/2027   Pneumonia Vaccine  Completed   Zoster (Shingles) Vaccine  Completed   Hepatitis C Screening  Addressed   HPV Vaccine  Aged Out   Meningitis B Vaccine  Aged Out   Colon Cancer Screening  Discontinued  *Topic was postponed. The date shown is not the original due date.    Lab Results  Component Value Date   PSA1 2.2 07/18/2021   PSA1 2.7 05/11/2019   PSA1 1.6 09/29/2015    Diabetes He presents for his follow-up diabetic visit. His disease course has been stable. Pertinent negatives for hypoglycemia include no dizziness, headaches or nervousness/anxiousness. Pertinent negatives for diabetes include no chest pain, no fatigue and no weakness. Current diabetic treatment includes oral agent (dual therapy).  Hyperlipidemia This is a chronic problem. The problem is controlled. Pertinent negatives include no chest pain or shortness of breath. Current antihyperlipidemic treatment includes statins. The current treatment provides significant improvement of lipids.    Review of Systems  Constitutional:  Negative for fatigue and unexpected weight change.  HENT:  Negative for nosebleeds.   Eyes:  Negative for visual disturbance.  Respiratory:  Negative for cough, chest tightness,  shortness of breath and wheezing.   Cardiovascular:  Negative for chest pain, palpitations and leg swelling.  Gastrointestinal:  Negative for abdominal pain, constipation and diarrhea.  Genitourinary:  Negative for dysuria and hematuria.  Musculoskeletal:  Positive for back pain and gait problem.  Skin:  Negative for color change and rash.  Neurological:  Negative for dizziness, weakness, light-headedness and headaches.  Psychiatric/Behavioral:  Negative for dysphoric mood and sleep disturbance. The patient is not nervous/anxious.      Lab Results  Component Value Date   NA 135 (A) 07/11/2023   K 4.6 07/11/2023   CO2 22 07/11/2023   GLUCOSE 127 (H) 11/21/2022   BUN 15 07/11/2023   CREATININE 1.5 (A) 07/11/2023   CALCIUM  9.6 07/11/2023   EGFR 47 07/11/2023   GFRNONAA 40 (L) 02/10/2020   Lab Results  Component Value Date   CHOL 103 07/11/2023   HDL 43 07/11/2023   LDLCALC 45 07/11/2023   TRIG 57 07/11/2023   CHOLHDL 2.4 11/21/2022   Lab Results  Component Value Date   TSH 1.930 07/18/2021   Lab Results  Component Value Date   HGBA1C 7.7 07/11/2023   Lab Results  Component Value Date   WBC 7.7 07/23/2022   HGB 12.3 (L) 07/23/2022   HCT 37.0 (L) 07/23/2022   MCV 80 07/23/2022   PLT 221 07/23/2022   Lab Results  Component Value Date   ALT 10 11/21/2022   AST 15 11/21/2022   ALKPHOS 74 11/21/2022  BILITOT 0.4 11/21/2022   Lab Results  Component Value Date   VD25OH 65.0 02/10/2020     Patient Active Problem List   Diagnosis Date Noted   1st degree AV block 05/31/2020   Stage 3b chronic kidney disease (HCC) 10/13/2019   Ulnar impingement syndrome, left 10/27/2018   Renal cyst, right 10/29/2016   Pulmonary cavitary lesion 10/16/2016   PPD positive, treated 10/16/2016   Degenerative disc disease, lumbar 09/29/2015   Primary osteoarthritis of right knee 09/29/2015   Irritable colon 03/12/2015   Type II diabetes mellitus with complication (HCC) 03/12/2015    Hyperlipidemia associated with type 2 diabetes mellitus (HCC) 03/12/2015   BPH with obstruction/lower urinary tract symptoms 03/12/2015   Failure of erection 03/12/2015   Seborrhea capitis 03/12/2015   Peptic ulcer disease 03/12/2015   Acquired deformities of toe 03/03/2012   Bilateral cataracts 12/10/2011    Allergies  Allergen Reactions   Ace Inhibitors Other (See Comments)    Sx hypotension on low dose    Past Surgical History:  Procedure Laterality Date   COLONOSCOPY  11/2011   normal - 10 yr follow up Dr. Marylynn Soho   PROSTATE SURGERY      Social History   Tobacco Use   Smoking status: Former    Current packs/day: 0.00    Average packs/day: 0.5 packs/day for 13.0 years (6.5 ttl pk-yrs)    Types: Cigarettes, Pipe, Cigars    Start date: 04/16/1965    Quit date: 04/16/1978    Years since quitting: 45.3   Smokeless tobacco: Never   Tobacco comments:    I quit smoking about 40 years ago.  Vaping Use   Vaping status: Never Used  Substance Use Topics   Alcohol use: No   Drug use: Not Currently     Medication list has been reviewed and updated.  Current Meds  Medication Sig   aspirin 81 MG chewable tablet Chew by mouth daily.   cholecalciferol (VITAMIN D ) 1000 units tablet Take 1,000 Units by mouth daily.   diclofenac (VOLTAREN) 50 MG EC tablet Take 50 mg by mouth 2 (two) times daily.   finasteride (PROSCAR) 5 MG tablet Take 5 mg by mouth daily.   glucose blood (ONETOUCH VERIO) test strip USE 1 TEST STRIP TWICE DAILY   glucose blood test strip USE TO TEST BLOOD SUGAR TWICE DAILY   Lancets (ONETOUCH DELICA PLUS LANCET33G) MISC Apply topically 2 (two) times daily.   omeprazole  (PRILOSEC) 40 MG capsule TAKE 1 CAPSULE BY MOUTH EVERY DAY   rosuvastatin  (CRESTOR ) 40 MG tablet Take 40 mg by mouth daily.   sitaGLIPtin-metformin (JANUMET) 50-1000 MG tablet Take 1 tablet by mouth 2 (two) times daily with a meal.   SPIRIVA RESPIMAT 2.5 MCG/ACT AERS SMARTSIG:2 Puff(s) Via Inhaler  Daily   [DISCONTINUED] dicyclomine  (BENTYL ) 10 MG capsule Take 1 capsule (10 mg total) by mouth QID.       08/08/2023    9:42 AM 07/09/2023   10:28 AM 11/21/2022    8:17 AM 07/23/2022    9:57 AM  GAD 7 : Generalized Anxiety Score  Nervous, Anxious, on Edge 0 0 0 0  Control/stop worrying 0 0 0 0  Worry too much - different things 0 0 0 0  Trouble relaxing 0 0 0 0  Restless 0 0 0 0  Easily annoyed or irritable 0 0 0 0  Afraid - awful might happen 0 0 0 0  Total GAD 7 Score 0 0 0 0  Anxiety  Difficulty Not difficult at all Not difficult at all Not difficult at all Not difficult at all       08/08/2023    9:42 AM 07/09/2023   10:27 AM 01/08/2023    9:51 AM  Depression screen PHQ 2/9  Decreased Interest 0 0 0  Down, Depressed, Hopeless 0 0 0  PHQ - 2 Score 0 0 0  Altered sleeping 0 0 0  Tired, decreased energy 0 0 0  Change in appetite 1 0 0  Feeling bad or failure about yourself  0 0 0  Trouble concentrating 0 0 0  Moving slowly or fidgety/restless 0 0 0  Suicidal thoughts 0 0 0  PHQ-9 Score 1 0 0  Difficult doing work/chores Not difficult at all Not difficult at all Not difficult at all    BP Readings from Last 3 Encounters:  08/08/23 114/72  07/09/23 114/74  01/08/23 120/70    Physical Exam Vitals and nursing note reviewed.  Constitutional:      Appearance: Normal appearance. He is well-developed.  HENT:     Head: Normocephalic.     Right Ear: Tympanic membrane, ear canal and external ear normal.     Left Ear: Tympanic membrane, ear canal and external ear normal.     Nose: Nose normal.  Eyes:     Conjunctiva/sclera: Conjunctivae normal.     Pupils: Pupils are equal, round, and reactive to light.  Neck:     Thyroid : No thyromegaly.     Vascular: No carotid bruit.  Cardiovascular:     Rate and Rhythm: Normal rate and regular rhythm.     Heart sounds: Normal heart sounds.  Pulmonary:     Effort: Pulmonary effort is normal.     Breath sounds: Normal breath sounds.  No wheezing.  Chest:  Breasts:    Right: No mass.     Left: No mass.  Abdominal:     General: Bowel sounds are normal.     Palpations: Abdomen is soft.     Tenderness: There is no abdominal tenderness.  Musculoskeletal:        General: Normal range of motion.     Cervical back: Normal range of motion and neck supple.  Lymphadenopathy:     Cervical: No cervical adenopathy.  Skin:    General: Skin is warm and dry.     Capillary Refill: Capillary refill takes less than 2 seconds.  Neurological:     Mental Status: He is alert and oriented to person, place, and time.     Deep Tendon Reflexes: Reflexes are normal and symmetric.  Psychiatric:        Attention and Perception: Attention normal.        Mood and Affect: Mood normal.        Thought Content: Thought content normal.    Diabetic Foot Exam - Simple   Simple Foot Form Diabetic Foot exam was performed with the following findings: Yes 08/08/2023 10:00 AM  Visual Inspection No deformities, no ulcerations, no other skin breakdown bilaterally: Yes Sensation Testing Intact to touch and monofilament testing bilaterally: Yes Pulse Check Posterior Tibialis and Dorsalis pulse intact bilaterally: Yes Comments      Wt Readings from Last 3 Encounters:  08/08/23 178 lb (80.7 kg)  07/09/23 179 lb 8 oz (81.4 kg)  01/08/23 183 lb 12.8 oz (83.4 kg)    BP 114/72   Pulse 90   Ht 5\' 10"  (1.778 m)   Wt 178 lb (80.7 kg)  SpO2 98%   BMI 25.54 kg/m   Assessment and Plan:  Problem List Items Addressed This Visit       Unprioritized   Type II diabetes mellitus with complication (HCC) - Primary (Chronic)   Followed by Endo Blood sugars have been stable.  No recent hypoglycemic events requiring assistance. Currently medications are Janumet. Lab Results  Component Value Date   HGBA1C 7.7 07/11/2023   Last visit no changes were made. Recent eye exam was normal.       Relevant Orders   Microalbumin / creatinine urine ratio    Hyperlipidemia associated with type 2 diabetes mellitus (HCC) (Chronic)   LDL is  Lab Results  Component Value Date   LDLCALC 45 07/11/2023   Current regimen is Crestor  40 mg.  No medication side effects noted. Goal LDL is <70.       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)   Followed by Urology - appointment in the near future Will get PSA for monitoring He continues on Proscar with some improvement in nocturia      Relevant Medications   finasteride (PROSCAR) 5 MG tablet   Other Relevant Orders   PSA   Stage 3b chronic kidney disease (HCC)   Stable; most recent GFR 47       Relevant Orders   CBC with Differential/Platelet   Other Visit Diagnoses       Annual physical exam       up to date on screenings and immunizations     Slow transit constipation       Relevant Medications   dicyclomine  (BENTYL ) 10 MG capsule     Long term current use of oral hypoglycemic drug           Return in about 6 months (around 02/07/2024) for DM.    Sheron Dixons, MD [facility] [place]

## 2023-08-09 ENCOUNTER — Encounter: Payer: Self-pay | Admitting: Internal Medicine

## 2023-08-09 LAB — CBC WITH DIFFERENTIAL/PLATELET
Basophils Absolute: 0 10*3/uL (ref 0.0–0.2)
Basos: 1 %
EOS (ABSOLUTE): 0.1 10*3/uL (ref 0.0–0.4)
Eos: 1 %
Hematocrit: 36.9 % — ABNORMAL LOW (ref 37.5–51.0)
Hemoglobin: 12.3 g/dL — ABNORMAL LOW (ref 13.0–17.7)
Immature Grans (Abs): 0 10*3/uL (ref 0.0–0.1)
Immature Granulocytes: 0 %
Lymphocytes Absolute: 2 10*3/uL (ref 0.7–3.1)
Lymphs: 27 %
MCH: 27.3 pg (ref 26.6–33.0)
MCHC: 33.3 g/dL (ref 31.5–35.7)
MCV: 82 fL (ref 79–97)
Monocytes Absolute: 0.7 10*3/uL (ref 0.1–0.9)
Monocytes: 9 %
Neutrophils Absolute: 4.8 10*3/uL (ref 1.4–7.0)
Neutrophils: 62 %
Platelets: 182 10*3/uL (ref 150–450)
RBC: 4.51 x10E6/uL (ref 4.14–5.80)
RDW: 13.2 % (ref 11.6–15.4)
WBC: 7.6 10*3/uL (ref 3.4–10.8)

## 2023-08-09 LAB — HEPATIC FUNCTION PANEL
ALT: 8 IU/L (ref 0–44)
AST: 14 IU/L (ref 0–40)
Albumin: 4.5 g/dL (ref 3.8–4.8)
Alkaline Phosphatase: 65 IU/L (ref 44–121)
Bilirubin Total: 0.6 mg/dL (ref 0.0–1.2)
Bilirubin, Direct: 0.22 mg/dL (ref 0.00–0.40)
Total Protein: 7.6 g/dL (ref 6.0–8.5)

## 2023-08-09 LAB — PSA: Prostate Specific Ag, Serum: 2.5 ng/mL (ref 0.0–4.0)

## 2023-08-09 LAB — MICROALBUMIN / CREATININE URINE RATIO
Creatinine, Urine: 96.3 mg/dL
Microalb/Creat Ratio: 31 mg/g{creat} — ABNORMAL HIGH (ref 0–29)
Microalbumin, Urine: 29.7 ug/mL

## 2023-09-01 ENCOUNTER — Other Ambulatory Visit: Payer: Self-pay | Admitting: Internal Medicine

## 2023-09-03 NOTE — Telephone Encounter (Signed)
 Med refill

## 2023-09-03 NOTE — Telephone Encounter (Signed)
 Requested medication (s) are due for refill today - expired Rx  Requested medication (s) are on the active medication list -yes  Future visit scheduled -=yes  Last refill: 08/02/22  Notes to clinic: listed as historical medication   Requested Prescriptions  Pending Prescriptions Disp Refills   rosuvastatin  (CRESTOR ) 40 MG tablet [Pharmacy Med Name: ROSUVASTATIN  40MG  TABLETS] 90 tablet     Sig: TAKE 1 TABLET BY MOUTH EVERY DAY     Cardiovascular:  Antilipid - Statins 2 Failed - 09/03/2023  2:23 PM      Failed - Cr in normal range and within 360 days    Creatinine  Date Value Ref Range Status  07/11/2023 1.5 (A) 0.6 - 1.3 Final   Creatinine, Ser  Date Value Ref Range Status  11/21/2022 1.49 (H) 0.76 - 1.27 mg/dL Final         Failed - Lipid Panel in normal range within the last 12 months    Cholesterol, Total  Date Value Ref Range Status  11/21/2022 122 100 - 199 mg/dL Final   Cholesterol  Date Value Ref Range Status  07/11/2023 103 0 - 200 Final   LDL Chol Calc (NIH)  Date Value Ref Range Status  11/21/2022 58 0 - 99 mg/dL Final   LDL Cholesterol  Date Value Ref Range Status  07/11/2023 45  Final   HDL  Date Value Ref Range Status  07/11/2023 43 35 - 70 Final  11/21/2022 51 >39 mg/dL Final   Triglycerides  Date Value Ref Range Status  07/11/2023 57 40 - 160 Final         Passed - Patient is not pregnant      Passed - Valid encounter within last 12 months    Recent Outpatient Visits           3 weeks ago Type II diabetes mellitus with complication (HCC)   [place] [facility] at [facility], Chales [place], MD   1 month ago Submandibular lymphadenopathy   [place] [facility] at [facility], Chales [place], MD       Future Appointments             In 5 months Sheron Dixons, MD [facility] Medicine at [facility], [facility]               Requested  Prescriptions  Pending Prescriptions Disp Refills   rosuvastatin  (CRESTOR ) 40 MG tablet [Pharmacy Med Name: ROSUVASTATIN  40MG  TABLETS] 90 tablet     Sig: TAKE 1 TABLET BY MOUTH EVERY DAY     Cardiovascular:  Antilipid - Statins 2 Failed - 09/03/2023  2:23 PM      Failed - Cr in normal range and within 360 days    Creatinine  Date Value Ref Range Status  07/11/2023 1.5 (A) 0.6 - 1.3 Final   Creatinine, Ser  Date Value Ref Range Status  11/21/2022 1.49 (H) 0.76 - 1.27 mg/dL Final         Failed - Lipid Panel in normal range within the last 12 months    Cholesterol, Total  Date Value Ref Range Status  11/21/2022 122 100 - 199 mg/dL Final   Cholesterol  Date Value Ref Range Status  07/11/2023 103 0 - 200 Final   LDL Chol Calc (NIH)  Date Value Ref Range Status  11/21/2022 58 0 - 99 mg/dL Final   LDL Cholesterol  Date Value  Ref Range Status  07/11/2023 45  Final   HDL  Date Value Ref Range Status  07/11/2023 43 35 - 70 Final  11/21/2022 51 >39 mg/dL Final   Triglycerides  Date Value Ref Range Status  07/11/2023 57 40 - 160 Final         Passed - Patient is not pregnant      Passed - Valid encounter within last 12 months    Recent Outpatient Visits           3 weeks ago Type II diabetes mellitus with complication [facility])   [place] [facility] at [facility] Chales [place], MD   1 month ago Submandibular lymphadenopathy    Primary Care & Sports Medicine at [facility], Chales [place], MD       Future Appointments             In 5 months Gala Jubilee, Chales [place], MD [facility] at [facility], [facility]

## 2023-11-30 ENCOUNTER — Other Ambulatory Visit: Payer: Self-pay | Admitting: Internal Medicine

## 2023-11-30 DIAGNOSIS — K279 Peptic ulcer, site unspecified, unspecified as acute or chronic, without hemorrhage or perforation: Secondary | ICD-10-CM

## 2023-12-03 NOTE — Telephone Encounter (Signed)
 Requested Prescriptions  Pending Prescriptions Disp Refills   omeprazole  (PRILOSEC) 40 MG capsule [Pharmacy Med Name: OMEPRAZOLE  40MG  CAPSULES] 90 capsule 1    Sig: TAKE 1 CAPSULE BY MOUTH EVERY DAY     Gastroenterology: Proton Pump Inhibitors Passed - 12/03/2023 12:30 PM      Passed - Valid encounter within last 12 months    Recent Outpatient Visits           3 months ago Type II diabetes mellitus with complication [facility])   [place] [facility] at [facility] Leita DEL, MD   4 months ago Submandibular lymphadenopathy   [place] [facility] at [facility], Leita DEL, MD       Future Appointments             In 2 months Justus, Leita DEL, MD [facility] Primary [facility] Medicine at [facility], [facility]

## 2023-12-27 ENCOUNTER — Telehealth: Payer: Self-pay | Admitting: Internal Medicine

## 2023-12-27 NOTE — Telephone Encounter (Signed)
 Copied from CRM #[id number]. Topic: Medicare AWV >> Dec 27, 2023  8:55 AM Nathanel DEL wrote: Reason for CRM: Called 12/26/2023 to sched AWV - MAILBOX FULL  Nathanel Paschal; Care Guide Ambulatory Clinical Support [place] [facility] Direct Dial: [phone]

## 2024-01-15 LAB — HEMOGLOBIN A1C
Hemoglobin A1C: 7.5
Hemoglobin A1C: 7.7

## 2024-02-06 ENCOUNTER — Encounter: Payer: Self-pay | Admitting: Internal Medicine

## 2024-02-07 ENCOUNTER — Ambulatory Visit (INDEPENDENT_AMBULATORY_CARE_PROVIDER_SITE_OTHER): Admitting: Internal Medicine

## 2024-02-07 ENCOUNTER — Encounter: Payer: Self-pay | Admitting: Internal Medicine

## 2024-02-07 VITALS — BP 122/76 | HR 97 | Ht 70.0 in | Wt 175.0 lb

## 2024-02-07 DIAGNOSIS — E1169 Type 2 diabetes mellitus with other specified complication: Secondary | ICD-10-CM | POA: Diagnosis not present

## 2024-02-07 DIAGNOSIS — Z7984 Long term (current) use of oral hypoglycemic drugs: Secondary | ICD-10-CM

## 2024-02-07 DIAGNOSIS — E118 Type 2 diabetes mellitus with unspecified complications: Secondary | ICD-10-CM

## 2024-02-07 DIAGNOSIS — H6121 Impacted cerumen, right ear: Secondary | ICD-10-CM

## 2024-02-07 DIAGNOSIS — E785 Hyperlipidemia, unspecified: Secondary | ICD-10-CM

## 2024-02-07 MED ORDER — ROSUVASTATIN CALCIUM 40 MG PO TABS: 40.0000 mg | ORAL_TABLET | Freq: Every day | ORAL | 0 refills | Status: DC

## 2024-02-07 NOTE — Assessment & Plan Note (Signed)
 Currently medications are Janumet.  No hypoglycemic episodes noted. Home blood sugars in the 130 range. Last visit medical regimen changes were none. Recent A1C stable.  Continue current regimen. Lab Results  Component Value Date   HGBA1C 7.7 01/15/2024   HGBA1C 7.5 01/15/2024

## 2024-02-07 NOTE — Patient Instructions (Signed)
 Gerome Connee Browning, MD  [address]  [place], [place] [address]  Phone: tel:[phone]  fax:[phone]   Excessive ear wax, ringing in the ear and decreased hearing

## 2024-02-07 NOTE — Progress Notes (Signed)
 Date:  02/07/2024   Name:  Marc Bond   DOB:  20-Jan-1946   MRN:  [id number]   Chief Complaint: Diabetes  Diabetes He presents for his follow-up diabetic visit. He has type 2 diabetes mellitus. His disease course has been stable. Pertinent negatives for hypoglycemia include no dizziness, headaches or nervousness/anxiousness. Pertinent negatives for diabetes include no chest pain, no fatigue and no weakness. Current diabetic treatments: MTF and Januvia.  Ear Fullness  There is pain in the right ear. This is a chronic problem. The current episode started 1 to 4 weeks ago. The problem occurs constantly. The problem has been unchanged. Associated symptoms include hearing loss. Pertinent negatives include no abdominal pain, coughing, diarrhea or headaches. Associated symptoms comments: Ringing in ear and possible hearing loss Treated with antibiotics one month ago at [facility].    Review of Systems  Constitutional:  Negative for fatigue and unexpected weight change.  HENT:  Positive for hearing loss and tinnitus. Negative for nosebleeds and trouble swallowing.   Eyes:  Negative for visual disturbance.  Respiratory:  Negative for cough, chest tightness, shortness of breath and wheezing.   Cardiovascular:  Negative for chest pain, palpitations and leg swelling.  Gastrointestinal:  Negative for abdominal pain, constipation and diarrhea.  Neurological:  Negative for dizziness, weakness, light-headedness and headaches.  Psychiatric/Behavioral:  Negative for dysphoric mood and sleep disturbance. The patient is not nervous/anxious.      Lab Results  Component Value Date   NA 135 (A) 07/11/2023   K 4.6 07/11/2023   CO2 22 07/11/2023   GLUCOSE 127 (H) 11/21/2022   BUN 15 07/11/2023   CREATININE 1.5 (A) 07/11/2023   CALCIUM  9.6 07/11/2023   EGFR 47 07/11/2023   GFRNONAA 40 (L) 02/10/2020   Lab Results  Component Value Date   CHOL 103 07/11/2023   HDL 43 07/11/2023   LDLCALC 45 07/11/2023    TRIG 57 07/11/2023   CHOLHDL 2.4 11/21/2022   Lab Results  Component Value Date   TSH 1.930 07/18/2021   Lab Results  Component Value Date   HGBA1C 7.7 01/15/2024   HGBA1C 7.5 01/15/2024   Lab Results  Component Value Date   WBC 7.6 08/08/2023   HGB 12.3 (L) 08/08/2023   HCT 36.9 (L) 08/08/2023   MCV 82 08/08/2023   PLT 182 08/08/2023   Lab Results  Component Value Date   ALT 8 08/08/2023   AST 14 08/08/2023   ALKPHOS 65 08/08/2023   BILITOT 0.6 08/08/2023   Lab Results  Component Value Date   VD25OH 65.0 02/10/2020     Patient Active Problem List   Diagnosis Date Noted   1st degree AV block 05/31/2020   Stage 3b chronic kidney disease (HCC) 10/13/2019   Ulnar impingement syndrome, left 10/27/2018   Renal cyst, right 10/29/2016   Pulmonary cavitary lesion 10/16/2016   PPD positive, treated 10/16/2016   Degenerative disc disease, lumbar 09/29/2015   Primary osteoarthritis of right knee 09/29/2015   Irritable colon 03/12/2015   Type II diabetes mellitus with complication (HCC) 03/12/2015   Hyperlipidemia associated with type 2 diabetes mellitus (HCC) 03/12/2015   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 Noel   PROSTATE SURGERY      Social History   Tobacco Use   Smoking status: Former    Current packs/day: 0.00    Average packs/day: 0.5 packs/day for 13.0 years (6.5 ttl pk-yrs)    Types: Cigarettes, Pipe, Cigars    Start date: 04/16/1965    Quit date: 04/16/1978    Years since quitting: 45.8   Smokeless tobacco: Never   Tobacco comments:    I quit  smoking about 40 years ago.  Vaping Use   Vaping status: Never Used  Substance Use Topics   Alcohol use: No   Drug use: Not Currently     Medication list has been reviewed and updated.  Current Meds  Medication Sig   aspirin 81 MG chewable tablet Chew by mouth daily.   cholecalciferol (VITAMIN D ) 1000 units tablet Take 1,000 Units by mouth daily.   diclofenac (VOLTAREN) 50 MG EC tablet Take 50 mg by mouth 2 (two) times daily.   dicyclomine  (BENTYL ) 10 MG capsule Take 1 capsule (10 mg total) by mouth QID.   finasteride (PROSCAR) 5 MG tablet Take 5 mg by mouth daily.   glucose blood (ONETOUCH VERIO) test strip USE 1 TEST STRIP TWICE DAILY   glucose blood test strip USE TO TEST BLOOD SUGAR TWICE DAILY   Lancets (ONETOUCH DELICA PLUS LANCET33G) MISC Apply topically 2 (two) times daily.   omeprazole  (PRILOSEC) 40 MG capsule TAKE 1 CAPSULE BY MOUTH EVERY DAY   sitaGLIPtin-metformin (JANUMET) 50-1000 MG tablet Take 1 tablet by mouth 2 (two) times daily with a meal.   SPIRIVA RESPIMAT 2.5 MCG/ACT AERS SMARTSIG:2 Puff(s) Via Inhaler Daily   [DISCONTINUED] rosuvastatin  (CRESTOR ) 40 MG tablet TAKE 1 TABLET BY MOUTH EVERY DAY       02/07/2024    9:38 AM 08/08/2023    9:42 AM 07/09/2023   10:28 AM 11/21/2022    8:17 AM  GAD 7 : Generalized Anxiety Score  Nervous, Anxious, on Edge 0 0 0 0  Control/stop worrying 0 0 0 0  Worry too much - different things 0 0 0 0  Trouble relaxing 0 0 0 0  Restless 0 0 0 0  Easily annoyed or irritable 0 0 0 0  Afraid - awful might happen 0 0 0 0  Total GAD 7 Score 0 0 0 0  Anxiety Difficulty Not difficult at all Not difficult at all Not difficult at all Not difficult at all       02/07/2024    9:38 AM 08/08/2023    9:42 AM 07/09/2023   10:27 AM  Depression screen PHQ 2/9  Decreased Interest 0 0 0  Down, Depressed, Hopeless 0 0 0  PHQ - 2 Score 0 0 0  Altered sleeping 0 0 0  Tired, decreased energy 0 0 0  Change in appetite 0 1 0  Feeling bad or  failure about yourself  0 0 0  Trouble concentrating 0 0 0  Moving slowly or fidgety/restless 0 0 0  Suicidal thoughts 0 0 0  PHQ-9 Score 0 1 0  Difficult doing work/chores Not difficult at all Not difficult at all Not difficult at all    BP Readings from Last 3 Encounters:  02/07/24 122/76  08/08/23 114/72  07/09/23 114/74    Physical Exam Vitals and nursing note reviewed.  Constitutional:  General: He is not in acute distress.    Appearance: Normal appearance. He is well-developed.  HENT:     Head: Normocephalic and atraumatic.     Right Ear: There is impacted cerumen.     Left Ear: Tympanic membrane and ear canal normal.  Cardiovascular:     Rate and Rhythm: Normal rate and regular rhythm.  Pulmonary:     Effort: Pulmonary effort is normal. No respiratory distress.     Breath sounds: No wheezing or rhonchi.  Musculoskeletal:        General: Normal range of motion.     Cervical back: Normal range of motion.  Skin:    General: Skin is warm and dry.     Findings: No rash.  Neurological:     General: No focal deficit present.     Mental Status: He is alert and oriented to person, place, and time.  Psychiatric:        Mood and Affect: Mood normal.        Behavior: Behavior normal.     Wt Readings from Last 3 Encounters:  02/07/24 175 lb (79.4 kg)  08/08/23 178 lb (80.7 kg)  07/09/23 179 lb 8 oz (81.4 kg)    BP 122/76   Pulse 97   Ht 5' 10 (1.778 m)   Wt 175 lb (79.4 kg)   SpO2 98%   BMI 25.11 kg/m   Assessment and Plan:  Problem List Items Addressed This Visit       Unprioritized   Type II diabetes mellitus with complication (HCC) - Primary (Chronic)   Currently medications are Janumet.  No hypoglycemic episodes noted. Home blood sugars in the 130 range. Last visit medical regimen changes were none. Recent A1C stable.  Continue current regimen. Lab Results  Component Value Date   HGBA1C 7.7 01/15/2024   HGBA1C 7.5 01/15/2024           Relevant Medications   rosuvastatin  (CRESTOR ) 40 MG tablet   Hyperlipidemia associated with type 2 diabetes mellitus (HCC) (Chronic)   LDL is  Lab Results  Component Value Date   LDLCALC 45 07/11/2023    Current medication regimen is Crestor  40 mg. Goal LDL is < 55.       Relevant Medications   rosuvastatin  (CRESTOR ) 40 MG tablet   Other Visit Diagnoses       Excessive cerumen in ear canal, right       schedule follow for ENT for cerumen, tinnitus and hearing loss     Long term current use of oral hypoglycemic drug          He plans to schedule appointment with new PCP closer to home in [place].  No follow-ups on file.    Leita HILARIO Adie, MD [facility] [place]

## 2024-02-07 NOTE — Assessment & Plan Note (Signed)
 LDL is  Lab Results  Component Value Date   LDLCALC 45 07/11/2023    Current medication regimen is Crestor  40 mg. Goal LDL is < 55.

## 2024-03-30 ENCOUNTER — Other Ambulatory Visit: Payer: Self-pay | Admitting: Internal Medicine

## 2024-03-30 DIAGNOSIS — E1169 Type 2 diabetes mellitus with other specified complication: Secondary | ICD-10-CM

## 2024-04-01 NOTE — Telephone Encounter (Signed)
 Requested Prescriptions  Pending Prescriptions Disp Refills   rosuvastatin  (CRESTOR ) 40 MG tablet [Pharmacy Med Name: ROSUVASTATIN  40MG  TABLETS] 90 tablet 0    Sig: TAKE 1 TABLET BY MOUTH EVERY DAY     Cardiovascular:  Antilipid - Statins 2 Failed - 04/01/2024 11:49 AM      Failed - Cr in normal range and within 360 days    Creatinine  Date Value Ref Range Status  07/11/2023 1.5 (A) 0.6 - 1.3 Final   Creatinine, Ser  Date Value Ref Range Status  11/21/2022 1.49 (H) 0.76 - 1.27 mg/dL Final         Failed - Lipid Panel in normal range within the last 12 months    Cholesterol, Total  Date Value Ref Range Status  11/21/2022 122 100 - 199 mg/dL Final   Cholesterol  Date Value Ref Range Status  07/11/2023 103 0 - 200 Final   LDL Chol Calc (NIH)  Date Value Ref Range Status  11/21/2022 58 0 - 99 mg/dL Final   LDL Cholesterol  Date Value Ref Range Status  07/11/2023 45  Final   HDL  Date Value Ref Range Status  07/11/2023 43 35 - 70 Final  11/21/2022 51 >39 mg/dL Final   Triglycerides  Date Value Ref Range Status  07/11/2023 57 40 - 160 Final         Passed - Patient is not pregnant      Passed - Valid encounter within last 12 months    Recent Outpatient Visits           1 month ago Type II diabetes mellitus with complication (HCC)   [place] [facility], Leita DEL, MD   7 months ago Type II diabetes mellitus with complication [facility])   [place] [facility] at [facility], Leita DEL, MD   8 months ago Submandibular lymphadenopathy   [facility] at [facility], Leita DEL, MD
